# Patient Record
Sex: Male | Born: 1945 | Race: White | Hispanic: No | Marital: Married | Smoking: Current every day smoker
Health system: Southern US, Community
[De-identification: ages and names within clinical notes are randomized; demographics above are authoritative.]

## PROBLEM LIST (undated history)

## (undated) DIAGNOSIS — C320 Malignant neoplasm of glottis: Secondary | ICD-10-CM

## (undated) DIAGNOSIS — J449 Chronic obstructive pulmonary disease, unspecified: Secondary | ICD-10-CM

## (undated) DIAGNOSIS — A419 Sepsis, unspecified organism: Secondary | ICD-10-CM

## (undated) DIAGNOSIS — M549 Dorsalgia, unspecified: Secondary | ICD-10-CM

## (undated) DIAGNOSIS — C61 Malignant neoplasm of prostate: Secondary | ICD-10-CM

## (undated) DIAGNOSIS — M1990 Unspecified osteoarthritis, unspecified site: Secondary | ICD-10-CM

## (undated) DIAGNOSIS — G8929 Other chronic pain: Secondary | ICD-10-CM

## (undated) DIAGNOSIS — N390 Urinary tract infection, site not specified: Secondary | ICD-10-CM

## (undated) DIAGNOSIS — D751 Secondary polycythemia: Secondary | ICD-10-CM

## (undated) DIAGNOSIS — N200 Calculus of kidney: Secondary | ICD-10-CM

## (undated) HISTORY — PX: APPENDECTOMY: SHX54

## (undated) HISTORY — DX: Malignant neoplasm of glottis: C32.0

## (undated) HISTORY — PX: HERNIA REPAIR: SHX51

## (undated) HISTORY — PX: BACK SURGERY: SHX140

## (undated) HISTORY — DX: Malignant neoplasm of prostate: C61

## (undated) HISTORY — PX: NEPHRECTOMY: SHX65

## (undated) HISTORY — DX: Secondary polycythemia: D75.1

---

## 2013-04-14 DIAGNOSIS — D485 Neoplasm of uncertain behavior of skin: Secondary | ICD-10-CM | POA: Diagnosis not present

## 2013-04-14 DIAGNOSIS — L570 Actinic keratosis: Secondary | ICD-10-CM | POA: Diagnosis not present

## 2013-04-14 DIAGNOSIS — C44221 Squamous cell carcinoma of skin of unspecified ear and external auricular canal: Secondary | ICD-10-CM | POA: Diagnosis not present

## 2013-05-06 DIAGNOSIS — M48061 Spinal stenosis, lumbar region without neurogenic claudication: Secondary | ICD-10-CM | POA: Diagnosis not present

## 2013-05-06 DIAGNOSIS — R262 Difficulty in walking, not elsewhere classified: Secondary | ICD-10-CM | POA: Diagnosis not present

## 2013-05-06 DIAGNOSIS — N390 Urinary tract infection, site not specified: Secondary | ICD-10-CM | POA: Diagnosis not present

## 2013-05-06 DIAGNOSIS — M169 Osteoarthritis of hip, unspecified: Secondary | ICD-10-CM | POA: Diagnosis not present

## 2013-05-06 DIAGNOSIS — M25559 Pain in unspecified hip: Secondary | ICD-10-CM | POA: Diagnosis not present

## 2013-05-06 DIAGNOSIS — M5126 Other intervertebral disc displacement, lumbar region: Secondary | ICD-10-CM | POA: Diagnosis not present

## 2013-05-06 DIAGNOSIS — R079 Chest pain, unspecified: Secondary | ICD-10-CM | POA: Diagnosis not present

## 2013-05-07 DIAGNOSIS — R079 Chest pain, unspecified: Secondary | ICD-10-CM | POA: Diagnosis not present

## 2013-05-07 DIAGNOSIS — M25559 Pain in unspecified hip: Secondary | ICD-10-CM | POA: Diagnosis not present

## 2013-05-07 DIAGNOSIS — M48061 Spinal stenosis, lumbar region without neurogenic claudication: Secondary | ICD-10-CM | POA: Diagnosis not present

## 2013-05-07 DIAGNOSIS — M5126 Other intervertebral disc displacement, lumbar region: Secondary | ICD-10-CM | POA: Diagnosis not present

## 2013-05-07 DIAGNOSIS — N390 Urinary tract infection, site not specified: Secondary | ICD-10-CM | POA: Diagnosis not present

## 2013-05-13 DIAGNOSIS — N390 Urinary tract infection, site not specified: Secondary | ICD-10-CM | POA: Diagnosis not present

## 2013-05-13 DIAGNOSIS — R498 Other voice and resonance disorders: Secondary | ICD-10-CM | POA: Diagnosis not present

## 2013-05-13 DIAGNOSIS — M545 Low back pain: Secondary | ICD-10-CM | POA: Diagnosis not present

## 2013-05-25 DIAGNOSIS — M5137 Other intervertebral disc degeneration, lumbosacral region: Secondary | ICD-10-CM | POA: Diagnosis not present

## 2013-05-25 DIAGNOSIS — M48061 Spinal stenosis, lumbar region without neurogenic claudication: Secondary | ICD-10-CM | POA: Diagnosis not present

## 2013-05-25 DIAGNOSIS — M5380 Other specified dorsopathies, site unspecified: Secondary | ICD-10-CM | POA: Diagnosis not present

## 2013-05-25 DIAGNOSIS — M47817 Spondylosis without myelopathy or radiculopathy, lumbosacral region: Secondary | ICD-10-CM | POA: Diagnosis not present

## 2013-05-27 DIAGNOSIS — F17200 Nicotine dependence, unspecified, uncomplicated: Secondary | ICD-10-CM | POA: Diagnosis not present

## 2013-05-27 DIAGNOSIS — M5137 Other intervertebral disc degeneration, lumbosacral region: Secondary | ICD-10-CM | POA: Diagnosis not present

## 2013-05-27 DIAGNOSIS — Z79899 Other long term (current) drug therapy: Secondary | ICD-10-CM | POA: Diagnosis not present

## 2013-05-27 DIAGNOSIS — M47817 Spondylosis without myelopathy or radiculopathy, lumbosacral region: Secondary | ICD-10-CM | POA: Diagnosis not present

## 2013-05-27 DIAGNOSIS — M5380 Other specified dorsopathies, site unspecified: Secondary | ICD-10-CM | POA: Diagnosis not present

## 2013-06-03 DIAGNOSIS — M47817 Spondylosis without myelopathy or radiculopathy, lumbosacral region: Secondary | ICD-10-CM | POA: Diagnosis not present

## 2013-06-03 DIAGNOSIS — M5137 Other intervertebral disc degeneration, lumbosacral region: Secondary | ICD-10-CM | POA: Diagnosis not present

## 2013-06-08 DIAGNOSIS — M1990 Unspecified osteoarthritis, unspecified site: Secondary | ICD-10-CM | POA: Diagnosis not present

## 2013-06-08 DIAGNOSIS — G4700 Insomnia, unspecified: Secondary | ICD-10-CM | POA: Diagnosis not present

## 2013-06-08 DIAGNOSIS — M545 Low back pain: Secondary | ICD-10-CM | POA: Diagnosis not present

## 2013-06-22 DIAGNOSIS — M5137 Other intervertebral disc degeneration, lumbosacral region: Secondary | ICD-10-CM | POA: Diagnosis not present

## 2013-06-22 DIAGNOSIS — M47817 Spondylosis without myelopathy or radiculopathy, lumbosacral region: Secondary | ICD-10-CM | POA: Diagnosis not present

## 2013-06-22 DIAGNOSIS — F17200 Nicotine dependence, unspecified, uncomplicated: Secondary | ICD-10-CM | POA: Diagnosis not present

## 2013-06-22 DIAGNOSIS — Z79899 Other long term (current) drug therapy: Secondary | ICD-10-CM | POA: Diagnosis not present

## 2013-06-22 DIAGNOSIS — M51379 Other intervertebral disc degeneration, lumbosacral region without mention of lumbar back pain or lower extremity pain: Secondary | ICD-10-CM | POA: Diagnosis not present

## 2013-07-07 DIAGNOSIS — M47817 Spondylosis without myelopathy or radiculopathy, lumbosacral region: Secondary | ICD-10-CM | POA: Diagnosis not present

## 2013-07-07 DIAGNOSIS — M48061 Spinal stenosis, lumbar region without neurogenic claudication: Secondary | ICD-10-CM | POA: Diagnosis not present

## 2013-07-07 DIAGNOSIS — M5137 Other intervertebral disc degeneration, lumbosacral region: Secondary | ICD-10-CM | POA: Diagnosis not present

## 2013-07-07 DIAGNOSIS — M5380 Other specified dorsopathies, site unspecified: Secondary | ICD-10-CM | POA: Diagnosis not present

## 2013-07-23 DIAGNOSIS — M47817 Spondylosis without myelopathy or radiculopathy, lumbosacral region: Secondary | ICD-10-CM | POA: Diagnosis not present

## 2013-08-02 DIAGNOSIS — R69 Illness, unspecified: Secondary | ICD-10-CM | POA: Diagnosis not present

## 2013-08-02 DIAGNOSIS — M47817 Spondylosis without myelopathy or radiculopathy, lumbosacral region: Secondary | ICD-10-CM | POA: Diagnosis not present

## 2013-08-02 DIAGNOSIS — J984 Other disorders of lung: Secondary | ICD-10-CM | POA: Diagnosis not present

## 2013-08-02 DIAGNOSIS — Z01818 Encounter for other preprocedural examination: Secondary | ICD-10-CM | POA: Diagnosis not present

## 2013-08-02 DIAGNOSIS — Z538 Procedure and treatment not carried out for other reasons: Secondary | ICD-10-CM | POA: Diagnosis not present

## 2013-08-09 DIAGNOSIS — M545 Low back pain: Secondary | ICD-10-CM | POA: Diagnosis not present

## 2013-08-09 DIAGNOSIS — IMO0002 Reserved for concepts with insufficient information to code with codable children: Secondary | ICD-10-CM | POA: Diagnosis not present

## 2013-08-09 DIAGNOSIS — E860 Dehydration: Secondary | ICD-10-CM | POA: Diagnosis not present

## 2013-08-09 DIAGNOSIS — R627 Adult failure to thrive: Secondary | ICD-10-CM | POA: Diagnosis not present

## 2013-08-09 DIAGNOSIS — M549 Dorsalgia, unspecified: Secondary | ICD-10-CM | POA: Diagnosis not present

## 2013-08-09 DIAGNOSIS — Z79899 Other long term (current) drug therapy: Secondary | ICD-10-CM | POA: Diagnosis not present

## 2013-08-09 DIAGNOSIS — N390 Urinary tract infection, site not specified: Secondary | ICD-10-CM | POA: Diagnosis not present

## 2013-08-09 DIAGNOSIS — F17200 Nicotine dependence, unspecified, uncomplicated: Secondary | ICD-10-CM | POA: Diagnosis present

## 2013-08-09 DIAGNOSIS — I498 Other specified cardiac arrhythmias: Secondary | ICD-10-CM | POA: Diagnosis not present

## 2013-08-09 DIAGNOSIS — R5381 Other malaise: Secondary | ICD-10-CM | POA: Diagnosis not present

## 2013-08-09 DIAGNOSIS — Q043 Other reduction deformities of brain: Secondary | ICD-10-CM | POA: Diagnosis not present

## 2013-08-09 DIAGNOSIS — R17 Unspecified jaundice: Secondary | ICD-10-CM | POA: Diagnosis not present

## 2013-08-11 DIAGNOSIS — N476 Balanoposthitis: Secondary | ICD-10-CM | POA: Diagnosis not present

## 2013-08-11 DIAGNOSIS — N390 Urinary tract infection, site not specified: Secondary | ICD-10-CM | POA: Diagnosis not present

## 2013-08-27 DIAGNOSIS — N390 Urinary tract infection, site not specified: Secondary | ICD-10-CM | POA: Diagnosis not present

## 2013-08-27 DIAGNOSIS — Z23 Encounter for immunization: Secondary | ICD-10-CM | POA: Diagnosis not present

## 2013-08-27 DIAGNOSIS — J449 Chronic obstructive pulmonary disease, unspecified: Secondary | ICD-10-CM | POA: Diagnosis not present

## 2013-08-27 DIAGNOSIS — M545 Low back pain: Secondary | ICD-10-CM | POA: Diagnosis not present

## 2013-08-31 DIAGNOSIS — I059 Rheumatic mitral valve disease, unspecified: Secondary | ICD-10-CM | POA: Diagnosis not present

## 2013-08-31 DIAGNOSIS — Z01810 Encounter for preprocedural cardiovascular examination: Secondary | ICD-10-CM | POA: Diagnosis not present

## 2013-08-31 DIAGNOSIS — R9431 Abnormal electrocardiogram [ECG] [EKG]: Secondary | ICD-10-CM | POA: Diagnosis not present

## 2013-09-17 DIAGNOSIS — N476 Balanoposthitis: Secondary | ICD-10-CM | POA: Diagnosis not present

## 2013-09-17 DIAGNOSIS — R3915 Urgency of urination: Secondary | ICD-10-CM | POA: Diagnosis not present

## 2013-09-17 DIAGNOSIS — N390 Urinary tract infection, site not specified: Secondary | ICD-10-CM | POA: Diagnosis not present

## 2013-10-01 DIAGNOSIS — R3915 Urgency of urination: Secondary | ICD-10-CM | POA: Diagnosis not present

## 2013-10-08 DIAGNOSIS — R3915 Urgency of urination: Secondary | ICD-10-CM | POA: Diagnosis not present

## 2013-10-13 DIAGNOSIS — M47817 Spondylosis without myelopathy or radiculopathy, lumbosacral region: Secondary | ICD-10-CM | POA: Diagnosis not present

## 2013-10-13 DIAGNOSIS — M48061 Spinal stenosis, lumbar region without neurogenic claudication: Secondary | ICD-10-CM | POA: Diagnosis not present

## 2013-11-01 DIAGNOSIS — M519 Unspecified thoracic, thoracolumbar and lumbosacral intervertebral disc disorder: Secondary | ICD-10-CM | POA: Diagnosis not present

## 2013-11-01 DIAGNOSIS — Z01810 Encounter for preprocedural cardiovascular examination: Secondary | ICD-10-CM | POA: Diagnosis not present

## 2013-11-01 DIAGNOSIS — Z79899 Other long term (current) drug therapy: Secondary | ICD-10-CM | POA: Diagnosis not present

## 2013-11-01 DIAGNOSIS — F17200 Nicotine dependence, unspecified, uncomplicated: Secondary | ICD-10-CM | POA: Diagnosis not present

## 2013-11-01 DIAGNOSIS — N390 Urinary tract infection, site not specified: Secondary | ICD-10-CM | POA: Diagnosis not present

## 2013-11-01 DIAGNOSIS — M48061 Spinal stenosis, lumbar region without neurogenic claudication: Secondary | ICD-10-CM | POA: Diagnosis not present

## 2013-11-01 DIAGNOSIS — Z01818 Encounter for other preprocedural examination: Secondary | ICD-10-CM | POA: Diagnosis not present

## 2013-11-01 DIAGNOSIS — M47817 Spondylosis without myelopathy or radiculopathy, lumbosacral region: Secondary | ICD-10-CM | POA: Diagnosis not present

## 2013-11-01 DIAGNOSIS — M129 Arthropathy, unspecified: Secondary | ICD-10-CM | POA: Diagnosis not present

## 2013-11-04 DIAGNOSIS — M519 Unspecified thoracic, thoracolumbar and lumbosacral intervertebral disc disorder: Secondary | ICD-10-CM | POA: Diagnosis not present

## 2013-11-04 DIAGNOSIS — M48061 Spinal stenosis, lumbar region without neurogenic claudication: Secondary | ICD-10-CM | POA: Diagnosis not present

## 2013-11-04 DIAGNOSIS — M47817 Spondylosis without myelopathy or radiculopathy, lumbosacral region: Secondary | ICD-10-CM | POA: Diagnosis not present

## 2013-12-10 DIAGNOSIS — M1990 Unspecified osteoarthritis, unspecified site: Secondary | ICD-10-CM | POA: Diagnosis not present

## 2013-12-10 DIAGNOSIS — M545 Low back pain, unspecified: Secondary | ICD-10-CM | POA: Diagnosis not present

## 2013-12-10 DIAGNOSIS — N390 Urinary tract infection, site not specified: Secondary | ICD-10-CM | POA: Diagnosis not present

## 2014-02-14 DIAGNOSIS — R300 Dysuria: Secondary | ICD-10-CM | POA: Diagnosis not present

## 2014-02-14 DIAGNOSIS — Z87440 Personal history of urinary (tract) infections: Secondary | ICD-10-CM | POA: Diagnosis not present

## 2014-02-15 DIAGNOSIS — R300 Dysuria: Secondary | ICD-10-CM | POA: Diagnosis not present

## 2014-03-07 DIAGNOSIS — N390 Urinary tract infection, site not specified: Secondary | ICD-10-CM | POA: Diagnosis not present

## 2014-03-07 DIAGNOSIS — R3915 Urgency of urination: Secondary | ICD-10-CM | POA: Diagnosis not present

## 2014-03-08 DIAGNOSIS — R3915 Urgency of urination: Secondary | ICD-10-CM | POA: Diagnosis not present

## 2014-03-18 DIAGNOSIS — N390 Urinary tract infection, site not specified: Secondary | ICD-10-CM | POA: Diagnosis not present

## 2014-03-18 DIAGNOSIS — N200 Calculus of kidney: Secondary | ICD-10-CM | POA: Diagnosis not present

## 2014-04-06 DIAGNOSIS — R3915 Urgency of urination: Secondary | ICD-10-CM | POA: Diagnosis not present

## 2014-04-06 DIAGNOSIS — N200 Calculus of kidney: Secondary | ICD-10-CM | POA: Diagnosis not present

## 2014-04-06 DIAGNOSIS — Z87440 Personal history of urinary (tract) infections: Secondary | ICD-10-CM | POA: Diagnosis not present

## 2014-04-07 DIAGNOSIS — I714 Abdominal aortic aneurysm, without rupture, unspecified: Secondary | ICD-10-CM | POA: Diagnosis not present

## 2014-04-07 DIAGNOSIS — F17200 Nicotine dependence, unspecified, uncomplicated: Secondary | ICD-10-CM | POA: Diagnosis not present

## 2014-04-07 DIAGNOSIS — Z87440 Personal history of urinary (tract) infections: Secondary | ICD-10-CM | POA: Diagnosis not present

## 2014-04-07 DIAGNOSIS — Z8052 Family history of malignant neoplasm of bladder: Secondary | ICD-10-CM | POA: Diagnosis not present

## 2014-04-07 DIAGNOSIS — Z833 Family history of diabetes mellitus: Secondary | ICD-10-CM | POA: Diagnosis not present

## 2014-04-07 DIAGNOSIS — N200 Calculus of kidney: Secondary | ICD-10-CM | POA: Diagnosis not present

## 2014-04-07 DIAGNOSIS — R3915 Urgency of urination: Secondary | ICD-10-CM | POA: Diagnosis not present

## 2014-04-07 DIAGNOSIS — Z8051 Family history of malignant neoplasm of kidney: Secondary | ICD-10-CM | POA: Diagnosis not present

## 2014-04-08 DIAGNOSIS — N200 Calculus of kidney: Secondary | ICD-10-CM | POA: Diagnosis not present

## 2014-04-18 DIAGNOSIS — Z87440 Personal history of urinary (tract) infections: Secondary | ICD-10-CM | POA: Diagnosis not present

## 2014-04-18 DIAGNOSIS — J449 Chronic obstructive pulmonary disease, unspecified: Secondary | ICD-10-CM | POA: Diagnosis not present

## 2014-04-18 DIAGNOSIS — N200 Calculus of kidney: Secondary | ICD-10-CM | POA: Diagnosis not present

## 2014-04-18 DIAGNOSIS — N189 Chronic kidney disease, unspecified: Secondary | ICD-10-CM | POA: Diagnosis not present

## 2014-04-18 DIAGNOSIS — IMO0002 Reserved for concepts with insufficient information to code with codable children: Secondary | ICD-10-CM | POA: Diagnosis not present

## 2014-04-21 DIAGNOSIS — Z87440 Personal history of urinary (tract) infections: Secondary | ICD-10-CM | POA: Diagnosis not present

## 2014-04-21 DIAGNOSIS — IMO0002 Reserved for concepts with insufficient information to code with codable children: Secondary | ICD-10-CM | POA: Diagnosis not present

## 2014-04-21 DIAGNOSIS — J449 Chronic obstructive pulmonary disease, unspecified: Secondary | ICD-10-CM | POA: Diagnosis not present

## 2014-04-21 DIAGNOSIS — N189 Chronic kidney disease, unspecified: Secondary | ICD-10-CM | POA: Diagnosis not present

## 2014-04-21 DIAGNOSIS — N200 Calculus of kidney: Secondary | ICD-10-CM | POA: Diagnosis not present

## 2014-04-22 DIAGNOSIS — IMO0002 Reserved for concepts with insufficient information to code with codable children: Secondary | ICD-10-CM | POA: Diagnosis not present

## 2014-04-22 DIAGNOSIS — N189 Chronic kidney disease, unspecified: Secondary | ICD-10-CM | POA: Diagnosis not present

## 2014-04-22 DIAGNOSIS — J449 Chronic obstructive pulmonary disease, unspecified: Secondary | ICD-10-CM | POA: Diagnosis not present

## 2014-04-22 DIAGNOSIS — Z87440 Personal history of urinary (tract) infections: Secondary | ICD-10-CM | POA: Diagnosis not present

## 2014-04-22 DIAGNOSIS — N200 Calculus of kidney: Secondary | ICD-10-CM | POA: Diagnosis not present

## 2014-04-27 DIAGNOSIS — M47817 Spondylosis without myelopathy or radiculopathy, lumbosacral region: Secondary | ICD-10-CM | POA: Diagnosis not present

## 2014-04-27 DIAGNOSIS — M48061 Spinal stenosis, lumbar region without neurogenic claudication: Secondary | ICD-10-CM | POA: Diagnosis not present

## 2014-05-10 DIAGNOSIS — N289 Disorder of kidney and ureter, unspecified: Secondary | ICD-10-CM | POA: Diagnosis not present

## 2014-05-10 DIAGNOSIS — Z87440 Personal history of urinary (tract) infections: Secondary | ICD-10-CM | POA: Diagnosis not present

## 2014-05-10 DIAGNOSIS — N270 Small kidney, unilateral: Secondary | ICD-10-CM | POA: Diagnosis present

## 2014-05-10 DIAGNOSIS — F17200 Nicotine dependence, unspecified, uncomplicated: Secondary | ICD-10-CM | POA: Diagnosis present

## 2014-05-10 DIAGNOSIS — N189 Chronic kidney disease, unspecified: Secondary | ICD-10-CM | POA: Diagnosis present

## 2014-05-10 DIAGNOSIS — Z87442 Personal history of urinary calculi: Secondary | ICD-10-CM | POA: Diagnosis not present

## 2014-05-10 DIAGNOSIS — N200 Calculus of kidney: Secondary | ICD-10-CM | POA: Diagnosis not present

## 2014-06-08 DIAGNOSIS — N151 Renal and perinephric abscess: Secondary | ICD-10-CM | POA: Diagnosis not present

## 2014-06-08 DIAGNOSIS — N200 Calculus of kidney: Secondary | ICD-10-CM | POA: Diagnosis not present

## 2014-06-08 DIAGNOSIS — Z48816 Encounter for surgical aftercare following surgery on the genitourinary system: Secondary | ICD-10-CM | POA: Diagnosis not present

## 2014-06-24 DIAGNOSIS — N390 Urinary tract infection, site not specified: Secondary | ICD-10-CM | POA: Diagnosis not present

## 2014-06-24 DIAGNOSIS — F411 Generalized anxiety disorder: Secondary | ICD-10-CM | POA: Diagnosis not present

## 2014-06-24 DIAGNOSIS — M545 Low back pain, unspecified: Secondary | ICD-10-CM | POA: Diagnosis not present

## 2014-08-08 DIAGNOSIS — N200 Calculus of kidney: Secondary | ICD-10-CM | POA: Diagnosis not present

## 2014-08-08 DIAGNOSIS — N289 Disorder of kidney and ureter, unspecified: Secondary | ICD-10-CM | POA: Diagnosis not present

## 2014-08-15 DIAGNOSIS — M4806 Spinal stenosis, lumbar region: Secondary | ICD-10-CM | POA: Diagnosis not present

## 2014-08-15 DIAGNOSIS — M47816 Spondylosis without myelopathy or radiculopathy, lumbar region: Secondary | ICD-10-CM | POA: Diagnosis not present

## 2014-09-09 DIAGNOSIS — J449 Chronic obstructive pulmonary disease, unspecified: Secondary | ICD-10-CM | POA: Diagnosis not present

## 2014-09-09 DIAGNOSIS — F419 Anxiety disorder, unspecified: Secondary | ICD-10-CM | POA: Diagnosis not present

## 2014-09-09 DIAGNOSIS — M545 Low back pain: Secondary | ICD-10-CM | POA: Diagnosis not present

## 2014-09-09 DIAGNOSIS — Z23 Encounter for immunization: Secondary | ICD-10-CM | POA: Diagnosis not present

## 2014-09-09 DIAGNOSIS — M159 Polyosteoarthritis, unspecified: Secondary | ICD-10-CM | POA: Diagnosis not present

## 2014-09-15 ENCOUNTER — Emergency Department (HOSPITAL_COMMUNITY): Payer: Medicare Other

## 2014-09-15 ENCOUNTER — Inpatient Hospital Stay (HOSPITAL_COMMUNITY): Payer: Medicare Other

## 2014-09-15 ENCOUNTER — Encounter (HOSPITAL_COMMUNITY): Payer: Self-pay

## 2014-09-15 ENCOUNTER — Inpatient Hospital Stay (HOSPITAL_COMMUNITY)
Admission: EM | Admit: 2014-09-15 | Discharge: 2014-09-20 | DRG: 871 | Disposition: A | Discharge status: Home / Self Care | Payer: Medicare Other | Attending: Pulmonary Disease | Admitting: Pulmonary Disease

## 2014-09-15 DIAGNOSIS — A419 Sepsis, unspecified organism: Principal | ICD-10-CM | POA: Diagnosis present

## 2014-09-15 DIAGNOSIS — Z87442 Personal history of urinary calculi: Secondary | ICD-10-CM

## 2014-09-15 DIAGNOSIS — G8929 Other chronic pain: Secondary | ICD-10-CM | POA: Diagnosis present

## 2014-09-15 DIAGNOSIS — N179 Acute kidney failure, unspecified: Secondary | ICD-10-CM

## 2014-09-15 DIAGNOSIS — B9620 Unspecified Escherichia coli [E. coli] as the cause of diseases classified elsewhere: Secondary | ICD-10-CM | POA: Diagnosis present

## 2014-09-15 DIAGNOSIS — R410 Disorientation, unspecified: Secondary | ICD-10-CM

## 2014-09-15 DIAGNOSIS — I369 Nonrheumatic tricuspid valve disorder, unspecified: Secondary | ICD-10-CM | POA: Diagnosis not present

## 2014-09-15 DIAGNOSIS — Z905 Acquired absence of kidney: Secondary | ICD-10-CM | POA: Diagnosis present

## 2014-09-15 DIAGNOSIS — G9340 Encephalopathy, unspecified: Secondary | ICD-10-CM | POA: Diagnosis not present

## 2014-09-15 DIAGNOSIS — G9341 Metabolic encephalopathy: Secondary | ICD-10-CM | POA: Diagnosis present

## 2014-09-15 DIAGNOSIS — J811 Chronic pulmonary edema: Secondary | ICD-10-CM | POA: Diagnosis not present

## 2014-09-15 DIAGNOSIS — E860 Dehydration: Secondary | ICD-10-CM | POA: Diagnosis not present

## 2014-09-15 DIAGNOSIS — E871 Hypo-osmolality and hyponatremia: Secondary | ICD-10-CM | POA: Diagnosis present

## 2014-09-15 DIAGNOSIS — R509 Fever, unspecified: Secondary | ICD-10-CM | POA: Diagnosis not present

## 2014-09-15 DIAGNOSIS — Z87440 Personal history of urinary (tract) infections: Secondary | ICD-10-CM | POA: Diagnosis not present

## 2014-09-15 DIAGNOSIS — D696 Thrombocytopenia, unspecified: Secondary | ICD-10-CM | POA: Diagnosis present

## 2014-09-15 DIAGNOSIS — I214 Non-ST elevation (NSTEMI) myocardial infarction: Secondary | ICD-10-CM | POA: Diagnosis present

## 2014-09-15 DIAGNOSIS — Z79899 Other long term (current) drug therapy: Secondary | ICD-10-CM

## 2014-09-15 DIAGNOSIS — I2129 ST elevation (STEMI) myocardial infarction involving other sites: Secondary | ICD-10-CM | POA: Diagnosis not present

## 2014-09-15 DIAGNOSIS — R531 Weakness: Secondary | ICD-10-CM | POA: Diagnosis not present

## 2014-09-15 DIAGNOSIS — J449 Chronic obstructive pulmonary disease, unspecified: Secondary | ICD-10-CM | POA: Diagnosis present

## 2014-09-15 DIAGNOSIS — A4151 Sepsis due to Escherichia coli [E. coli]: Secondary | ICD-10-CM

## 2014-09-15 DIAGNOSIS — N400 Enlarged prostate without lower urinary tract symptoms: Secondary | ICD-10-CM | POA: Diagnosis present

## 2014-09-15 DIAGNOSIS — I129 Hypertensive chronic kidney disease with stage 1 through stage 4 chronic kidney disease, or unspecified chronic kidney disease: Secondary | ICD-10-CM | POA: Diagnosis present

## 2014-09-15 DIAGNOSIS — F4542 Pain disorder with related psychological factors: Secondary | ICD-10-CM | POA: Diagnosis present

## 2014-09-15 DIAGNOSIS — N200 Calculus of kidney: Secondary | ICD-10-CM | POA: Diagnosis present

## 2014-09-15 DIAGNOSIS — M549 Dorsalgia, unspecified: Secondary | ICD-10-CM | POA: Diagnosis present

## 2014-09-15 DIAGNOSIS — D649 Anemia, unspecified: Secondary | ICD-10-CM | POA: Diagnosis not present

## 2014-09-15 DIAGNOSIS — R778 Other specified abnormalities of plasma proteins: Secondary | ICD-10-CM | POA: Diagnosis present

## 2014-09-15 DIAGNOSIS — N390 Urinary tract infection, site not specified: Secondary | ICD-10-CM | POA: Diagnosis not present

## 2014-09-15 DIAGNOSIS — I248 Other forms of acute ischemic heart disease: Secondary | ICD-10-CM | POA: Diagnosis present

## 2014-09-15 DIAGNOSIS — E875 Hyperkalemia: Secondary | ICD-10-CM | POA: Diagnosis present

## 2014-09-15 DIAGNOSIS — N19 Unspecified kidney failure: Secondary | ICD-10-CM | POA: Diagnosis not present

## 2014-09-15 DIAGNOSIS — N183 Chronic kidney disease, stage 3 (moderate): Secondary | ICD-10-CM | POA: Diagnosis present

## 2014-09-15 DIAGNOSIS — F17210 Nicotine dependence, cigarettes, uncomplicated: Secondary | ICD-10-CM | POA: Diagnosis present

## 2014-09-15 DIAGNOSIS — R7989 Other specified abnormal findings of blood chemistry: Secondary | ICD-10-CM

## 2014-09-15 HISTORY — DX: Other chronic pain: G89.29

## 2014-09-15 HISTORY — DX: Urinary tract infection, site not specified: N39.0

## 2014-09-15 HISTORY — DX: Calculus of kidney: N20.0

## 2014-09-15 HISTORY — DX: Sepsis, unspecified organism: A41.9

## 2014-09-15 HISTORY — DX: Dorsalgia, unspecified: M54.9

## 2014-09-15 HISTORY — DX: Hypercalcemia: E83.52

## 2014-09-15 LAB — RAPID URINE DRUG SCREEN, HOSP PERFORMED
Amphetamines: NOT DETECTED
BARBITURATES: NOT DETECTED
Benzodiazepines: POSITIVE — AB
COCAINE: NOT DETECTED
Opiates: NOT DETECTED
Tetrahydrocannabinol: NOT DETECTED

## 2014-09-15 LAB — BASIC METABOLIC PANEL
Anion gap: 14 (ref 5–15)
BUN: 64 mg/dL — AB (ref 6–23)
CHLORIDE: 101 meq/L (ref 96–112)
CO2: 21 mEq/L (ref 19–32)
Calcium: 10.8 mg/dL — ABNORMAL HIGH (ref 8.4–10.5)
Creatinine, Ser: 4.63 mg/dL — ABNORMAL HIGH (ref 0.50–1.35)
GFR calc Af Amer: 14 mL/min — ABNORMAL LOW (ref >90)
GFR calc non Af Amer: 12 mL/min — ABNORMAL LOW (ref >90)
GLUCOSE: 112 mg/dL — AB (ref 70–99)
POTASSIUM: 5 meq/L (ref 3.7–5.3)
SODIUM: 136 meq/L — AB (ref 137–147)

## 2014-09-15 LAB — TROPONIN I
TROPONIN I: 0.81 ng/mL — AB (ref <0.30)
TROPONIN I: 2.06 ng/mL — AB (ref <0.30)
Troponin I: 1.73 ng/mL (ref <0.30)

## 2014-09-15 LAB — CBC WITH DIFFERENTIAL/PLATELET
BASOS PCT: 0 % (ref 0–1)
Basophils Absolute: 0 10*3/uL (ref 0.0–0.1)
Eosinophils Absolute: 0 10*3/uL (ref 0.0–0.7)
Eosinophils Relative: 0 % (ref 0–5)
HEMATOCRIT: 39.1 % (ref 39.0–52.0)
Hemoglobin: 13.8 g/dL (ref 13.0–17.0)
LYMPHS ABS: 0.2 10*3/uL — AB (ref 0.7–4.0)
LYMPHS PCT: 2 % — AB (ref 12–46)
MCH: 30.7 pg (ref 26.0–34.0)
MCHC: 35.3 g/dL (ref 30.0–36.0)
MCV: 86.9 fL (ref 78.0–100.0)
MONO ABS: 0.3 10*3/uL (ref 0.1–1.0)
MONOS PCT: 4 % (ref 3–12)
NEUTROS PCT: 94 % — AB (ref 43–77)
Neutro Abs: 7.1 10*3/uL (ref 1.7–7.7)
Platelets: 61 10*3/uL — ABNORMAL LOW (ref 150–400)
RBC: 4.5 MIL/uL (ref 4.22–5.81)
RDW: 14.5 % (ref 11.5–15.5)
Smear Review: DECREASED
WBC: 7.5 10*3/uL (ref 4.0–10.5)

## 2014-09-15 LAB — URINALYSIS, ROUTINE W REFLEX MICROSCOPIC
Glucose, UA: NEGATIVE mg/dL
KETONES UR: NEGATIVE mg/dL
NITRITE: NEGATIVE
Protein, ur: 100 mg/dL — AB
SPECIFIC GRAVITY, URINE: 1.02 (ref 1.005–1.030)
Urobilinogen, UA: 1 mg/dL (ref 0.0–1.0)
pH: 5.5 (ref 5.0–8.0)

## 2014-09-15 LAB — TSH: TSH: 1.03 u[IU]/mL (ref 0.350–4.500)

## 2014-09-15 LAB — LACTIC ACID, PLASMA: LACTIC ACID, VENOUS: 1.4 mmol/L (ref 0.5–2.2)

## 2014-09-15 LAB — URINE MICROSCOPIC-ADD ON

## 2014-09-15 LAB — MRSA PCR SCREENING: MRSA BY PCR: POSITIVE — AB

## 2014-09-15 MED ORDER — SODIUM CHLORIDE 0.9 % IV SOLN
INTRAVENOUS | Status: DC
  Administered 2014-09-15: 10:00:00 via INTRAVENOUS

## 2014-09-15 MED ORDER — ACETAMINOPHEN 650 MG RE SUPP: 650.0000 mg | Freq: Four times a day (QID) | RECTAL | Status: DC | PRN

## 2014-09-15 MED ORDER — DEXTROSE 5 % IV SOLN
1.0000 g | Freq: Once | INTRAVENOUS | Status: AC
  Administered 2014-09-15: 1 g via INTRAVENOUS
  Filled 2014-09-15: qty 10

## 2014-09-15 MED ORDER — DOCUSATE SODIUM 100 MG PO CAPS
100.0000 mg | ORAL_CAPSULE | Freq: Two times a day (BID) | ORAL | Status: DC
  Administered 2014-09-15 – 2014-09-20 (×8): 100 mg via ORAL
  Filled 2014-09-15 (×8): qty 1

## 2014-09-15 MED ORDER — ALUM & MAG HYDROXIDE-SIMETH 200-200-20 MG/5ML PO SUSP
30.0000 mL | Freq: Four times a day (QID) | ORAL | Status: DC | PRN
  Administered 2014-09-16 (×2): 30 mL via ORAL
  Filled 2014-09-15 (×2): qty 30

## 2014-09-15 MED ORDER — PIPERACILLIN-TAZOBACTAM IN DEX 2-0.25 GM/50ML IV SOLN
2.2500 g | Freq: Three times a day (TID) | INTRAVENOUS | Status: DC
  Administered 2014-09-15 – 2014-09-17 (×6): 2.25 g via INTRAVENOUS
  Filled 2014-09-15 (×13): qty 50

## 2014-09-15 MED ORDER — SODIUM CHLORIDE 0.9 % IV BOLUS (SEPSIS)
250.0000 mL | Freq: Once | INTRAVENOUS | Status: AC
  Administered 2014-09-15: 250 mL via INTRAVENOUS

## 2014-09-15 MED ORDER — TAMSULOSIN HCL 0.4 MG PO CAPS
0.4000 mg | ORAL_CAPSULE | Freq: Every day | ORAL | Status: DC
  Administered 2014-09-15 – 2014-09-20 (×6): 0.4 mg via ORAL
  Filled 2014-09-15 (×6): qty 1

## 2014-09-15 MED ORDER — OXYCODONE HCL 5 MG PO TABS
10.0000 mg | ORAL_TABLET | Freq: Two times a day (BID) | ORAL | Status: DC | PRN
  Administered 2014-09-15 – 2014-09-17 (×4): 10 mg via ORAL
  Filled 2014-09-15 (×5): qty 2

## 2014-09-15 MED ORDER — BISACODYL 10 MG RE SUPP: 10.0000 mg | Freq: Every day | RECTAL | Status: DC | PRN

## 2014-09-15 MED ORDER — CETYLPYRIDINIUM CHLORIDE 0.05 % MT LIQD
7.0000 mL | Freq: Two times a day (BID) | OROMUCOSAL | Status: DC
  Administered 2014-09-15 – 2014-09-20 (×10): 7 mL via OROMUCOSAL

## 2014-09-15 MED ORDER — ONDANSETRON HCL 4 MG/2ML IJ SOLN: 4.0000 mg | Freq: Four times a day (QID) | INTRAMUSCULAR | Status: DC | PRN

## 2014-09-15 MED ORDER — CHLORHEXIDINE GLUCONATE CLOTH 2 % EX PADS
6.0000 | MEDICATED_PAD | Freq: Every day | CUTANEOUS | Status: AC
  Administered 2014-09-16 – 2014-09-20 (×5): 6 via TOPICAL

## 2014-09-15 MED ORDER — SODIUM CHLORIDE 0.9 % IV SOLN
INTRAVENOUS | Status: DC
  Administered 2014-09-15 (×2): via INTRAVENOUS
  Administered 2014-09-16: 1000 mL via INTRAVENOUS
  Administered 2014-09-16 – 2014-09-17 (×3): via INTRAVENOUS
  Administered 2014-09-17: 1000 mL via INTRAVENOUS
  Administered 2014-09-18 – 2014-09-19 (×4): via INTRAVENOUS

## 2014-09-15 MED ORDER — MUPIROCIN 2 % EX OINT
1.0000 "application " | TOPICAL_OINTMENT | Freq: Two times a day (BID) | CUTANEOUS | Status: AC
  Administered 2014-09-15 – 2014-09-19 (×10): 1 via NASAL
  Filled 2014-09-15 (×3): qty 22

## 2014-09-15 MED ORDER — ASPIRIN 81 MG PO CHEW
324.0000 mg | CHEWABLE_TABLET | Freq: Once | ORAL | Status: AC
  Administered 2014-09-15: 324 mg via ORAL
  Filled 2014-09-15: qty 4

## 2014-09-15 MED ORDER — SODIUM CHLORIDE 0.9 % IJ SOLN
3.0000 mL | Freq: Two times a day (BID) | INTRAMUSCULAR | Status: DC
  Administered 2014-09-15 – 2014-09-20 (×8): 3 mL via INTRAVENOUS

## 2014-09-15 MED ORDER — ONDANSETRON HCL 4 MG PO TABS
4.0000 mg | ORAL_TABLET | Freq: Four times a day (QID) | ORAL | Status: DC | PRN
Start: 2014-09-15 — End: 2014-09-20

## 2014-09-15 MED ORDER — ACETAMINOPHEN 650 MG RE SUPP
650.0000 mg | Freq: Once | RECTAL | Status: AC
  Administered 2014-09-15: 650 mg via RECTAL
  Filled 2014-09-15: qty 1

## 2014-09-15 MED ORDER — CEFTRIAXONE SODIUM IN DEXTROSE 20 MG/ML IV SOLN
1.0000 g | INTRAVENOUS | Status: DC
  Filled 2014-09-15: qty 50

## 2014-09-15 MED ORDER — SODIUM CHLORIDE 0.9 % IV BOLUS (SEPSIS)
500.0000 mL | Freq: Once | INTRAVENOUS | Status: AC
  Administered 2014-09-15: 500 mL via INTRAVENOUS

## 2014-09-15 MED ORDER — ACETAMINOPHEN 325 MG PO TABS
650.0000 mg | ORAL_TABLET | Freq: Four times a day (QID) | ORAL | Status: DC | PRN
  Administered 2014-09-19: 650 mg via ORAL
  Filled 2014-09-15: qty 2

## 2014-09-15 NOTE — H&P (Signed)
[facility] History and Physical  Nikitas Davtyan TGG:[id number] DOB: 17-Oct-1946 DOA: 09/15/2014  Referring physician:  PCP: Alonza Bogus, MD   Chief Complaint: weakness/ams/UTI  HPI: Ivan [name] is a 68 y.o. male with a past medical history that includes recurrent urinary tract infection, chronic back pain for which he takes daily pain meds, kidney stones status post nephrectomy presents to the emergency department with the chief complaint of generalized weakness, altered mental status, decreased by mouth intake. Initial evaluation reveals urinary tract infection with sepsis, acute renal failure and elevated troponin. Wife reports 3 day history of worsening generalized weakness decreased appetite and decreased by mouth intake. Home UTI test positive PCP prescribed antibiotics 3 days ago. Associated symptoms include nausea with eating but no vomiting subjective fever with chills and recent fall without injury. Addition 2 days ago he had the hiccups for most of the day and today complains of some mild pain upper abdomen with movement. He denies chest pain palpitation headache visual disturbances diarrhea. Workup in the emergency department significant for sodium level the 136, platelets 61 BUN 64 creatinine 4.63 calcium 10.8 serum glucose 112. Distal troponin 0.81. Chest x-ray yields central venous pulmonary congestion. No focal infiltrate. Urinalysis with many bacteria WBCs and RBCs too numerous to count moderate leukocytes. EKG with sinus rhythm and short PR interval. At the time of my exam he is awake but lethargic afebrile with a slightly soft blood pressure. He is not hypoxic.  Review of Systems:  10 point review of systems complete and all systems are negative except as indicated by history of present illness  Past Medical History  Diagnosis Date  . UTI (lower urinary tract infection)   . Chronic back pain   . Kidney stones   . Hypercalcemia    Past Surgical History    Procedure Laterality Date  . Nephrectomy      04/2014  . Back surgery      10/2013   Social History:  reports that he has been smoking.  He does not have any smokeless tobacco history on file. He reports that he does not drink alcohol or use illicit drugs. Is married lives at home with his wife he is employed as a Building control surveyor he is independent with ADLs No Known Allergies  No family history on file. family medical history reviewed and noncontributory to the admission of this gentleman  Prior to Admission medications   Medication Sig Start Date End Date Taking? Authorizing Provider  ALPRAZolam Ivan [name]) 1 MG tablet Take 1 mg by mouth at bedtime as needed for sleep.  08/15/14  Yes Historical Provider, MD  B Complex-C (B-COMPLEX WITH VITAMIN C) tablet Take 1 tablet by mouth daily.   Yes Historical Provider, MD  Multiple Vitamin (MULTIVITAMIN WITH MINERALS) TABS tablet Take 1 tablet by mouth daily.   Yes Historical Provider, MD  ondansetron (ZOFRAN-ODT) 4 MG disintegrating tablet Take 1 tablet by mouth every 8 (eight) hours as needed for nausea or vomiting.  08/15/14  Yes Historical Provider, MD  Oxycodone HCl 10 MG TABS Take 1 tablet by mouth 2 (two) times daily as needed (pain).  08/15/14  Yes Historical Provider, MD  polyethylene glycol (MIRALAX / GLYCOLAX) packet Take 17 g by mouth daily as needed.   Yes Historical Provider, MD  Potassium 95 MG TABS Take 1 tablet by mouth daily.   Yes Historical Provider, MD  sulfamethoxazole-trimethoprim (BACTRIM DS,SEPTRA DS) 800-160 MG per tablet Take 1 tablet by mouth 2 (two) times daily. Starting 09/12/2014 x  10 days. 09/12/14  Yes Historical Provider, MD  tamsulosin (FLOMAX) 0.4 MG CAPS capsule Take 0.4 mg by mouth daily. 08/28/14  Yes Historical Provider, MD   Physical Exam: Filed Vitals:   09/15/14 1008 09/15/14 1059 09/15/14 1113 09/15/14 1130  BP: 100/70 94/60 95/64  91/61  Pulse: 97 81 79 75  Temp:   98.5 F (36.9 C)   TempSrc:   Oral   Resp: 21 21  23 23   Height:      Weight:      SpO2: 95% 97% 97% 95%    Wt Readings from Last 3 Encounters:  09/15/14 65.772 kg (145 lb)    General:  Appears calm and comfortable somewhat lethargic Eyes: PERRL, normal lids, irises & conjunctiva ENT: grossly normal hearing, mucous membranes of his mouth are dry but pink very poor dentition Neck: no LAD, masses or thyromegaly Cardiovascular: RRR, no m/r/g. No LE edema. Pulses present Respiratory: Fine crackles particularly on left base otherwise breath sounds diminished no wheezing normal effort Abdomen: soft, ntnd positive bowel sounds Skin: no rash or induration seen on limited exam Musculoskeletal: grossly normal tone BUE/BLE joints without swelling/erythema Psychiatric: grossly normal mood and affect, speech fluent and appropriate Neurologic: grossly non-focal. Awake but lethargic oriented to person and place attempts to follow commands but slow speech clear           Labs on Admission:  Basic Metabolic Panel:  Recent Labs Lab 09/15/14 0921  NA 136*  K 5.0  CL 101  CO2 21  GLUCOSE 112*  BUN 64*  CREATININE 4.63*  CALCIUM 10.8*   Liver Function Tests: No results for input(s): AST, ALT, ALKPHOS, BILITOT, PROT, ALBUMIN in the last 168 hours. No results for input(s): LIPASE, AMYLASE in the last 168 hours. No results for input(s): AMMONIA in the last 168 hours. CBC:  Recent Labs Lab 09/15/14 0921  WBC 7.5  NEUTROABS 7.1  HGB 13.8  HCT 39.1  MCV 86.9  PLT 61*   Cardiac Enzymes:  Recent Labs Lab 09/15/14 0921  TROPONINI 0.81*    BNP (last 3 results) No results for input(s): PROBNP in the last 8760 hours. CBG: No results for input(s): GLUCAP in the last 168 hours.  Radiological Exams on Admission: Dg Chest Port 1 View  09/15/2014   CLINICAL DATA:  Urinary tract infection  EXAM: PORTABLE CHEST - 1 VIEW  COMPARISON:  None.  FINDINGS: Normal cardiothymic silhouette. Mild central venous pulmonary congestion. No  effusion, infiltrate, or pneumothorax.  IMPRESSION: Central venous pulmonary congestion.  No focal infiltrate.   Electronically Signed   By: Suzy Bouchard M.D.   On: 09/15/2014 09:32    EKG: Independently reviewed.SR with short PR interval  Assessment/Plan Principal Problem:  Sepsis: Related to urinary tract infection. Chart review indicates patient's history of recurrent urinary tract infection. Blood cultures pending. Currently is afebrile with no leukocytosis. He was seen by urology at [facility] October of this year urine culture at that time yields Escherichia coli susceptible to Rocephin. Will continue Rocephin. Will gently hydrate. Monitor closely on the stepdown unit.     Acute renal failure: Likely multifactorial i.e. dehydration in setting of recurrent UTI in patient status post nephrectomy secondary to kidney stones. Will admit to step down. Will gently hydrate with IV fluids. Will hold any nephrotoxins. Chart review indicates patient seen by urology at [facility] October of this year and his creatinine level was 1.25 at that time. Will obtain renal ultrasound. Will request nephrology consult  Active Problems:  Acute encephalopathy: Related to #1 #2 in the setting of UTI. See above therapies. No focal deficits. Patient's home medications to include Xanax and oxycodone. Will hold these for now    UTI (lower urinary tract infection): See above. Rocephin per pharmacy. Currently is afebrile with no leukocytosis. Urine culture done at [facility] last month yields Escherichia coli susceptible to Rocephin.    Dehydration: Decreased by mouth intake due to acute illness. Will gently hydrate.    Elevated troponin: No history of CAD but he is a smoker. He denies any chest pain. I suspect this is more related to acute renal failure. We'll cycle troponins monitor on telemetry repeat EKG in the morning. Was given aspirin 325 mg in the emergency department.    Thrombocytopenia: Etiology unclear.  No signs symptoms of bleeding. History of EtOH.    Hyponatremia: I'll likely related to dehydration. Will hydrate with IV fluids and recheck in the morning      Code Status: full DVT Prophylaxis: Family Communication: wife and brother at home Disposition Plan: home when ready  Time spent: 33 minutes  [facility] Pager [phone]

## 2014-09-15 NOTE — ED Notes (Signed)
CRITICAL VALUE ALERT  Critical value received:  Troponin 0.81  Date of notification:  09/15/2014  Time of notification:  0931  Critical value read back:Yes.    Nurse who received alert:  [facility] rn  MD notified (1st page):  Dr. Thurnell Garbe  Time of first page:  1014  MD notified (2nd page):  Time of second page:  Responding MD:  Dr. Thurnell Garbe  Time MD responded:  1014

## 2014-09-15 NOTE — ED Notes (Signed)
Report given to ICU. Pt ready for transfer. 

## 2014-09-15 NOTE — ED Notes (Signed)
Fall risk bracelet and yellow socks placed on pt. Family at bedside.

## 2014-09-15 NOTE — Progress Notes (Signed)
CRITICAL VALUE ALERT  Critical value received: MRSA positive Date of notification:  09/15/2014  Time of notification:  5830  Critical value read back:Yes.    Nurse who received alert:  Sharene Skeans, RN   MD notified (1st page):  Note in chart for Dr. Luan Pulling, protocol started per standing order  Time of first page:  1400  MD notified (2nd page):  Time of second page:  Responding MD:  Luan Pulling  Time MD responded:

## 2014-09-15 NOTE — Progress Notes (Signed)
Troponin lab result 1.73 found by nurse, result was not called by lab.  However, result was called to MD.  New orders for cardiology received and carried out.  Schonewitz, Eulis Canner 09/15/2014

## 2014-09-15 NOTE — Consult Note (Signed)
Reason for Consult: Acute kidney injury Referring Physician: Dr. Velda Shell Stickels is an 68 y.o. male.  HPI: He is a patient who has history of kidney stone status post left nephrectomy, history of recurrent urinary tract infection for the last one year and half presently patient was brought because of increased confusion, poor by mouth intake for the last 3 days. According to the patient he was at [facility] because of urine tract infection. During that time he was found to have a collapse left kidney. He had nephrectomy. However patient continued to have urinary tract infection and was put on antibiotics about 4 days ago. Patient complains of some chills and also fever. He denies any difficulty breathing.  Past Medical History  Diagnosis Date  . UTI (lower urinary tract infection)   . Chronic back pain   . Kidney stones   . Hypercalcemia     Past Surgical History  Procedure Laterality Date  . Nephrectomy      04/2014  . Back surgery      10/2013    History reviewed. No pertinent family history.  Social History:  reports that he has been smoking.  He does not have any smokeless tobacco history on file. He reports that he does not drink alcohol or use illicit drugs.  Allergies: No Known Allergies  Medications: I have reviewed the patient's current medications.  Results for orders placed or performed during the hospital encounter of 09/15/14 (from the past 48 hour(s))  CBC with Differential     Status: Abnormal   Collection Time: 09/15/14  9:21 AM  Result Value Ref Range   WBC 7.5 4.0 - 10.5 K/uL   RBC 4.50 4.22 - 5.81 MIL/uL   Hemoglobin 13.8 13.0 - 17.0 g/dL   HCT 39.1 39.0 - 52.0 %   MCV 86.9 78.0 - 100.0 fL   MCH 30.7 26.0 - 34.0 pg   MCHC 35.3 30.0 - 36.0 g/dL   RDW 14.5 11.5 - 15.5 %   Platelets 61 (L) 150 - 400 K/uL    Comment: SPECIMEN CHECKED FOR CLOTS PLATELET COUNT CONFIRMED BY SMEAR    Neutrophils Relative % 94 (H) 43 - 77 %   Neutro Abs 7.1 1.7 -  7.7 K/uL   Lymphocytes Relative 2 (L) 12 - 46 %   Lymphs Abs 0.2 (L) 0.7 - 4.0 K/uL   Monocytes Relative 4 3 - 12 %   Monocytes Absolute 0.3 0.1 - 1.0 K/uL   Eosinophils Relative 0 0 - 5 %   Eosinophils Absolute 0.0 0.0 - 0.7 K/uL   Basophils Relative 0 0 - 1 %   Basophils Absolute 0.0 0.0 - 0.1 K/uL   Smear Review PLATELETS APPEAR DECREASED   Basic metabolic panel     Status: Abnormal   Collection Time: 09/15/14  9:21 AM  Result Value Ref Range   Sodium 136 (L) 137 - 147 mEq/L   Potassium 5.0 3.7 - 5.3 mEq/L   Chloride 101 96 - 112 mEq/L   CO2 21 19 - 32 mEq/L   Glucose, Bld 112 (H) 70 - 99 mg/dL   BUN 64 (H) 6 - 23 mg/dL   Creatinine, Ser 4.63 (H) 0.50 - 1.35 mg/dL   Calcium 10.8 (H) 8.4 - 10.5 mg/dL   GFR calc non Af Amer 12 (L) >90 mL/min   GFR calc Af Amer 14 (L) >90 mL/min    Comment: (NOTE) The eGFR has been calculated using the CKD EPI equation. This  calculation has not been validated in all clinical situations. eGFR's persistently <90 mL/min signify possible Chronic Kidney Disease.    Anion gap 14 5 - 15  Troponin I     Status: Abnormal   Collection Time: 09/15/14  9:21 AM  Result Value Ref Range   Troponin I 0.81 (HH) <0.30 ng/mL    Comment: CRITICAL RESULT CALLED TO, READ BACK BY AND VERIFIED WITH: Coatney,L AT 10:10AM ON 09/15/14 BY FESTERMAN,C        Due to the release kinetics of cTnI, a negative result within the first hours of the onset of symptoms does not rule out myocardial infarction with certainty. If myocardial infarction is still suspected, repeat the test at appropriate intervals.   Lactic acid, plasma     Status: None   Collection Time: 09/15/14  9:30 AM  Result Value Ref Range   Lactic Acid, Venous 1.4 0.5 - 2.2 mmol/L  Urinalysis, Routine w reflex microscopic     Status: Abnormal   Collection Time: 09/15/14 10:00 AM  Result Value Ref Range   Color, Urine YELLOW YELLOW   APPearance CLEAR CLEAR   Specific Gravity, Urine 1.020 1.005 -  1.030   pH 5.5 5.0 - 8.0   Glucose, UA NEGATIVE NEGATIVE mg/dL   Hgb urine dipstick LARGE (A) NEGATIVE   Bilirubin Urine SMALL (A) NEGATIVE   Ketones, ur NEGATIVE NEGATIVE mg/dL   Protein, ur 100 (A) NEGATIVE mg/dL   Urobilinogen, UA 1.0 0.0 - 1.0 mg/dL   Nitrite NEGATIVE NEGATIVE   Leukocytes, UA MODERATE (A) NEGATIVE  Urine microscopic-add on     Status: Abnormal   Collection Time: 09/15/14 10:00 AM  Result Value Ref Range   WBC, UA TOO NUMEROUS TO COUNT <3 WBC/hpf   RBC / HPF TOO NUMEROUS TO COUNT <3 RBC/hpf   Bacteria, UA MANY (A) RARE  Urine rapid drug screen (hosp performed)     Status: Abnormal   Collection Time: 09/15/14 10:02 AM  Result Value Ref Range   Opiates NONE DETECTED NONE DETECTED   Cocaine NONE DETECTED NONE DETECTED   Benzodiazepines POSITIVE (A) NONE DETECTED   Amphetamines NONE DETECTED NONE DETECTED   Tetrahydrocannabinol NONE DETECTED NONE DETECTED   Barbiturates NONE DETECTED NONE DETECTED    Comment:        DRUG SCREEN FOR MEDICAL PURPOSES ONLY.  IF CONFIRMATION IS NEEDED FOR ANY PURPOSE, NOTIFY LAB WITHIN 5 DAYS.        LOWEST DETECTABLE LIMITS FOR URINE DRUG SCREEN Drug Class       Cutoff (ng/mL) Amphetamine      1000 Barbiturate      200 Benzodiazepine   150 Tricyclics       569 Opiates          300 Cocaine          300 THC              50   MRSA PCR Screening     Status: Abnormal   Collection Time: 09/15/14 12:15 PM  Result Value Ref Range   MRSA by PCR POSITIVE (A) NEGATIVE    Comment:        The GeneXpert MRSA Assay (FDA approved for NASAL specimens only), is one component of a comprehensive MRSA colonization surveillance program. It is not intended to diagnose MRSA infection nor to guide or monitor treatment for MRSA infections. RESULT CALLED TO, READ BACK BY AND VERIFIED WITH: ROWE,N. AT 1408 ON 09/15/2014 BY BAUGHAM,M.  US Renal  09/15/2014   CLINICAL DATA:  Recurrent urinary tract infections  EXAM: RENAL/URINARY  TRACT ULTRASOUND COMPLETE  COMPARISON:  None.  FINDINGS: Right Kidney:  Length: 15.6 cm. Echogenicity within normal limits. No mass or hydronephrosis visualized.  Left Kidney:  Length: Surgically absent.  Bladder:  Appears normal for degree of bladder distention.  Other:  Gallstones and gallbladder sludge noted.  IMPRESSION: 1. Normal appearance of the right kidney. 2. Status post left nephrectomy.   Electronically Signed   By: Kerby Moors M.D.   On: 09/15/2014 14:10   Dg Chest Port 1 View  09/15/2014   CLINICAL DATA:  Urinary tract infection  EXAM: PORTABLE CHEST - 1 VIEW  COMPARISON:  None.  FINDINGS: Normal cardiothymic silhouette. Mild central venous pulmonary congestion. No effusion, infiltrate, or pneumothorax.  IMPRESSION: Central venous pulmonary congestion.  No focal infiltrate.   Electronically Signed   By: Suzy Bouchard M.D.   On: 09/15/2014 09:32    Review of Systems  Constitutional: Positive for fever and chills.  Respiratory: Negative for shortness of breath.   Gastrointestinal: Positive for nausea and constipation. Negative for vomiting.  Genitourinary: Positive for urgency.  Neurological: Positive for weakness.  Psychiatric/Behavioral:       Altered mental status   Blood pressure 91/61, pulse 75, temperature 98.5 F (36.9 C), temperature source Oral, resp. rate 23, height '5\' 10"'  (1.778 m), weight 66.7 kg (147 lb 0.8 oz), SpO2 95 %. Physical Exam  Constitutional: No distress.  Eyes: No scleral icterus.  Neck: No JVD present.  Cardiovascular: Regular rhythm.   Respiratory: He has no wheezes. He has no rales.  GI: There is tenderness. There is no rebound.  Musculoskeletal: He exhibits no edema.  Neurological: He is alert.    Assessment/Plan: Problem #1 acute kidney injury most likely from dehydration associated with poor by mouth intake. However ATN and also Bactrim may play some role. Problem #2 hypercalcemia: Most likely from dehydration Problem #3 history of  recurrent or tract infection: Presently started on antibiotics. Patient has ultrasound of the kidney there is no kidney stone or sign of hydronephrosis. Problem #4 thrombocytopenia Problem #5 chronic back pain: His status post surgery in January of this year Problem #6 right nephromegaly: His right kidney is 15.6  Problem #7 history of BPH Plan: Agree with hydration and we'll increase his IV fluid to 135 mL per hour We'll check his basic metabolic panel, phosphorus, vitamin D, intact PTH in the morning.   Averey Trompeter S 09/15/2014, 3:21 PM

## 2014-09-15 NOTE — Progress Notes (Addendum)
ANTIBIOTIC CONSULT NOTE  Pharmacy Consult for Rocephin Indication: UTI  No Known Allergies  Patient Measurements: Height: 5\' 11"  (180.3 cm) Weight: 145 lb (65.772 kg) IBW/kg (Calculated) : 75.3  Vital Signs: Temp: 98.5 F (36.9 C) (11/19 1113) Temp Source: Oral (11/19 1113) BP: 91/61 mmHg (11/19 1130) Pulse Rate: 75 (11/19 1130) Intake/Output from previous day:   Intake/Output from this shift:    Labs:  Recent Labs  09/15/14 0921  WBC 7.5  HGB 13.8  PLT 61*  CREATININE 4.63*   Estimated Creatinine Clearance: 14.6 mL/min (by C-G formula based on Cr of 4.63). No results for input(s): VANCOTROUGH, VANCOPEAK, VANCORANDOM, GENTTROUGH, GENTPEAK, GENTRANDOM, TOBRATROUGH, TOBRAPEAK, TOBRARND, AMIKACINPEAK, AMIKACINTROU, AMIKACIN in the last 72 hours.   Microbiology: No results found for this or any previous visit (from the past 720 hour(s)).  Anti-infectives    Start     Dose/Rate Route Frequency Ordered Stop   09/16/14 1000  cefTRIAXone (ROCEPHIN) 1 g in dextrose 5 % 50 mL IVPB - Premix     1 g100 mL/hr over 30 Minutes Intravenous Every 24 hours 09/15/14 1200     09/15/14 1045  cefTRIAXone (ROCEPHIN) 1 g in dextrose 5 % 50 mL IVPB     1 g100 mL/hr over 30 Minutes Intravenous  Once 09/15/14 1044 09/15/14 1146      Assessment: 36 yoM admitted with urosepsis and acute renal failure.  He was started on Bactrim on 11/16 as an outpatient.   Acute renal issues could be related to dehydration or Bactrim.   He is afebrile with normal WBC today.  Scr above baseline.  Rocephin dose not require renal dose adjustment.  Rocephin 11/19>> Bactrim 11/16>>11/19  Goal of Therapy:  Eradicate infection.  Plan:  Rocephin 1gm IV q24h Monitor renal function and cx data  Duration of therapy per MD  Biagio Borg 09/15/2014,12:03 PM  Antibiotic coverage is being broadened to Zosyn.  Dose adjusted for CrCl<85ml/min.  F/U renal fxn, patient progress, cx data. 1. D/C  Rocephin 2. Zosyn 2.25gm IV q8h  Netta Cedars, PharmD, BCPS 09/15/2014 @1 :44 PM

## 2014-09-15 NOTE — ED Notes (Signed)
Pt's wife reports pt tested positive for UTI on home test strip Monday and started him on an antibiotic.  Reports pt has since refused to come to the doctor, has been confused and lethargic today.  PT alert, denies pain, very weak.

## 2014-09-15 NOTE — Progress Notes (Signed)
Sent page to Dr. Sharren Bridge to let him know that troponin increased again from 1.73 to 2.06 and that patient had been bradycardic but asymptomatic. Also let him know that cardiology was going to see patient in the am. Did not receive any return call or new orders. Will continue to monitor.

## 2014-09-15 NOTE — ED Provider Notes (Signed)
CSN: [id number]     Arrival date & time 09/15/14  0900 History   First MD Initiated Contact with Patient 09/15/14 0902     Chief Complaint  Patient presents with  . Altered Mental Status  . Urinary Tract Infection      Patient is a 68 y.o. male presenting with altered mental status and urinary tract infection. The history is provided by a caregiver, a relative and the patient. The history is limited by the condition of the patient (confusion).  Altered Mental Status Urinary Tract Infection  Pt was seen at 0910. Per pt and his family: c/o pt with gradual onset and worsening confusion for the past 3 to 4 days. Pt was evaluated by his PMD 1 week ago but "refused to get any tests done." Pt's family states he is "just getting worse." Family tested his urine with a home urinalysis kit which resulted +UTI. Family then called pt's PMD on Monday (3 days ago) and was rx an antibiotic. Pt's family states pt "won't eat or drink anything" and has "been weak and tired" for the past several days. Unknown fevers. No falls, no SOB/cough, no N/V/D, no abd pain.    Past Medical History  Diagnosis Date  . UTI (lower urinary tract infection)    Past Surgical History  Procedure Laterality Date  . Nephrectomy    . Back surgery      History  Substance Use Topics  . Smoking status: Current Every Day Smoker  . Smokeless tobacco: Not on file  . Alcohol Use: No    Review of Systems  Unable to perform ROS: Mental status change     Allergies  Review of patient's allergies indicates no known allergies.  Home Medications   Prior to Admission medications   Medication Sig Start Date End Date Taking? Authorizing Provider  B Complex-C (B-COMPLEX WITH VITAMIN C) tablet Take 1 tablet by mouth daily.   Yes Historical Provider, MD  Multiple Vitamin (MULTIVITAMIN WITH MINERALS) TABS tablet Take 1 tablet by mouth daily.   Yes Historical Provider, MD  Potassium 95 MG TABS Take 1 tablet by mouth daily.   Yes  Historical Provider, MD  ALPRAZolam Duanne Moron) 1 MG tablet Take 1 mg by mouth at bedtime as needed. 08/15/14   Historical Provider, MD  ondansetron (ZOFRAN-ODT) 4 MG disintegrating tablet Take 1 tablet by mouth every 8 (eight) hours as needed. 08/15/14   Historical Provider, MD  Oxycodone HCl 10 MG TABS Take 1 tablet by mouth 2 (two) times daily as needed. 08/15/14   Historical Provider, MD  sulfamethoxazole-trimethoprim (BACTRIM DS,SEPTRA DS) 800-160 MG per tablet Take 1 tablet by mouth 2 (two) times daily. Starting 09/12/2014 x 10 days. 09/12/14   Historical Provider, MD  tamsulosin (FLOMAX) 0.4 MG CAPS capsule Take 0.4 mg by mouth daily. 08/28/14   Historical Provider, MD   BP 101/61 mmHg  Pulse 85  Temp(Src) 100.6 F (38.1 C) (Rectal)  Resp 16  Ht _0  (1.803 m)  Wt 145 lb (65.772 kg)  BMI 20.23 kg/m2  SpO2 96%   Filed Vitals:   09/15/14 0907 09/15/14 0919 09/15/14 0930 09/15/14 1000  BP: 101/61   92/59  Pulse: 85  87 83  Temp: 98.8 F (37.1 C) 100.6 F (38.1 C)    TempSrc: Oral Rectal    Resp: _1 Height: _2  (1.803 m)     Weight: 145 lb (65.772 kg)     SpO2: 96%  93% 94%  Physical Exam  0915: Physical examination:  Nursing notes reviewed; Vital signs and O2 SAT reviewed;  Constitutional: Disheveled, In no acute distress; Head:  Normocephalic, atraumatic; Eyes: EOMI, PERRL, No scleral icterus; ENMT: Mouth and pharynx normal, Mucous membranes dry and cracked; Neck: Supple, Full range of motion, No lymphadenopathy; Cardiovascular: Regular rate and rhythm, No gallop; Respiratory: Breath sounds clear & equal bilaterally, No wheezes. Normal respiratory effort/excursion.; Chest: Nontender, Movement normal; Abdomen: Soft, Nontender, Nondistended, Normal bowel sounds; Genitourinary: No CVA tenderness; Extremities: Pulses normal, No tenderness, No edema, No calf edema or asymmetry.; Neuro: Awake, alert, confused re: events. Major CN grossly intact. No facial droop. Speaks few  words, speech clear. Moves all extremities spontaneously on stretcher without apparent gross focal motor deficits.; Skin: Color normal, Warm, Dry.   ED Course  Procedures     EKG Interpretation   Date/Time:  Thursday September 15 2014 09:22:22 EST Ventricular Rate:  85 PR Interval:  117 QRS Duration: 92 QT Interval:  336 QTC Calculation: 399 R Axis:   67 Text Interpretation:  Sinus rhythm Borderline short PR interval Probable  left atrial enlargement Nonspecific ST and T wave abnormality Diffuse No  old tracing to compare Confirmed by [facility]  MD, Nunzio Cory [phone]) on  09/15/2014 9:36:51 AM      MDM  MDM Reviewed: previous chart, nursing note and vitals Reviewed previous: labs and ECG Interpretation: labs, ECG and x-ray Total time providing critical care: 30-74 minutes. This excludes time spent performing separately reportable procedures and services. Consults: admitting MD and cardiology     CRITICAL CARE Performed by: Alfonzo Feller Total critical care time: 35 Critical care time was exclusive of separately billable procedures and treating other patients. Critical care was necessary to treat or prevent imminent or life-threatening deterioration. Critical care was time spent personally by me on the following activities: development of treatment plan with patient and/or surrogate as well as nursing, discussions with consultants, evaluation of patient's response to treatment, examination of patient, obtaining history from patient or surrogate, ordering and performing treatments and interventions, ordering and review of laboratory studies, ordering and review of radiographic studies, pulse oximetry and re-evaluation of patient's condition.   Results for orders placed or performed during the hospital encounter of 09/15/14  Urinalysis, Routine w reflex microscopic  Result Value Ref Range   Color, Urine YELLOW YELLOW   APPearance CLEAR CLEAR   Specific Gravity, Urine  1.020 1.005 - 1.030   pH 5.5 5.0 - 8.0   Glucose, UA NEGATIVE NEGATIVE mg/dL   Hgb urine dipstick LARGE (A) NEGATIVE   Bilirubin Urine SMALL (A) NEGATIVE   Ketones, ur NEGATIVE NEGATIVE mg/dL   Protein, ur 100 (A) NEGATIVE mg/dL   Urobilinogen, UA 1.0 0.0 - 1.0 mg/dL   Nitrite NEGATIVE NEGATIVE   Leukocytes, UA MODERATE (A) NEGATIVE  CBC with Differential  Result Value Ref Range   WBC 7.5 4.0 - 10.5 K/uL   RBC 4.50 4.22 - 5.81 MIL/uL   Hemoglobin 13.8 13.0 - 17.0 g/dL   HCT 39.1 39.0 - 52.0 %   MCV 86.9 78.0 - 100.0 fL   MCH 30.7 26.0 - 34.0 pg   MCHC 35.3 30.0 - 36.0 g/dL   RDW 14.5 11.5 - 15.5 %   Platelets 61 (L) 150 - 400 K/uL   Neutrophils Relative % 94 (H) 43 - 77 %   Neutro Abs 7.1 1.7 - 7.7 K/uL   Lymphocytes Relative 2 (L) 12 - 46 %   Lymphs Abs 0.2 (L)  0.7 - 4.0 K/uL   Monocytes Relative 4 3 - 12 %   Monocytes Absolute 0.3 0.1 - 1.0 K/uL   Eosinophils Relative 0 0 - 5 %   Eosinophils Absolute 0.0 0.0 - 0.7 K/uL   Basophils Relative 0 0 - 1 %   Basophils Absolute 0.0 0.0 - 0.1 K/uL   Smear Review PLATELETS APPEAR DECREASED   Basic metabolic panel  Result Value Ref Range   Sodium 136 (L) 137 - 147 mEq/L   Potassium 5.0 3.7 - 5.3 mEq/L   Chloride 101 96 - 112 mEq/L   CO2 21 19 - 32 mEq/L   Glucose, Bld 112 (H) 70 - 99 mg/dL   BUN 64 (H) 6 - 23 mg/dL   Creatinine, Ser 4.63 (H) 0.50 - 1.35 mg/dL   Calcium 10.8 (H) 8.4 - 10.5 mg/dL   GFR calc non Af Amer 12 (L) >90 mL/min   GFR calc Af Amer 14 (L) >90 mL/min   Anion gap 14 5 - 15  Lactic acid, plasma  Result Value Ref Range   Lactic Acid, Venous 1.4 0.5 - 2.2 mmol/L  Troponin I  Result Value Ref Range   Troponin I 0.81 (HH) <0.30 ng/mL  Urine microscopic-add on  Result Value Ref Range   WBC, UA TOO NUMEROUS TO COUNT <3 WBC/hpf   RBC / HPF TOO NUMEROUS TO COUNT <3 RBC/hpf   Bacteria, UA MANY (A) RARE   Dg Chest Port 1 View 09/15/2014   CLINICAL DATA:  Urinary tract infection  EXAM: PORTABLE CHEST - 1 VIEW   COMPARISON:  None.  FINDINGS: Normal cardiothymic silhouette. Mild central venous pulmonary congestion. No effusion, infiltrate, or pneumothorax.  IMPRESSION: Central venous pulmonary congestion.  No focal infiltrate.   Electronically Signed   By: Suzy Bouchard M.D.   On: 09/15/2014 09:32    1035:  Troponin elevated. Pt denies CP/SOB. EKG without acute STEMI. BUN/Cr elevated; no old to compare. SPB 90-100's; will dose judicious IVF. APAP given for fever, will obtain BCx2 and dose IV rocephin for +UTI (UC pending.) Dx and testing d/w pt and family.  Questions answered.  Verb understanding, agreeable to admit.  T/C to [facility] Dr. Raliegh Ip, case discussed, including:  HPI, pertinent PM/SHx, VS/PE, dx testing, ED course and treatment:  Agreeable to consult. T/C to [facility] Dr. Roderic Palau, case discussed, including:  HPI, pertinent PM/SHx, VS/PE, dx testing, ED course and treatment:  Agreeable to admit, requests to write temporary orders, obtain stepdown bed to team APAdmits.   Francine Graven, DO 09/17/14 [phone]

## 2014-09-15 NOTE — Plan of Care (Signed)
Problem: Phase I Progression Outcomes Goal: Initial discharge plan identified Outcome: Completed/Met Date Met:  09/15/14

## 2014-09-15 NOTE — ED Notes (Signed)
NP at bedside.

## 2014-09-16 ENCOUNTER — Encounter (HOSPITAL_COMMUNITY): Payer: Self-pay | Admitting: Adult Health

## 2014-09-16 DIAGNOSIS — E871 Hypo-osmolality and hyponatremia: Secondary | ICD-10-CM

## 2014-09-16 DIAGNOSIS — I248 Other forms of acute ischemic heart disease: Secondary | ICD-10-CM

## 2014-09-16 DIAGNOSIS — I369 Nonrheumatic tricuspid valve disorder, unspecified: Secondary | ICD-10-CM

## 2014-09-16 DIAGNOSIS — Z716 Tobacco abuse counseling: Secondary | ICD-10-CM

## 2014-09-16 LAB — URINALYSIS, ROUTINE W REFLEX MICROSCOPIC
Bilirubin Urine: NEGATIVE
Glucose, UA: NEGATIVE mg/dL
KETONES UR: NEGATIVE mg/dL
Nitrite: NEGATIVE
PROTEIN: 30 mg/dL — AB
Specific Gravity, Urine: 1.01 (ref 1.005–1.030)
Urobilinogen, UA: 2 mg/dL — ABNORMAL HIGH (ref 0.0–1.0)
pH: 6 (ref 5.0–8.0)

## 2014-09-16 LAB — BASIC METABOLIC PANEL
ANION GAP: 11 (ref 5–15)
BUN: 60 mg/dL — ABNORMAL HIGH (ref 6–23)
CHLORIDE: 104 meq/L (ref 96–112)
CO2: 21 meq/L (ref 19–32)
Calcium: 10.3 mg/dL (ref 8.4–10.5)
Creatinine, Ser: 4.03 mg/dL — ABNORMAL HIGH (ref 0.50–1.35)
GFR calc Af Amer: 16 mL/min — ABNORMAL LOW (ref >90)
GFR calc non Af Amer: 14 mL/min — ABNORMAL LOW (ref >90)
Glucose, Bld: 80 mg/dL (ref 70–99)
POTASSIUM: 5.2 meq/L (ref 3.7–5.3)
SODIUM: 136 meq/L — AB (ref 137–147)

## 2014-09-16 LAB — CBC
HCT: 36.6 % — ABNORMAL LOW (ref 39.0–52.0)
HEMOGLOBIN: 12.6 g/dL — AB (ref 13.0–17.0)
MCH: 30.4 pg (ref 26.0–34.0)
MCHC: 34.4 g/dL (ref 30.0–36.0)
MCV: 88.4 fL (ref 78.0–100.0)
Platelets: 64 10*3/uL — ABNORMAL LOW (ref 150–400)
RBC: 4.14 MIL/uL — ABNORMAL LOW (ref 4.22–5.81)
RDW: 15 % (ref 11.5–15.5)
WBC: 9.6 10*3/uL (ref 4.0–10.5)

## 2014-09-16 LAB — URINE MICROSCOPIC-ADD ON

## 2014-09-16 LAB — PHOSPHORUS: PHOSPHORUS: 3.1 mg/dL (ref 2.3–4.6)

## 2014-09-16 LAB — TROPONIN I: TROPONIN I: 1.71 ng/mL — AB (ref <0.30)

## 2014-09-16 MED ORDER — ALPRAZOLAM 1 MG PO TABS
1.0000 mg | ORAL_TABLET | Freq: Every evening | ORAL | Status: DC | PRN
  Administered 2014-09-16 – 2014-09-19 (×2): 1 mg via ORAL
  Filled 2014-09-16: qty 2
  Filled 2014-09-16: qty 1

## 2014-09-16 MED ORDER — HALOPERIDOL LACTATE 5 MG/ML IJ SOLN
5.0000 mg | Freq: Once | INTRAMUSCULAR | Status: AC
  Administered 2014-09-16: 5 mg via INTRAVENOUS
  Filled 2014-09-16: qty 1

## 2014-09-16 MED ORDER — FUROSEMIDE 10 MG/ML IJ SOLN
40.0000 mg | Freq: Two times a day (BID) | INTRAMUSCULAR | Status: DC
  Administered 2014-09-16 – 2014-09-17 (×3): 40 mg via INTRAVENOUS
  Filled 2014-09-16 (×3): qty 4

## 2014-09-16 MED ORDER — ENSURE COMPLETE PO LIQD
237.0000 mL | Freq: Two times a day (BID) | ORAL | Status: DC
  Administered 2014-09-16 – 2014-09-20 (×8): 237 mL via ORAL

## 2014-09-16 MED ORDER — BENZONATATE 100 MG PO CAPS
200.0000 mg | ORAL_CAPSULE | Freq: Three times a day (TID) | ORAL | Status: DC | PRN
  Administered 2014-09-16: 200 mg via ORAL
  Filled 2014-09-16: qty 2

## 2014-09-16 NOTE — Progress Notes (Signed)
Subjective: Interval History: has no complaint of nausea or vomiting. His appetite is getting better. Patient denies any difficulty in breathing..  Objective: Vital signs in last 24 hours: Temp:  [96.6 F (35.9 C)-100.6 F (38.1 C)] 98.8 F (37.1 C) (11/20 0400) Pulse Rate:  [44-97] 79 (11/20 0500) Resp:  [12-27] 23 (11/20 0500) BP: (52-108)/(33-70) 104/57 mmHg (11/20 0500) SpO2:  [91 %-100 %] 91 % (11/20 0500) Weight:  [65.772 kg (145 lb)-70.8 kg (156 lb 1.4 oz)] 70.8 kg (156 lb 1.4 oz) (11/20 0500) Weight change:   Intake/Output from previous day: 11/19 0701 - 11/20 0700 In: 2229.6 [I.V.:2079.6; IV Piggyback:150] Out: 850 [Urine:850] Intake/Output this shift:    General appearance: alert, cooperative and no distress Resp: clear to auscultation bilaterally Cardio: regular rate and rhythm, S1, S2 normal, no murmur, click, rub or gallop GI: soft, non-tender; bowel sounds normal; no masses,  no organomegaly Extremities: extremities normal, atraumatic, no cyanosis or edema  Lab Results:  Recent Labs  09/15/14 0921 09/16/14 0347  WBC 7.5 9.6  HGB 13.8 12.6*  HCT 39.1 36.6*  PLT 61* 64*   BMET:  Recent Labs  09/15/14 0921 09/16/14 0347  NA 136* 136*  K 5.0 5.2  CL 101 104  CO2 21 21  GLUCOSE 112* 80  BUN 64* 60*  CREATININE 4.63* 4.03*  CALCIUM 10.8* 10.3   No results for input(s): PTH in the last 72 hours. Iron Studies: No results for input(s): IRON, TIBC, TRANSFERRIN, FERRITIN in the last 72 hours.  Studies/Results: US Renal  09/15/2014   CLINICAL DATA:  Recurrent urinary tract infections  EXAM: RENAL/URINARY TRACT ULTRASOUND COMPLETE  COMPARISON:  None.  FINDINGS: Right Kidney:  Length: 15.6 cm. Echogenicity within normal limits. No mass or hydronephrosis visualized.  Left Kidney:  Length: Surgically absent.  Bladder:  Appears normal for degree of bladder distention.  Other:  Gallstones and gallbladder sludge noted.  IMPRESSION: 1. Normal appearance of the  right kidney. 2. Status post left nephrectomy.   Electronically Signed   By: Kerby Moors M.D.   On: 09/15/2014 14:10   Dg Chest Port 1 View  09/15/2014   CLINICAL DATA:  Urinary tract infection  EXAM: PORTABLE CHEST - 1 VIEW  COMPARISON:  None.  FINDINGS: Normal cardiothymic silhouette. Mild central venous pulmonary congestion. No effusion, infiltrate, or pneumothorax.  IMPRESSION: Central venous pulmonary congestion.  No focal infiltrate.   Electronically Signed   By: Suzy Bouchard M.D.   On: 09/15/2014 09:32    I have reviewed the patient's current medications.  Assessment/Plan: Problem #1 acute kidney injury: His BUN and creatinine is improving. Patient is non-oliguric and presently he doesn't have any sign and symptoms of uremia. Problem #2 hypercalcemia: Most likely from dehydration. His calcium is improving. Presently workup is pending. 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 We'll check his basic metabolic panel in the morning    LOS: 1 day   Cejay Cambre S 09/16/2014,7:42 AM

## 2014-09-16 NOTE — Care Management Note (Addendum)
    Page 1 of 2   09/20/2014     11:12:42 AM CARE MANAGEMENT NOTE 09/20/2014  Patient:  Ivan [name],Ivan [name]   Account Number:  [id number]  Date Initiated:  09/16/2014  Documentation initiated by:  Jolene Provost  Subjective/Objective Assessment:   Pt lives at home with wife. Pt has hospital bed, walker, cane and BSC at home. Pt has no HH services or medication needs prior to admission.     Action/Plan:   Pt plans to discharge home with [facility] PT. Pt may discharge home over weekend, [name] (of [facility] per pt's choice) was notified of pending orders. Pt's RN instructed on how to contact [place] if pt is discharged over weekend.   Anticipated DC Date:  09/18/2014   Anticipated DC Plan:  [place]  CM consult      [facility] Choice  HOME HEALTH   Choice offered to / List presented to:  [name]        [name] arranged  HH-2 PT      [name]   Status of service:  Completed, signed off Medicare Important Message given?  YES (If response is "NO", the following Medicare IM given date fields will be blank) Date Medicare IM given:  09/16/2014 Medicare IM given by:  [name] Date Additional Medicare IM given:  09/20/2014 Additional Medicare IM given by:  [name]  Discharge Disposition:  [place]  Per UR Regulation:    If discussed at Long Length of Stay Meetings, dates discussed:   09/20/2014    Comments:  09/20/3014 0900 Jolene Provost, RN, MSN, PCCN Pt plans to discharge home with self care today. HH PT via [facility] will visit. [facility] is aware of discharge, orders and face sheet, per [facility] coordinator PT will call pt and schedule time for visit. Pt, pt's wife and RN made aware of discharge arrangments. No further CM needs identified at thist ime.  09/19/2014 [place], RN, MSN, PCCN Pt plans to discharge home with [place] in the next couple days. [place],  pt's choice, notified pt is still in [place] and anticipates D/C in [place] days. Will call back and fax orders and face to face when pt is dicharged. CM will continue to follow.  09/16/2014 [place], RN, MSN, PCCN Pt resident of [place] and unable to use [facility]. Have arranged for [facility], [facility], [facility] through [facility] services (phone number [phone]), spoke with [facility]. Will instruct weekend RN to fax orders and face to face to [facility] if pt discharged.  09/16/2014 [place], RN, MSN, PCCN No further CM needs identified at this time.

## 2014-09-16 NOTE — Progress Notes (Signed)
INITIAL NUTRITION ASSESSMENT  DOCUMENTATION CODES Per approved criteria  -Not Applicable   INTERVENTION: Ensure Complete po BID, each supplement provides 350 kcal and 13 grams of protein   NUTRITION DIAGNOSIS: Inadequate oral intake related to anorexia, acute renal failure as evidenced by 0-25% meal intake .   Goal: Pt to meet >/= 90% of their estimated nutrition needs    Monitor: Po intake, labs and wt trends     Reason for Assessment: Malnutrition Screen   68 y.o. male  Admitting Dx: Acute renal failure  ASSESSMENT: Pt has hx of frequent infections recently hospitalized with UTI and sepsis. S/p nephrectomy. Presents with anorexia but is unable to tell me what exactly he has been eating recently. Pt says his wife will be here later and to ask her. She shops for food and prepares their meals. He is able to feed himself. Poor dentition noted. He reports his recent weight as 142# and says that he weighed 190# back in 1999. Unable to verify his actual weight change at this time due to limited available hx. Observed mild temporal, clavicle and acromion muscle wasting and fat mass depletion to upper arms.     Height: Ht Read[place] from Last 1 Encounters:  09/15/14 5\' 10"  (1.778 m)    Weight: Wt Readings from Last 1 Encounters:  09/16/14 156 lb 1.4 oz (70.8 kg)    Ideal Body Weight: 166#  % Ideal Body Weight: 94%  Wt Readings from Last 10 Encounters:  09/16/14 156 lb 1.4 oz (70.8 kg)    Usual Body Weight: ????  BMI:  Body mass index is 22.4 kg/(m^2). normal range  Estimated Nutritional Needs: Kcal: 1900-2300 Protein: 70-80 gr Fluid: 1.9-2.3 liters daily  Skin: intact  Diet Order: Diet regular  EDUCATION NEEDS: -No education needs identified at this time   Intake/Output Summary (Last 24 hours) at 09/16/14 1033 Last data filed at 09/16/14 1020  Gross per 24 hour  Intake 2739.58 ml  Output    950 ml  Net 1789.58 ml    Last BM:  11/20  Labs:   Recent  Labs Lab 09/15/14 0921 09/16/14 0347  NA 136* 136*  K 5.0 5.2  CL 101 104  CO2 21 21  BUN 64* 60*  CREATININE 4.63* 4.03*  CALCIUM 10.8* 10.3  PHOS  --  3.1  GLUCOSE 112* 80    CBG (last 3)  No results for input(s): GLUCAP in the last 72 hours.  Scheduled Meds: . antiseptic oral rinse  7 mL Mouth Rinse BID  . Chlorhexidine Gluconate Cloth  6 each Topical Q0600  . docusate sodium  100 mg Oral BID  . furosemide  40 mg Intravenous BID  . mupirocin ointment  1 application Nasal BID  . piperacillin-tazobactam (ZOSYN)  IV  2.25 g Intravenous 3 times per day  . sodium chloride  3 mL Intravenous Q12H  . tamsulosin  0.4 mg Oral Daily    Continuous Infusions: . sodium chloride 135 mL/hr at 09/16/14 0800    Past Medical History  Diagnosis Date  . UTI (lower urinary tract infection)   . Chronic back pain   . Kidney stones   . Hypercalcemia     Past Surgical History  Procedure Laterality Date  . Nephrectomy Left     04/2014  . Back surgery      10/2013   Colman Cater MS,RD,CSG,LDN Office: #[phone] Pager: [phone]

## 2014-09-16 NOTE — Progress Notes (Signed)
  Echocardiogram 2D Echocardiogram has been performed.  [place], [place] 09/16/2014, 2:49 PM

## 2014-09-16 NOTE — Progress Notes (Signed)
Pt is becoming more and more confused. Trying to climb out of bed-forgetting he has a condom cath in place. Refusing to eat solid food. Will take po fluids at times .wife at bedside. She states this is not like pt's normal behavior.

## 2014-09-16 NOTE — Progress Notes (Signed)
Patient remains confused, hallucinating, trying to drink out of his safety mitt, which we can not keep on him, he is very strong, has remained 1:1 throughout the evening. Pulling off condom catheter, pulling off electrodes and trying to get oob, PT eval today said he needs a walker to get oob. Wife insists he does not drink, used to but since illness he hasn't.  Last drink was Friday before last per spouse.  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 Patient reported seeing palm trees in the room. Said he was packing a joint with the TV remote. Notified Dr. Jimmy Footman at [place].  Patient had not slept for greater than 24 hrs.  Haldol 5mg  once IV ordered.

## 2014-09-16 NOTE — Care Management Utilization Note (Signed)
UR review complete.  

## 2014-09-16 NOTE — Progress Notes (Signed)
Subjective: He was admitted yesterday with dehydration and acute renal failure. 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 He has blood cultures that are positive for gram-negative rods and he had troponin that was elevated to greater than 2. He says he feels better.  Objective: Vital signs in last 24 hours: Temp:  [96.6 F (35.9 C)-100.6 F (38.1 C)] 98.8 F (37.1 C) (11/20 0400) Pulse Rate:  [44-97] 80 (11/20 0800) Resp:  [12-27] 26 (11/20 0800) BP: (52-112)/(33-70) 106/59 mmHg (11/20 0800) SpO2:  [91 %-100 %] 93 % (11/20 0800) Weight:  [65.772 kg (145 lb)-70.8 kg (156 lb 1.4 oz)] 70.8 kg (156 lb 1.4 oz) (11/20 0500) Weight change:  Last BM Date: 09/14/14  Intake/Output from previous day: 11/19 0701 - 11/20 0700 In: 2229.6 [I.V.:2079.6; IV Piggyback:150] Out: 850 [Urine:850]  PHYSICAL EXAM General appearance: alert, cooperative and mild distress Resp: clear to auscultation bilaterally Cardio: regular rate and rhythm, S1, S2 normal, no murmur, click, rub or gallop GI: soft, non-tender; bowel sounds normal; no masses,  no organomegaly Extremities: extremities normal, atraumatic, no cyanosis or edema  Lab Results:  Results for orders placed or performed during the hospital encounter of 09/15/14 (from the past 48 hour(s))  TSH     Status: None   Collection Time: 09/15/14  9:09 AM  Result Value Ref Range   TSH 1.030 0.350 - 4.500 uIU/mL    Comment: Performed at [facility]  CBC with Differential     Status: Abnormal   Collection Time: 09/15/14  9:21 AM  Result Value Ref Range   WBC 7.5 4.0 - 10.5 K/uL   RBC 4.50 4.22 - 5.81 MIL/uL   Hemoglobin 13.8 13.0 - 17.0 g/dL   HCT 39.1 39.0 - 52.0 %   MCV 86.9 78.0 - 100.0 fL   MCH 30.7 26.0 - 34.0 pg   MCHC 35.3 30.0 - 36.0 g/dL   RDW 14.5 11.5 - 15.5 %   Platelets 61 (L) 150 - 400 K/uL     Comment: SPECIMEN CHECKED FOR CLOTS PLATELET COUNT CONFIRMED BY SMEAR    Neutrophils Relative % 94 (H) 43 - 77 %   Neutro Abs 7.1 1.7 - 7.7 K/uL   Lymphocytes Relative 2 (L) 12 - 46 %   Lymphs Abs 0.2 (L) 0.7 - 4.0 K/uL   Monocytes Relative 4 3 - 12 %   Monocytes Absolute 0.3 0.1 - 1.0 K/uL   Eosinophils Relative 0 0 - 5 %   Eosinophils Absolute 0.0 0.0 - 0.7 K/uL   Basophils Relative 0 0 - 1 %   Basophils Absolute 0.0 0.0 - 0.1 K/uL   Smear Review PLATELETS APPEAR DECREASED   Basic metabolic panel     Status: Abnormal   Collection Time: 09/15/14  9:21 AM  Result Value Ref Range   Sodium 136 (L) 137 - 147 mEq/L   Potassium 5.0 3.7 - 5.3 mEq/L   Chloride 101 96 - 112 mEq/L   CO2 21 19 - 32 mEq/L   Glucose, Bld 112 (H) 70 - 99 mg/dL   BUN 64 (H) 6 - 23 mg/dL   Creatinine, Ser 4.63 (H) 0.50 - 1.35 mg/dL   Calcium 10.8 (H) 8.4 - 10.5 mg/dL   GFR calc non Af Amer 12 (L) >90 mL/min   GFR calc Af Amer 14 (L) >90 mL/min  Comment: (NOTE) The eGFR has been calculated using the CKD EPI equation. This calculation has not been validated in all clinical situations. eGFR's persistently <90 mL/min signify possible Chronic Kidney Disease.    Anion gap 14 5 - 15  Troponin I     Status: Abnormal   Collection Time: 09/15/14  9:21 AM  Result Value Ref Range   Troponin I 0.81 (HH) <0.30 ng/mL    Comment: CRITICAL RESULT CALLED TO, READ BACK BY AND VERIFIED WITH: Loa,L AT 10:10AM ON 09/15/14 BY FESTERMAN,C        Due to the release kinetics of cTnI, a negative result within the first hours of the onset of symptoms does not rule out myocardial infarction with certainty. If myocardial infarction is still suspected, repeat the test at appropriate intervals.   Lactic acid, plasma     Status: None   Collection Time: 09/15/14  9:30 AM  Result Value Ref Range   Lactic Acid, Venous 1.4 0.5 - 2.2 mmol/L  Blood culture (routine x 2)     Status: None (Preliminary result)   Collection  Time: 09/15/14  9:50 AM  Result Value Ref Range   Specimen Description BLOOD LEFT ANTECUBITAL    Special Requests BOTTLES DRAWN AEROBIC AND ANAEROBIC 5CC    Culture      GRAM NEGATIVE RODS Gram Stain Report Called to,Read Back By and Verified With: KEITH A AT [place] [address] BY FORSYTH K    Report Status PENDING   Urinalysis, Routine w reflex microscopic     Status: Abnormal   Collection Time: 09/15/14 10:00 AM  Result Value Ref Range   Color, Urine YELLOW YELLOW   APPearance CLEAR CLEAR   Specific Gravity, Urine 1.020 1.005 - 1.030   pH 5.5 5.0 - 8.0   Glucose, UA NEGATIVE NEGATIVE mg/dL   Hgb urine dipstick LARGE (A) NEGATIVE   Bilirubin Urine SMALL (A) NEGATIVE   Ketones, ur NEGATIVE NEGATIVE mg/dL   Protein, ur 100 (A) NEGATIVE mg/dL   Urobilinogen, UA 1.0 0.0 - 1.0 mg/dL   Nitrite NEGATIVE NEGATIVE   Leukocytes, UA MODERATE (A) NEGATIVE  Urine microscopic-add on     Status: Abnormal   Collection Time: 09/15/14 10:00 AM  Result Value Ref Range   WBC, UA TOO NUMEROUS TO COUNT <3 WBC/hpf   RBC / HPF TOO NUMEROUS TO COUNT <3 RBC/hpf   Bacteria, UA MANY (A) RARE  Urine rapid drug screen (hosp performed)     Status: Abnormal   Collection Time: 09/15/14 10:02 AM  Result Value Ref Range   Opiates NONE DETECTED NONE DETECTED   Cocaine NONE DETECTED NONE DETECTED   Benzodiazepines POSITIVE (A) NONE DETECTED   Amphetamines NONE DETECTED NONE DETECTED   Tetrahydrocannabinol NONE DETECTED NONE DETECTED   Barbiturates NONE DETECTED NONE DETECTED    Comment:        DRUG SCREEN FOR MEDICAL PURPOSES ONLY.  IF CONFIRMATION IS NEEDED FOR ANY PURPOSE, NOTIFY LAB WITHIN 5 DAYS.        LOWEST DETECTABLE LIMITS FOR URINE DRUG SCREEN Drug Class       Cutoff (ng/mL) Amphetamine      1000 Barbiturate      200 Benzodiazepine   741 Tricyclics       287 Opiates          300 Cocaine          300 THC              50  Blood culture (routine x 2)     Status: None (Preliminary result)    Collection Time: 09/15/14 10:57 AM  Result Value Ref Range   Specimen Description BLOOD LEFT ANTECUBITAL    Special Requests BOTTLES DRAWN AEROBIC AND ANAEROBIC 6CC    Culture      GRAM NEGATIVE RODS Gram Stain Report Called to,Read Back By and Verified With: KEITH A AT [place] ON [address] BY FORSYTH K    Report Status PENDING   MRSA PCR Screening     Status: Abnormal   Collection Time: 09/15/14 12:15 PM  Result Value Ref Range   MRSA by PCR POSITIVE (A) NEGATIVE    Comment:        The GeneXpert MRSA Assay (FDA approved for NASAL specimens only), is one component of a comprehensive MRSA colonization surveillance program. It is not intended to diagnose MRSA infection nor to guide or monitor treatment for MRSA infections. RESULT CALLED TO, READ BACK BY AND VERIFIED WITH: ROWE,N. AT 1408 ON 09/15/2014 BY BAUGHAM,M.   Troponin I     Status: Abnormal   Collection Time: 09/15/14  3:32 PM  Result Value Ref Range   Troponin I 1.73 (HH) <0.30 ng/mL    Comment:        Due to the release kinetics of cTnI, a negative result within the first hours of the onset of symptoms does not rule out myocardial infarction with certainty. If myocardial infarction is still suspected, repeat the test at appropriate intervals. CRITICAL VALUE NOTED.  VALUE IS CONSISTENT WITH PREVIOUSLY REPORTED AND CALLED VALUE.   Troponin I     Status: Abnormal   Collection Time: 09/15/14  9:36 PM  Result Value Ref Range   Troponin I 2.06 (HH) <0.30 ng/mL    Comment:        Due to the release kinetics of cTnI, a negative result within the first hours of the onset of symptoms does not rule out myocardial infarction with certainty. If myocardial infarction is still suspected, repeat the test at appropriate intervals. CRITICAL VALUE NOTED.  VALUE IS CONSISTENT WITH PREVIOUSLY REPORTED AND CALLED VALUE.   Basic metabolic panel     Status: Abnormal   Collection Time: 09/16/14  3:47 AM  Result Value Ref Range    Sodium 136 (L) 137 - 147 mEq/L   Potassium 5.2 3.7 - 5.3 mEq/L   Chloride 104 96 - 112 mEq/L   CO2 21 19 - 32 mEq/L   Glucose, Bld 80 70 - 99 mg/dL   BUN 60 (H) 6 - 23 mg/dL   Creatinine, Ser 4.03 (H) 0.50 - 1.35 mg/dL   Calcium 10.3 8.4 - 10.5 mg/dL   GFR calc non Af Amer 14 (L) >90 mL/min   GFR calc Af Amer 16 (L) >90 mL/min    Comment: (NOTE) The eGFR has been calculated using the CKD EPI equation. This calculation has not been validated in all clinical situations. eGFR's persistently <90 mL/min signify possible Chronic Kidney Disease.    Anion gap 11 5 - 15  CBC     Status: Abnormal   Collection Time: 09/16/14  3:47 AM  Result Value Ref Range   WBC 9.6 4.0 - 10.5 K/uL   RBC 4.14 (L) 4.22 - 5.81 MIL/uL   Hemoglobin 12.6 (L) 13.0 - 17.0 g/dL   HCT 36.6 (L) 39.0 - 52.0 %   MCV 88.4 78.0 - 100.0 fL   MCH 30.4 26.0 - 34.0 pg   MCHC 34.4 30.0 -  36.0 g/dL   RDW 15.0 11.5 - 15.5 %   Platelets 64 (L) 150 - 400 K/uL    Comment: SPECIMEN CHECKED FOR CLOTS CONSISTENT WITH PREVIOUS RESULT   Phosphorus     Status: None   Collection Time: 09/16/14  3:47 AM  Result Value Ref Range   Phosphorus 3.1 2.3 - 4.6 mg/dL  Troponin I     Status: Abnormal   Collection Time: 09/16/14  3:47 AM  Result Value Ref Range   Troponin I 1.71 (HH) <0.30 ng/mL    Comment: CRITICAL VALUE NOTED.  VALUE IS CONSISTENT WITH PREVIOUSLY REPORTED AND CALLED VALUE.        Due to the release kinetics of cTnI, a negative result within the first hours of the onset of symptoms does not rule out myocardial infarction with certainty. If myocardial infarction is still suspected, repeat the test at appropriate intervals.     ABGS No results for input(s): PHART, PO2ART, TCO2, HCO3 in the last 72 hours.  Invalid input(s): PCO2 CULTURES Recent Results (from the past 240 hour(s))  Blood culture (routine x 2)     Status: None (Preliminary result)   Collection Time: 09/15/14  9:50 AM  Result Value Ref Range  Status   Specimen Description BLOOD LEFT ANTECUBITAL  Final   Special Requests BOTTLES DRAWN AEROBIC AND ANAEROBIC 5CC  Final   Culture   Final    GRAM NEGATIVE RODS Gram Stain Report Called to,Read Back By and Verified With: KEITH A AT [place] ON [address] BY FORSYTH K    Report Status PENDING  Incomplete  Blood culture (routine x 2)     Status: None (Preliminary result)   Collection Time: 09/15/14 10:57 AM  Result Value Ref Range Status   Specimen Description BLOOD LEFT ANTECUBITAL  Final   Special Requests BOTTLES DRAWN AEROBIC AND ANAEROBIC 6CC  Final   Culture   Final    GRAM NEGATIVE RODS Gram Stain Report Called to,Read Back By and Verified With: KEITH A AT [place] ON [address] BY FORSYTH K    Report Status PENDING  Incomplete  MRSA PCR Screening     Status: Abnormal   Collection Time: 09/15/14 12:15 PM  Result Value Ref Range Status   MRSA by PCR POSITIVE (A) NEGATIVE Final    Comment:        The GeneXpert MRSA Assay (FDA approved for NASAL specimens only), is one component of a comprehensive MRSA colonization surveillance program. It is not intended to diagnose MRSA infection nor to guide or monitor treatment for MRSA infections. RESULT CALLED TO, READ BACK BY AND VERIFIED WITH: ROWE,N. AT 1408 ON 09/15/2014 BY BAUGHAM,M.    Studies/Results: US Renal  09/15/2014   CLINICAL DATA:  Recurrent urinary tract infections  EXAM: RENAL/URINARY TRACT ULTRASOUND COMPLETE  COMPARISON:  None.  FINDINGS: Right Kidney:  Length: 15.6 cm. Echogenicity within normal limits. No mass or hydronephrosis visualized.  Left Kidney:  Length: Surgically absent.  Bladder:  Appears normal for degree of bladder distention.  Other:  Gallstones and gallbladder sludge noted.  IMPRESSION: 1. Normal appearance of the right kidney. 2. Status post left nephrectomy.   Electronically Signed   By: Kerby Moors M.D.   On: 09/15/2014 14:10   Dg Chest Port 1 View  09/15/2014   CLINICAL DATA:  Urinary tract infection   EXAM: PORTABLE CHEST - 1 VIEW  COMPARISON:  None.  FINDINGS: Normal cardiothymic silhouette. Mild central venous pulmonary congestion. No effusion, infiltrate, or pneumothorax.  IMPRESSION: Central venous pulmonary congestion.  No focal infiltrate.   Electronically Signed   By: Suzy Bouchard M.D.   On: 09/15/2014 09:32    Medications:  Prior to Admission:  Prescriptions prior to admission  Medication Sig Dispense Refill Last Dose  . ALPRAZolam (XANAX) 1 MG tablet Take 1 mg by mouth at bedtime as needed for sleep.   5 09/14/2014 at Unknown time  . B Complex-C (B-COMPLEX WITH VITAMIN C) tablet Take 1 tablet by mouth daily.   09/14/2014 at Unknown time  . Multiple Vitamin (MULTIVITAMIN WITH MINERALS) TABS tablet Take 1 tablet by mouth daily.   09/14/2014 at Unknown time  . ondansetron (ZOFRAN-ODT) 4 MG disintegrating tablet Take 1 tablet by mouth every 8 (eight) hours as needed for nausea or vomiting.   0 09/14/2014 at Unknown time  . Oxycodone HCl 10 MG TABS Take 1 tablet by mouth 2 (two) times daily as needed (pain).   0 09/14/2014 at Unknown time  . polyethylene glycol (MIRALAX / GLYCOLAX) packet Take 17 g by mouth daily as needed.   Past Week at Unknown time  . Potassium 95 MG TABS Take 1 tablet by mouth daily.   09/14/2014 at Unknown time  . sulfamethoxazole-trimethoprim (BACTRIM DS,SEPTRA DS) 800-160 MG per tablet Take 1 tablet by mouth 2 (two) times daily. Starting 09/12/2014 x 10 days.  0 09/14/2014 at Unknown time  . tamsulosin (FLOMAX) 0.4 MG CAPS capsule Take 0.4 mg by mouth daily.  11 09/14/2014 at Unknown time   Scheduled: . antiseptic oral rinse  7 mL Mouth Rinse BID  . Chlorhexidine Gluconate Cloth  6 each Topical Q0600  . docusate sodium  100 mg Oral BID  . furosemide  40 mg Intravenous BID  . mupirocin ointment  1 application Nasal BID  . piperacillin-tazobactam (ZOSYN)  IV  2.25 g Intravenous 3 times per day  . sodium chloride  3 mL Intravenous Q12H  . tamsulosin  0.4 mg  Oral Daily   Continuous: . sodium chloride 135 mL/hr at 09/16/14 0500   WTU:[id number] **OR** acetaminophen, alum & mag hydroxide-simeth, bisacodyl, ondansetron **OR** ondansetron (ZOFRAN) IV, oxyCODONE  Assesment: He is admitted with acute renal failure which is up apparently multi-factorial and I think includes dehydration, sepsis, urinary tract infection. He's had elevated troponin and probably non-STEMI as a result of his other illnesses. 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 He was encephalopathic on admission but is better now. Renal function has improved somewhat. Principal Problem:   Acute renal failure Active Problems:   UTI (lower urinary tract infection)   Dehydration   Elevated troponin   Thrombocytopenia   Hyponatremia   Sepsis   Acute encephalopathy   NSTEMI (non-ST elevated myocardial infarction)    Plan: Continue current treatments. Cardiology consultation.    LOS: 1 day   Nakoa Ganus L 09/16/2014, 8:23 AM

## 2014-09-16 NOTE — Evaluation (Signed)
Physical Therapy Evaluation Patient Details Name: Ivan [name] MRN: [id number] DOB: September 12, 1946 Today's Date: 09/16/2014   History of Present Illness  Pt is a 68 year old male who was admitted for sepsis secondary to a UTI.  He also has had renal failure.  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  Clinical Impression   This is a very pleasant gentleman who was seen for initial evaluation.  He was found to be moderately deconditioned from baseline due to recent illness.  His transfer ability is adequate but gait with a walker was slightly unsteady. I would recommend HHPT at d/c and pt is agreeable.    Follow Up Recommendations Home health PT    Equipment Recommendations  None recommended by PT    Recommendations for Other Services   none    Precautions / Restrictions Precautions Precautions: Fall Restrictions Weight Bearing Restrictions: No      Mobility  Bed Mobility Overal bed mobility: Modified Independent                Transfers Overall transfer level: Modified independent                  Ambulation/Gait Ambulation/Gait assistance: Supervision Ambulation Distance (Feet): 15 Feet Assistive device: Rolling walker (2 wheeled) Gait Pattern/deviations: Trunk flexed;Decreased stride length;Narrow base of support   Gait velocity interpretation: Below normal speed for age/gender General Gait Details: Pt has a very unusual gait pattern with extremely short steps for his height...he states that this is the way he has walked since his back surgery a few years ago  Financial [employer] (Stroke Patients Only)       Balance Overall balance assessment: No apparent balance deficits (not formally assessed)                                           Pertinent Vitals/Pain Pain Assessment: No/denies pain    Home Living Family/patient expects to be  discharged to:: Private residence Living Arrangements: Spouse/significant other Available Help at Discharge: Family;Available 24 hours/day Type of Home: Mobile home Home Access: Stairs to enter Entrance Stairs-Rails: Right Entrance Stairs-Number of Steps: 2 Home Layout: One level Home Equipment: Walker - 2 wheels;Cane - single point;Bedside commode;Hospital bed;Wheelchair - manual      Prior Function Level of Independence: Independent               Hand Dominance        Extremity/Trunk Assessment               Lower Extremity Assessment: Generalized weakness      Cervical / Trunk Assessment: Normal  Communication   Communication: No difficulties  Cognition Arousal/Alertness: Awake/alert Behavior During Therapy: WFL for tasks assessed/performed Overall Cognitive Status: Within Functional Limits for tasks assessed                      General Comments      Exercises        Assessment/Plan    PT Assessment Patient needs continued PT services  PT Diagnosis Difficulty walking;Generalized weakness   PT Problem List Decreased strength;Decreased activity tolerance;Decreased mobility  PT Treatment Interventions Gait training;Functional mobility training;Therapeutic exercise   PT Goals (Current goals can  be found in the Care Plan section) Acute Rehab PT Goals Patient Stated Goal: none stated PT Goal Formulation: With patient Time For Goal Achievement: 09/30/14 Potential to Achieve Goals: Good    Frequency Min 3X/week   Barriers to discharge   no barriers    Co-evaluation               End of Session Equipment Utilized During Treatment: Gait belt Activity Tolerance: Patient tolerated treatment well Patient left: in chair;with call bell/phone within reach;with chair alarm set;with nursing/sitter in room Nurse Communication: Mobility status         Time: 9741-6384 PT Time Calculation (min) (ACUTE ONLY): 37 min   Charges:   PT  Evaluation $Initial PT Evaluation Tier I: 1 Procedure     PT G CodesDemetrios Isaacs L 09/16/2014, 2:04 PM

## 2014-09-16 NOTE — Consult Note (Signed)
CARDIOLOGY CONSULT NOTE   Patient ID: Ivan [name] MRN: [id number] DOB/AGE: [birth year] 67 y.o.  Admit Date: 09/15/2014 Referring Physician: [name] MD Primary Physician: Alonza Bogus, MD Consulting Cardiologist: Kate Sable MD Primary Cardiologist: New Reason for Consultation: Positive Troponin  Clinical Summary Mr. 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 He has other history to include DDD, with chronic back pain.  He presented with generalized weakness and fatigue, AMS, and anorexia. Found to be in renal failure with creatinine of 4.63, BUN 64. He was has thrombocytopenia with PLTS of 61, believed to be related to DIC from septic UTI. Initial troponin 0.81, but has now increased to 1.73, 2.06 and 1.71 respectively. We are asked for cardiology recommendations.   Review of records from [place] in  October of 2015, demonstrated creatinine of 1.25. EKG demonstrated anterior ST-T wave abnormalities with HR 66 bpm. I spoke with his wife by phone and she states in December of 2014 he had stress test and echo at [facility] through [place], for pre-operative evaluation prior to back surgery.   Patient is still having some confusion and is not a reliable historian.  History is obtained from current and [facility] Everywhere records. Lengthy amount of time in finding history.  He denies further chest pain.   No Known Allergies  Medications Scheduled Medications: . antiseptic oral rinse  7 mL Mouth Rinse BID  . Chlorhexidine Gluconate Cloth  6 each Topical Q0600  . docusate sodium  100 mg Oral BID  . furosemide  40 mg Intravenous BID  . mupirocin ointment  1 application Nasal BID  . piperacillin-tazobactam (ZOSYN)  IV  2.25 g Intravenous 3 times per day  . sodium chloride  3 mL Intravenous Q12H  . tamsulosin  0.4 mg Oral Daily     Infusions: . sodium chloride 135 mL/hr at 09/16/14 0800    PRN Medications: acetaminophen **OR** acetaminophen, alum & mag hydroxide-simeth, benzonatate, bisacodyl, ondansetron **OR** ondansetron (ZOFRAN) IV, oxyCODONE   Past Medical History  Diagnosis Date  . UTI (lower urinary tract infection)   . Chronic back pain   . Kidney stones   . Hypercalcemia     Past Surgical History  Procedure Laterality Date  . Nephrectomy Left     04/2014  . Back surgery      10/2013    Family History  Problem Relation Age of Onset  . Cancer Mother   . Cancer Brother     Social History Mr. Brendlinger reports that he has been smoking.  He does not have any smokeless tobacco history on file. Mr. Barnier reports that he does not drink alcohol.  Review of Systems Complete review of systems are found to be negative unless outlined in H&P above.  Physical Examination Blood pressure 106/59, pulse 80, temperature 98.2 F (36.8 C), temperature source Oral, resp. rate 26, height 5\' 10"  (1.778 m), weight 156 lb 1.4 oz (70.8 kg), SpO2 93 %.  Intake/Output Summary (Last 24 hours) at 09/16/14 0935 Last data filed at 09/16/14 0800  Gross per 24 hour  Intake 2499.58 ml  Output    950 ml  Net 1549.58 ml    Telemetry: NSR, rate in the 90's.   GEN:No acute distress. HEENT: Conjunctiva and lids normal, oropharynx clear with moist mucosa. Neck: Supple, no elevated JVP or carotid bruits, no thyromegaly. Lungs: Clear to auscultation,  nonlabored breathing at rest. Cardiac: Regular rate and rhythm, 1/6 systolic murmur. Extremities: No pitting edema, distal pulses 2+. Skin: Warm and dry. Musculoskeletal: No kyphosis. Neuropsychiatric: Alert and not oriented. affect grossly appropriate.  Prior Cardiac Testing/Procedures Stress test: 11/04/ 2014 (transcribed from records received from [facility])  Normal LV size. Global LV systolic function was normal, with EF of 59%. After attenuation correction,  perfusion and function were normal. (Dr Fuller Song.) Lab Results  Basic Metabolic Panel:  Recent Labs Lab 09/15/14 0921 09/16/14 0347  NA 136* 136*  K 5.0 5.2  CL 101 104  CO2 21 21  GLUCOSE 112* 80  BUN 64* 60*  CREATININE 4.63* 4.03*  CALCIUM 10.8* 10.3  PHOS  --  3.1    CBC:  Recent Labs Lab 09/15/14 0921 09/16/14 0347  WBC 7.5 9.6  NEUTROABS 7.1  --   HGB 13.8 12.6*  HCT 39.1 36.6*  MCV 86.9 88.4  PLT 61* 64*    Cardiac Enzymes:  Recent Labs Lab 09/15/14 0921 09/15/14 1532 09/15/14 2136 09/16/14 0347  TROPONINI 0.81* 1.73* 2.06* 1.71*    Radiology: US Renal  09/15/2014   CLINICAL DATA:  Recurrent urinary tract infections  EXAM: RENAL/URINARY TRACT ULTRASOUND COMPLETE  COMPARISON:  None.  FINDINGS: Right Kidney:  Length: 15.6 cm. Echogenicity within normal limits. No mass or hydronephrosis visualized.  Left Kidney:  Length: Surgically absent.  Bladder:  Appears normal for degree of bladder distention.  Other:  Gallstones and gallbladder sludge noted.  IMPRESSION: 1. Normal appearance of the right kidney. 2. Status post left nephrectomy.   Electronically Signed   By: Kerby Moors M.D.   On: 09/15/2014 14:10   Dg Chest Port 1 View  09/15/2014   CLINICAL DATA:  Urinary tract infection  EXAM: PORTABLE CHEST - 1 VIEW  COMPARISON:  None.  FINDINGS: Normal cardiothymic silhouette. Mild central venous pulmonary congestion. No effusion, infiltrate, or pneumothorax.  IMPRESSION: Central venous pulmonary congestion.  No focal infiltrate.   Electronically Signed   By: Suzy Bouchard M.D.   On: 09/15/2014 09:32     ECG: NSR with inferior/lateral ST-T wave abnormalities.    Impression and Recommendations  1. Elevated Troponin: Likely related to demand ischemia with hypotension, acute renal failure and sepsis. Has has stress test and echo at [facility] in Dec prior to back surgery, and per wife he was found to be normal and cleared to proceed. I hae requested  records from [facility] to confirm and for documentation. Echo is being repeated today. No plans to repeat ischemic testing at this time with other medical issues to include DIC, sepsis, and renal failure. He remains hypotensive.   2. Acute renal failure: Being seen by Dr.Befekadu. Felt related to dehydration, and abx [place], Bactrim. He is getting hydration and continues on lasix.  3. Hx of Left Nephrectomy: Completed at [facility] in July of 2015.  5. Frequent E-Coli UTI: Currently on abx therapy per PCP.     Signed: Phill Myron. Lawrence NP [place]  09/16/2014, 9:35 AM Co-Sign MD

## 2014-09-17 LAB — URINE CULTURE: Colony Count: 75000

## 2014-09-17 LAB — HEPATIC FUNCTION PANEL
ALT: 21 U/L (ref 0–53)
AST: 30 U/L (ref 0–37)
Albumin: 2.4 g/dL — ABNORMAL LOW (ref 3.5–5.2)
Alkaline Phosphatase: 128 U/L — ABNORMAL HIGH (ref 39–117)
BILIRUBIN TOTAL: 1.4 mg/dL — AB (ref 0.3–1.2)
Bilirubin, Direct: 0.7 mg/dL — ABNORMAL HIGH (ref 0.0–0.3)
Indirect Bilirubin: 0.7 mg/dL (ref 0.3–0.9)
TOTAL PROTEIN: 5.9 g/dL — AB (ref 6.0–8.3)

## 2014-09-17 LAB — BASIC METABOLIC PANEL
Anion gap: 14 (ref 5–15)
BUN: 49 mg/dL — ABNORMAL HIGH (ref 6–23)
CO2: 20 mEq/L (ref 19–32)
Calcium: 10.2 mg/dL (ref 8.4–10.5)
Chloride: 103 mEq/L (ref 96–112)
Creatinine, Ser: 3.3 mg/dL — ABNORMAL HIGH (ref 0.50–1.35)
GFR, EST AFRICAN AMERICAN: 21 mL/min — AB (ref >90)
GFR, EST NON AFRICAN AMERICAN: 18 mL/min — AB (ref >90)
Glucose, Bld: 135 mg/dL — ABNORMAL HIGH (ref 70–99)
POTASSIUM: 4.1 meq/L (ref 3.7–5.3)
SODIUM: 137 meq/L (ref 137–147)

## 2014-09-17 LAB — AMMONIA: Ammonia: 16 umol/L (ref 11–60)

## 2014-09-17 LAB — VITAMIN D 25 HYDROXY (VIT D DEFICIENCY, FRACTURES): Vit D, 25-Hydroxy: 21 ng/mL — ABNORMAL LOW (ref 30–100)

## 2014-09-17 MED ORDER — GABAPENTIN 600 MG PO TABS
300.0000 mg | ORAL_TABLET | Freq: Three times a day (TID) | ORAL | Status: DC
  Filled 2014-09-17 (×7): qty 0.5

## 2014-09-17 MED ORDER — HALOPERIDOL LACTATE 5 MG/ML IJ SOLN
10.0000 mg | Freq: Once | INTRAMUSCULAR | Status: AC
  Administered 2014-09-17: 10 mg via INTRAVENOUS
  Filled 2014-09-17: qty 2

## 2014-09-17 MED ORDER — GABAPENTIN 300 MG PO CAPS
300.0000 mg | ORAL_CAPSULE | Freq: Three times a day (TID) | ORAL | Status: DC
  Administered 2014-09-17 – 2014-09-20 (×8): 300 mg via ORAL
  Filled 2014-09-17 (×8): qty 1

## 2014-09-17 MED ORDER — HALOPERIDOL LACTATE 5 MG/ML IJ SOLN: 5.0000 mg | Freq: Once | INTRAMUSCULAR | Status: DC

## 2014-09-17 MED ORDER — PIPERACILLIN-TAZOBACTAM 3.375 G IVPB
3.3750 g | Freq: Three times a day (TID) | INTRAVENOUS | Status: DC
  Administered 2014-09-17 – 2014-09-19 (×5): 3.375 g via INTRAVENOUS
  Filled 2014-09-17 (×7): qty 50

## 2014-09-17 MED ORDER — LORAZEPAM 2 MG/ML IJ SOLN
1.0000 mg | INTRAMUSCULAR | Status: DC | PRN
  Administered 2014-09-17 (×2): 1 mg via INTRAVENOUS
  Filled 2014-09-17 (×2): qty 1

## 2014-09-17 MED ORDER — FUROSEMIDE 10 MG/ML IJ SOLN
20.0000 mg | Freq: Two times a day (BID) | INTRAMUSCULAR | Status: DC
  Administered 2014-09-17 – 2014-09-18 (×2): 20 mg via INTRAVENOUS
  Filled 2014-09-17 (×2): qty 2

## 2014-09-17 NOTE — Progress Notes (Signed)
Subjective: Interval History: Patient offers o complaint. Denies any difficulty in breathing  Objective: Vital signs in last 24 hours: Temp:  [96.9 F (36.1 C)-99.1 F (37.3 C)] 97.2 F (36.2 C) (11/21 0800) Pulse Rate:  [102] 102 (11/21 0400) Resp:  [14-29] 20 (11/21 1000) BP: (97-161)/(50-128) 105/62 mmHg (11/21 1000) SpO2:  [96 %-98 %] 96 % (11/21 0400) Weight change:   Intake/Output from previous day: 11/20 0701 - 11/21 0700 In: 4796.3 [P.O.:1800; I.V.:2846.3; IV Piggyback:150] Out: 1914 [Urine:6150] Intake/Output this shift: Total I/O In: 645 [P.O.:240; I.V.:405] Out: 1000 [Urine:1000]  Generally patient is alert but seems confused to day Chest is clear Heart :RRR  Abdomen is soft and none tender and positive bowl sound Extremities no edema  Lab Results:  Recent Labs  09/15/14 0921 09/16/14 0347  WBC 7.5 9.6  HGB 13.8 12.6*  HCT 39.1 36.6*  PLT 61* 64*   BMET:   Recent Labs  09/16/14 0347 09/17/14 0537  NA 136* 137  K 5.2 4.1  CL 104 103  CO2 21 20  GLUCOSE 80 135*  BUN 60* 49*  CREATININE 4.03* 3.30*  CALCIUM 10.3 10.2   No results for input(Ivan [name]): PTH in the last 72 hours. Iron Studies: No results for input(Ivan [name]): IRON, TIBC, TRANSFERRIN, FERRITIN in the last 72 hours.  Studies/Results: US Renal  09/15/2014   CLINICAL DATA:  Recurrent urinary tract infections  EXAM: RENAL/URINARY TRACT ULTRASOUND COMPLETE  COMPARISON:  None.  FINDINGS: Right Kidney:  Length: 15.6 cm. Echogenicity within normal limits. No mass or hydronephrosis visualized.  Left Kidney:  Length: Surgically absent.  Bladder:  Appears normal for degree of bladder distention.  Other:  Gallstones and gallbladder sludge noted.  IMPRESSION: 1. Normal appearance of the right kidney. 2. Status post left nephrectomy.   Electronically Signed   By: Kerby Moors M.D.   On: 09/15/2014 14:10    I have reviewed the patient'Ivan [name] current medications.  Assessment/Plan: Problem #1 acute kidney injury:  His BUN and creatinine is progressively improving. Patient is none oliguric. Problem #2 hypercalcemia: Most likely from dehydration. His calcium is improving. Presently workup is pending. Problem #3 recurrent UTI on antibiotics he is afebrile and white blood cell count is normal. 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

## 2014-09-17 NOTE — Progress Notes (Signed)
No change patient remains agitated  And restless.  1:1 at bedside, threatening to get oob and

## 2014-09-17 NOTE — Progress Notes (Signed)
ANTIBIOTIC CONSULT NOTE  Pharmacy Consult for Zosyn Indication: UTI / EColi Bacteremia / Sepsis  No Known Allergies  Patient Measurements: Height: 5\' 10"  (177.8 cm) Weight: 156 lb 1.4 oz (70.8 kg) IBW/kg (Calculated) : 73  Vital Signs: Temp: 98.8 F (37.1 C) (11/21 1201) Temp Source: Oral (11/21 1201) BP: 105/62 mmHg (11/21 1000) Pulse Rate: 102 (11/21 0400) Intake/Output from previous day: 11/20 0701 - 11/21 0700 In: 4796.3 [P.O.:1800; I.V.:2846.3; IV Piggyback:150] Out: 6150 [Urine:6150] Intake/Output from this shift: Total I/O In: 765 [P.O.:360; I.V.:405] Out: 1850 [Urine:1850]  Labs:  Recent Labs  09/15/14 0921 09/16/14 0347 09/17/14 0537  WBC 7.5 9.6  --   HGB 13.8 12.6*  --   PLT 61* 64*  --   CREATININE 4.63* 4.03* 3.30*   Estimated Creatinine Clearance: 22.1 mL/min (by C-G formula based on Cr of 3.3). No results for input(s): VANCOTROUGH, VANCOPEAK, VANCORANDOM, GENTTROUGH, GENTPEAK, GENTRANDOM, TOBRATROUGH, TOBRAPEAK, TOBRARND, AMIKACINPEAK, AMIKACINTROU, AMIKACIN in the last 72 hours.   Microbiology: Recent Results (from the past 720 hour(s))  Blood culture (routine x 2)     Status: None (Preliminary result)   Collection Time: 09/15/14  9:50 AM  Result Value Ref Range Status   Specimen Description BLOOD LEFT ANTECUBITAL  Final   Special Requests BOTTLES DRAWN AEROBIC AND ANAEROBIC 5CC  Final   Culture  Setup Time   Final    09/16/2014 14:04 Performed at [facility]    Culture   Final    ESCHERICHIA COLI Note: Gram Stain Report Called to,Read Back By and Verified With: KEITH [email or web address]@0438  ON 09/16/14 BY FORSYTH K Performed at [facility] Performed at [facility]    Report Status PENDING  Incomplete  Urine culture     Status: None (Preliminary result)   Collection Time: 09/15/14 10:02 AM  Result Value Ref Range Status   Specimen Description URINE, CATHETERIZED  Final   Special Requests NONE  Final   Culture  Setup Time   Final     09/15/2014 14:54 Performed at [facility]   Final    75,000 COLONIES/ML Performed at [facility]    Culture   Final    ESCHERICHIA COLI Performed at [facility]    Report Status PENDING  Incomplete  Blood culture (routine x 2)     Status: None (Preliminary result)   Collection Time: 09/15/14 10:57 AM  Result Value Ref Range Status   Specimen Description BLOOD LEFT ANTECUBITAL  Final   Special Requests BOTTLES DRAWN AEROBIC AND ANAEROBIC [facility]  Final   Culture  Setup Time   Final    09/16/2014 14:07 Performed at [facility]    Culture   Final    ESCHERICHIA COLI Note: Gram Stain Report Called to,Read Back By and Verified With: KEITH A@0438  ON 09/16/14 BY FORSYTH K Performed at [facility] Performed at [facility]    Report Status PENDING  Incomplete  MRSA PCR Screening     Status: Abnormal   Collection Time: 09/15/14 12:15 PM  Result Value Ref Range Status   MRSA by PCR POSITIVE (A) NEGATIVE Final    Comment:        The GeneXpert MRSA Assay (FDA approved for NASAL specimens only), is one component of a comprehensive MRSA colonization surveillance program. It is not intended to diagnose MRSA infection nor to guide or monitor treatment for MRSA infections. RESULT CALLED TO, READ BACK BY AND VERIFIED WITH:  ROWE,N. AT 1408 ON 09/15/2014 BY BAUGHAM,M.     Anti-infectives    Start     Dose/Rate Route Frequency Ordered Stop   09/17/14 1400  piperacillin-tazobactam (ZOSYN) IVPB 3.375 g     3.375 g12.5 mL/hr over 240 Minutes Intravenous 3 times per day 09/17/14 1307     09/16/14 1000  cefTRIAXone (ROCEPHIN) 1 g in dextrose 5 % 50 mL IVPB - Premix  Status:  Discontinued     1 g100 mL/hr over 30 Minutes Intravenous Every 24 hours 09/15/14 1200 09/15/14 1329   09/15/14 1400  piperacillin-tazobactam (ZOSYN) IVPB 2.25 g  Status:  Discontinued     2.25 g100 mL/hr over 30 Minutes Intravenous 3 times per day  09/15/14 1342 09/17/14 1307   09/15/14 1045  cefTRIAXone (ROCEPHIN) 1 g in dextrose 5 % 50 mL IVPB     1 g100 mL/hr over 30 Minutes Intravenous  Once 09/15/14 1044 09/15/14 1146      Assessment: 87 yoM admitted with urosepsis and acute renal failure, renal function improving.  EColi noted, sensitivities pending.  Rocephin 11/19>>11/19 Bactrim 11/16>>11/19 Zosyn 11/19>>  Goal of Therapy:  Eradicate infection.  Plan:  Increase Zosyn to 3.375gm IV every 8 hours. Follow-up micro data, labs, vitals.  Pricilla Larsson, [facility] 09/17/2014  1:10 PM

## 2014-09-17 NOTE — Progress Notes (Signed)
PT CONTINUES TO HAVE OCCASIONAL  EPISODES OF HICCOUGHS  11/20 AND 11/21.  PT IS NOW RESTING QUIETLY.

## 2014-09-17 NOTE — Progress Notes (Signed)
PT REMAINS VERY CONFUSED AND RESTLESS. 1;1 SUPERVISION IS BEING REQUIRED TO KEEP PT SAFE.

## 2014-09-17 NOTE — Progress Notes (Signed)
Subjective: He has been very agitated. I discussed this with his wife. He did have some alcohol about a week ago but he is not a daily alcohol drinker and doesn't drink very frequently. He did receive his Xanax so I don't think he is withdrawing from that. 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"  Objective: Vital signs in last 24 hours: Temp:  [96.9 F (36.1 C)-99.1 F (37.3 C)] 97.2 F (36.2 C) (11/21 0800) Pulse Rate:  [102] 102 (11/21 0400) Resp:  [14-29] 14 (11/21 0900) BP: (97-161)/(50-128) 102/54 mmHg (11/21 0900) SpO2:  [96 %-98 %] 96 % (11/21 0400) Weight change:  Last BM Date: 09/16/14  Intake/Output from previous day: 11/20 0701 - 11/21 0700 In: 4796.3 [P.O.:1800; I.V.:2846.3; IV Piggyback:150] Out: 2010 [Urine:6150]  PHYSICAL EXAM General appearance: alert, mild distress and Confused and agitated Resp: clear to auscultation bilaterally Cardio: regular rate and rhythm, S1, S2 normal, no murmur, click, rub or gallop GI: soft, non-tender; bowel sounds normal; no masses,  no organomegaly Extremities: extremities normal, atraumatic, no cyanosis or edema  Lab Results:  Results for orders placed or performed during the hospital encounter of 09/15/14 (from the past 48 hour(s))  Blood culture (routine x 2)     Status: None (Preliminary result)   Collection Time: 09/15/14 10:57 AM  Result Value Ref Range   Specimen Description BLOOD LEFT ANTECUBITAL    Special Requests BOTTLES DRAWN AEROBIC AND ANAEROBIC [place]    Culture  Setup Time      09/16/2014 14:07 Performed at [facility] Note: Gram Stain Report Called to,Read Back By and Verified With: KEITH A'@0438'  ON 09/16/14 BY FORSYTH K Performed at [facility] Performed at [facility]    Report Status PENDING   MRSA PCR Screening     Status: Abnormal   Collection Time: 09/15/14 12:15 PM  Result Value Ref Range   MRSA by PCR POSITIVE (A) NEGATIVE    Comment:        The  GeneXpert MRSA Assay (FDA approved for NASAL specimens only), is one component of a comprehensive MRSA colonization surveillance program. It is not intended to diagnose MRSA infection nor to guide or monitor treatment for MRSA infections. RESULT CALLED TO, READ BACK BY AND VERIFIED WITH: ROWE,N. AT 1408 ON 09/15/2014 BY BAUGHAM,M.   Troponin I     Status: Abnormal   Collection Time: 09/15/14  3:32 PM  Result Value Ref Range   Troponin I 1.73 (HH) <0.30 ng/mL    Comment:        Due to the release kinetics of cTnI, a negative result within the first hours of the onset of symptoms does not rule out myocardial infarction with certainty. If myocardial infarction is still suspected, repeat the test at appropriate intervals. CRITICAL VALUE NOTED.  VALUE IS CONSISTENT WITH PREVIOUSLY REPORTED AND CALLED VALUE.   Troponin I     Status: Abnormal   Collection Time: 09/15/14  9:36 PM  Result Value Ref Range   Troponin I 2.06 (HH) <0.30 ng/mL    Comment:        Due to the release kinetics of cTnI, a negative result within the first hours of the onset of symptoms does not rule out myocardial infarction with certainty. If myocardial infarction is still suspected, repeat the test at appropriate intervals. CRITICAL VALUE NOTED.  VALUE IS CONSISTENT WITH PREVIOUSLY REPORTED AND CALLED VALUE.   Basic metabolic panel  Status: Abnormal   Collection Time: 09/16/14  3:47 AM  Result Value Ref Range   Sodium 136 (L) 137 - 147 mEq/L   Potassium 5.2 3.7 - 5.3 mEq/L   Chloride 104 96 - 112 mEq/L   CO2 21 19 - 32 mEq/L   Glucose, Bld 80 70 - 99 mg/dL   BUN 60 (H) 6 - 23 mg/dL   Creatinine, Ser 4.03 (H) 0.50 - 1.35 mg/dL   Calcium 10.3 8.4 - 10.5 mg/dL   GFR calc non Af Amer 14 (L) >90 mL/min   GFR calc Af Amer 16 (L) >90 mL/min    Comment: (NOTE) The eGFR has been calculated using the CKD EPI equation. This calculation has not been validated in all clinical situations. eGFR's persistently  <90 mL/min signify possible Chronic Kidney Disease.    Anion gap 11 5 - 15  CBC     Status: Abnormal   Collection Time: 09/16/14  3:47 AM  Result Value Ref Range   WBC 9.6 4.0 - 10.5 K/uL   RBC 4.14 (L) 4.22 - 5.81 MIL/uL   Hemoglobin 12.6 (L) 13.0 - 17.0 g/dL   HCT 36.6 (L) 39.0 - 52.0 %   MCV 88.4 78.0 - 100.0 fL   MCH 30.4 26.0 - 34.0 pg   MCHC 34.4 30.0 - 36.0 g/dL   RDW 15.0 11.5 - 15.5 %   Platelets 64 (L) 150 - 400 K/uL    Comment: SPECIMEN CHECKED FOR CLOTS CONSISTENT WITH PREVIOUS RESULT   Phosphorus     Status: None   Collection Time: 09/16/14  3:47 AM  Result Value Ref Range   Phosphorus 3.1 2.3 - 4.6 mg/dL  Vit D  25 hydroxy (rtn osteoporosis monitoring)     Status: Abnormal   Collection Time: 09/16/14  3:47 AM  Result Value Ref Range   Vit D, 25-Hydroxy 21 (L) 30 - 100 ng/mL    Comment: (NOTE) ** Please note change in reference range(s). ** Vitamin D Status           25-OH Vitamin D       Deficiency                <20 ng/mL       Insufficiency         20 - 29 ng/mL       Optimal             > or = 30 ng/mL For 25-OH Vitamin D testing on patients on D2-supplementation and patients for whom quantitation of D2 and D3 fractions is required, the QuestAssureD 25-OH VIT D, (D2,D3), LC/MS/MS is recommended: order code 618 [id number] 8020 (patients > 2 yrs). Performed at [facility]   Troponin I     Status: Abnormal   Collection Time: 09/16/14  3:47 AM  Result Value Ref Range   Troponin I 1.71 (HH) <0.30 ng/mL    Comment: CRITICAL VALUE NOTED.  VALUE IS CONSISTENT WITH PREVIOUSLY REPORTED AND CALLED VALUE.        Due to the release kinetics of cTnI, a negative result within the first hours of the onset of symptoms does not rule out myocardial infarction with certainty. If myocardial infarction is still suspected, repeat the test at appropriate intervals.   Urinalysis, Routine w reflex microscopic     Status: Abnormal   Collection Time: 09/16/14  9:30 AM  Result  Value Ref Range   Color, Urine YELLOW YELLOW   APPearance CLEAR CLEAR   Specific  Gravity, Urine 1.010 1.005 - 1.030   pH 6.0 5.0 - 8.0   Glucose, UA NEGATIVE NEGATIVE mg/dL   Hgb urine dipstick SMALL (A) NEGATIVE   Bilirubin Urine NEGATIVE NEGATIVE   Ketones, ur NEGATIVE NEGATIVE mg/dL   Protein, ur 30 (A) NEGATIVE mg/dL   Urobilinogen, UA 2.0 (H) 0.0 - 1.0 mg/dL   Nitrite NEGATIVE NEGATIVE   Leukocytes, UA TRACE (A) NEGATIVE  Urine microscopic-add on     Status: None   Collection Time: 09/16/14  9:30 AM  Result Value Ref Range   WBC, UA 3-6 <3 WBC/hpf   RBC / HPF 3-6 <3 RBC/hpf  Basic metabolic panel     Status: Abnormal   Collection Time: 09/17/14  5:37 AM  Result Value Ref Range   Sodium 137 137 - 147 mEq/L   Potassium 4.1 3.7 - 5.3 mEq/L    Comment: DELTA CHECK NOTED   Chloride 103 96 - 112 mEq/L   CO2 20 19 - 32 mEq/L   Glucose, Bld 135 (H) 70 - 99 mg/dL   BUN 49 (H) 6 - 23 mg/dL   Creatinine, Ser 3.30 (H) 0.50 - 1.35 mg/dL   Calcium 10.2 8.4 - 10.5 mg/dL   GFR calc non Af Amer 18 (L) >90 mL/min   GFR calc Af Amer 21 (L) >90 mL/min    Comment: (NOTE) The eGFR has been calculated using the CKD EPI equation. This calculation has not been validated in all clinical situations. eGFR's persistently <90 mL/min signify possible Chronic Kidney Disease.    Anion gap 14 5 - 15    ABGS No results for input(s): PHART, PO2ART, TCO2, HCO3 in the last 72 hours.  Invalid input(s): PCO2 CULTURES Recent Results (from the past 240 hour(s))  Blood culture (routine x 2)     Status: None (Preliminary result)   Collection Time: 09/15/14  9:50 AM  Result Value Ref Range Status   Specimen Description BLOOD LEFT ANTECUBITAL  Final   Special Requests BOTTLES DRAWN AEROBIC AND ANAEROBIC 5CC  Final   Culture  Setup Time   Final    09/16/2014 14:04 Performed at [facility]    Culture   Final    ESCHERICHIA COLI Note: Gram Stain Report Called to,Read Back By and Verified With:  KEITH A'@0438'  ON 09/16/14 BY FORSYTH K Performed at [facility] Performed at [facility]    Report Status PENDING  Incomplete  Urine culture     Status: None (Preliminary result)   Collection Time: 09/15/14 10:02 AM  Result Value Ref Range Status   Specimen Description URINE, CATHETERIZED  Final   Special Requests NONE  Final   Culture  Setup Time   Final    09/15/2014 14:54 Performed at [place]   Final    75,000 COLONIES/ML Performed at [facility]    Culture   Final    ESCHERICHIA COLI Performed at [facility]    Report Status PENDING  Incomplete  Blood culture (routine x 2)     Status: None (Preliminary result)   Collection Time: 09/15/14 10:57 AM  Result Value Ref Range Status   Specimen Description BLOOD LEFT ANTECUBITAL  Final   Special Requests BOTTLES DRAWN AEROBIC AND ANAEROBIC [facility]  Final   Culture  Setup Time   Final    09/16/2014 14:07 Performed at [facility]    Culture   Final    ESCHERICHIA COLI Note: Gram  Stain Report Called to,Read Back By and Verified With: KEITH A'@0438'  ON 09/16/14 BY FORSYTH K Performed at [facility] Performed at [facility]    Report Status PENDING  Incomplete  MRSA PCR Screening     Status: Abnormal   Collection Time: 09/15/14 12:15 PM  Result Value Ref Range Status   MRSA by PCR POSITIVE (A) NEGATIVE Final    Comment:        The GeneXpert MRSA Assay (FDA approved for NASAL specimens only), is one component of a comprehensive MRSA colonization surveillance program. It is not intended to diagnose MRSA infection nor to guide or monitor treatment for MRSA infections. RESULT CALLED TO, READ BACK BY AND VERIFIED WITH: ROWE,N. AT 1408 ON 09/15/2014 BY BAUGHAM,M.    Studies/Results: US Renal  09/15/2014   CLINICAL DATA:  Recurrent urinary tract infections  EXAM: RENAL/URINARY TRACT ULTRASOUND COMPLETE  COMPARISON:  None.  FINDINGS: Right  Kidney:  Length: 15.6 cm. Echogenicity within normal limits. No mass or hydronephrosis visualized.  Left Kidney:  Length: Surgically absent.  Bladder:  Appears normal for degree of bladder distention.  Other:  Gallstones and gallbladder sludge noted.  IMPRESSION: 1. Normal appearance of the right kidney. 2. Status post left nephrectomy.   Electronically Signed   By: Kerby Moors M.D.   On: 09/15/2014 14:10    Medications:  Prior to Admission:  Prescriptions prior to admission  Medication Sig Dispense Refill Last Dose  . ALPRAZolam (XANAX) 1 MG tablet Take 1 mg by mouth at bedtime as needed for sleep.   5 09/14/2014 at Unknown time  . B Complex-C (B-COMPLEX WITH VITAMIN C) tablet Take 1 tablet by mouth daily.   09/14/2014 at Unknown time  . Multiple Vitamin (MULTIVITAMIN WITH MINERALS) TABS tablet Take 1 tablet by mouth daily.   09/14/2014 at Unknown time  . ondansetron (ZOFRAN-ODT) 4 MG disintegrating tablet Take 1 tablet by mouth every 8 (eight) hours as needed for nausea or vomiting.   0 09/14/2014 at Unknown time  . Oxycodone HCl 10 MG TABS Take 1 tablet by mouth 2 (two) times daily as needed (pain).   0 09/14/2014 at Unknown time  . polyethylene glycol (MIRALAX / GLYCOLAX) packet Take 17 g by mouth daily as needed.   Past Week at Unknown time  . Potassium 95 MG TABS Take 1 tablet by mouth daily.   09/14/2014 at Unknown time  . sulfamethoxazole-trimethoprim (BACTRIM DS,SEPTRA DS) 800-160 MG per tablet Take 1 tablet by mouth 2 (two) times daily. Starting 09/12/2014 x 10 days.  0 09/14/2014 at Unknown time  . tamsulosin (FLOMAX) 0.4 MG CAPS capsule Take 0.4 mg by mouth daily.  11 09/14/2014 at Unknown time   Scheduled: . antiseptic oral rinse  7 mL Mouth Rinse BID  . Chlorhexidine Gluconate Cloth  6 each Topical Q0600  . docusate sodium  100 mg Oral BID  . feeding supplement (ENSURE COMPLETE)  237 mL Oral BID BM  . furosemide  40 mg Intravenous BID  . mupirocin ointment  1 application Nasal  BID  . piperacillin-tazobactam (ZOSYN)  IV  2.25 g Intravenous 3 times per day  . sodium chloride  3 mL Intravenous Q12H  . tamsulosin  0.4 mg Oral Daily   Continuous: . sodium chloride 135 mL/hr at 09/17/14 0900   YOM:[id number] **OR** acetaminophen, ALPRAZolam, alum & mag hydroxide-simeth, benzonatate, bisacodyl, ondansetron **OR** ondansetron (ZOFRAN) IV, oxyCODONE  Assesment: He was admitted with sepsis. He has positive blood cultures. He has  been encephalopathic and I think it's related to his sepsis and perhaps to being in the intensive care unit. I do not think this is drug or alcohol withdrawal based on his history. His renal function is improving. He seems better as far as sepsis is concerned Principal Problem:   Acute renal failure Active Problems:   UTI (lower urinary tract infection)   Dehydration   Elevated troponin   Thrombocytopenia   Hyponatremia   Sepsis   Acute encephalopathy   NSTEMI (non-ST elevated myocardial infarction)    Plan: Provide medication to help with agitation. Continue with his other medicines.    LOS: 2 days   Faisal Stradling L 09/17/2014, 10:37 AM

## 2014-09-18 LAB — COMPREHENSIVE METABOLIC PANEL
ALBUMIN: 2.3 g/dL — AB (ref 3.5–5.2)
ALK PHOS: 114 U/L (ref 39–117)
ALT: 17 U/L (ref 0–53)
AST: 17 U/L (ref 0–37)
Anion gap: 13 (ref 5–15)
BUN: 39 mg/dL — ABNORMAL HIGH (ref 6–23)
CO2: 23 mEq/L (ref 19–32)
Calcium: 10.2 mg/dL (ref 8.4–10.5)
Chloride: 108 mEq/L (ref 96–112)
Creatinine, Ser: 2.75 mg/dL — ABNORMAL HIGH (ref 0.50–1.35)
GFR calc Af Amer: 26 mL/min — ABNORMAL LOW (ref >90)
GFR calc non Af Amer: 22 mL/min — ABNORMAL LOW (ref >90)
GLUCOSE: 100 mg/dL — AB (ref 70–99)
POTASSIUM: 3.8 meq/L (ref 3.7–5.3)
Sodium: 144 mEq/L (ref 137–147)
Total Bilirubin: 1.5 mg/dL — ABNORMAL HIGH (ref 0.3–1.2)
Total Protein: 6 g/dL (ref 6.0–8.3)

## 2014-09-18 LAB — CBC WITH DIFFERENTIAL/PLATELET
Basophils Absolute: 0 10*3/uL (ref 0.0–0.1)
Basophils Relative: 0 % (ref 0–1)
Eosinophils Absolute: 0.1 10*3/uL (ref 0.0–0.7)
Eosinophils Relative: 1 % (ref 0–5)
HEMATOCRIT: 36.7 % — AB (ref 39.0–52.0)
HEMOGLOBIN: 12.7 g/dL — AB (ref 13.0–17.0)
Lymphocytes Relative: 6 % — ABNORMAL LOW (ref 12–46)
Lymphs Abs: 0.6 10*3/uL — ABNORMAL LOW (ref 0.7–4.0)
MCH: 30.4 pg (ref 26.0–34.0)
MCHC: 34.6 g/dL (ref 30.0–36.0)
MCV: 87.8 fL (ref 78.0–100.0)
Monocytes Absolute: 0.9 10*3/uL (ref 0.1–1.0)
Monocytes Relative: 10 % (ref 3–12)
NEUTROS ABS: 7.6 10*3/uL (ref 1.7–7.7)
Neutrophils Relative %: 83 % — ABNORMAL HIGH (ref 43–77)
Platelets: 154 10*3/uL (ref 150–400)
RBC: 4.18 MIL/uL — ABNORMAL LOW (ref 4.22–5.81)
RDW: 14.6 % (ref 11.5–15.5)
WBC: 9.2 10*3/uL (ref 4.0–10.5)

## 2014-09-18 LAB — CULTURE, BLOOD (ROUTINE X 2)

## 2014-09-18 MED ORDER — NICOTINE 21 MG/24HR TD PT24
21.0000 mg | MEDICATED_PATCH | Freq: Every day | TRANSDERMAL | Status: DC
  Administered 2014-09-18 – 2014-09-20 (×3): 21 mg via TRANSDERMAL
  Filled 2014-09-18 (×3): qty 1

## 2014-09-18 NOTE — Progress Notes (Signed)
Subjective: Interval History: Patient very sleepy but arousable  Objective: Vital signs in last 24 hours: Temp:  [96.9 F (36.1 C)-98.8 F (37.1 C)] 98.7 F (37.1 C) (11/22 0723) Resp:  [10-33] 28 (11/22 0700) BP: (98-155)/(42-104) 108/69 mmHg (11/22 0700) SpO2:  [96 %-97 %] 96 % (11/22 0400) Weight:  [65.2 kg (143 lb 11.8 oz)] 65.2 kg (143 lb 11.8 oz) (11/22 0300) Weight change:   Intake/Output from previous day: 11/21 0701 - 11/22 0700 In: 4113 [P.O.:720; I.V.:3243; IV Piggyback:150] Out: 4950 [Urine:4950] Intake/Output this shift:    Generally patient is sleepy today and in no apparetn distress Chest is clear Heart :RRR  Abdomen is soft and none tender and positive bowl sound Extremities no edema  Lab Results:  Recent Labs  09/16/14 0347 09/18/14 0530  WBC 9.6 9.2  HGB 12.6* 12.7*  HCT 36.6* 36.7*  PLT 64* 154   BMET:   Recent Labs  09/17/14 0537 09/18/14 0530  NA 137 144  K 4.1 3.8  CL 103 108  CO2 20 23  GLUCOSE 135* 100*  BUN 49* 39*  CREATININE 3.30* 2.75*  CALCIUM 10.2 10.2   No results for input(s): PTH in the last 72 hours. Iron Studies: No results for input(s): IRON, TIBC, TRANSFERRIN, FERRITIN in the last 72 hours.  Studies/Results: No results found.  I have reviewed the patient's current medications.  Assessment/Plan: Problem #1 acute kidney injury: His renal function is improving Problem #2 hypercalcemia: Most likely from dehydration. His calcium is stable but remains high . His 25, vitamin D level is low his PTH is still pending Problem #3 recurrent UTI : presently from [facility] . 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

## 2014-09-18 NOTE — Progress Notes (Signed)
Subjective: He says he feels a little better. He is less confused. He is still confused but less so.  Objective: Vital signs in last 24 hours: Temp:  [96.9 F (36.1 C)-98.8 F (37.1 C)] 98.7 F (37.1 C) (11/22 0723) Resp:  [10-33] 28 (11/22 0700) BP: (98-155)/(42-104) 108/69 mmHg (11/22 0700) SpO2:  [96 %-97 %] 96 % (11/22 0400) Weight:  [65.2 kg (143 lb 11.8 oz)] 65.2 kg (143 lb 11.8 oz) (11/22 0300) Weight change:  Last BM Date: 09/18/14  Intake/Output from previous day: 11/21 0701 - 11/22 0700 In: 4113 [P.O.:720; I.V.:3243; IV Piggyback:150] Out: 4950 [Urine:4950]  PHYSICAL EXAM General appearance: alert and mild distress Resp: clear to auscultation bilaterally Cardio: regular rate and rhythm, S1, S2 normal, no murmur, click, rub or gallop GI: soft, non-tender; bowel sounds normal; no masses,  no organomegaly Extremities: extremities normal, atraumatic, no cyanosis or edema  Lab Results:  Results for orders placed or performed during the hospital encounter of 09/15/14 (from the past 48 hour(s))  Urinalysis, Routine w reflex microscopic     Status: Abnormal   Collection Time: 09/16/14  9:30 AM  Result Value Ref Range   Color, Urine YELLOW YELLOW   APPearance CLEAR CLEAR   Specific Gravity, Urine 1.010 1.005 - 1.030   pH 6.0 5.0 - 8.0   Glucose, UA NEGATIVE NEGATIVE mg/dL   Hgb urine dipstick SMALL (A) NEGATIVE   Bilirubin Urine NEGATIVE NEGATIVE   Ketones, ur NEGATIVE NEGATIVE mg/dL   Protein, ur 30 (A) NEGATIVE mg/dL   Urobilinogen, UA 2.0 (H) 0.0 - 1.0 mg/dL   Nitrite NEGATIVE NEGATIVE   Leukocytes, UA TRACE (A) NEGATIVE  Urine microscopic-add on     Status: None   Collection Time: 09/16/14  9:30 AM  Result Value Ref Range   WBC, UA 3-6 <3 WBC/hpf   RBC / HPF 3-6 <3 RBC/hpf  Hepatic function panel     Status: Abnormal   Collection Time: 09/17/14  5:32 AM  Result Value Ref Range   Total Protein 5.9 (L) 6.0 - 8.3 g/dL   Albumin 2.4 (L) 3.5 - 5.2 g/dL   AST 30  0 - 37 U/L   ALT 21 0 - 53 U/L   Alkaline Phosphatase 128 (H) 39 - 117 U/L   Total Bilirubin 1.4 (H) 0.3 - 1.2 mg/dL   Bilirubin, Direct 0.7 (H) 0.0 - 0.3 mg/dL   Indirect Bilirubin 0.7 0.3 - 0.9 mg/dL  Basic metabolic panel     Status: Abnormal   Collection Time: 09/17/14  5:37 AM  Result Value Ref Range   Sodium 137 137 - 147 mEq/L   Potassium 4.1 3.7 - 5.3 mEq/L    Comment: DELTA CHECK NOTED   Chloride 103 96 - 112 mEq/L   CO2 20 19 - 32 mEq/L   Glucose, Bld 135 (H) 70 - 99 mg/dL   BUN 49 (H) 6 - 23 mg/dL   Creatinine, Ser 3.30 (H) 0.50 - 1.35 mg/dL   Calcium 10.2 8.4 - 10.5 mg/dL   GFR calc non Af Amer 18 (L) >90 mL/min   GFR calc Af Amer 21 (L) >90 mL/min    Comment: (NOTE) The eGFR has been calculated using the CKD EPI equation. This calculation has not been validated in all clinical situations. eGFR's persistently <90 mL/min signify possible Chronic Kidney Disease.    Anion gap 14 5 - 15  Ammonia     Status: None   Collection Time: 09/17/14 10:31 AM  Result Value  Ref Range   Ammonia 16 11 - 60 umol/L  CBC with Differential     Status: Abnormal   Collection Time: 09/18/14  5:30 AM  Result Value Ref Range   WBC 9.2 4.0 - 10.5 K/uL   RBC 4.18 (L) 4.22 - 5.81 MIL/uL   Hemoglobin 12.7 (L) 13.0 - 17.0 g/dL   HCT 36.7 (L) 39.0 - 52.0 %   MCV 87.8 78.0 - 100.0 fL   MCH 30.4 26.0 - 34.0 pg   MCHC 34.6 30.0 - 36.0 g/dL   RDW 14.6 11.5 - 15.5 %   Platelets 154 150 - 400 K/uL   Neutrophils Relative % 83 (H) 43 - 77 %   Lymphocytes Relative 6 (L) 12 - 46 %   Monocytes Relative 10 3 - 12 %   Eosinophils Relative 1 0 - 5 %   Basophils Relative 0 0 - 1 %   Neutro Abs 7.6 1.7 - 7.7 K/uL   Lymphs Abs 0.6 (L) 0.7 - 4.0 K/uL   Monocytes Absolute 0.9 0.1 - 1.0 K/uL   Eosinophils Absolute 0.1 0.0 - 0.7 K/uL   Basophils Absolute 0.0 0.0 - 0.1 K/uL   WBC Morphology ATYPICAL LYMPHOCYTES    Smear Review LARGE PLATELETS PRESENT   Comprehensive metabolic panel     Status: Abnormal    Collection Time: 09/18/14  5:30 AM  Result Value Ref Range   Sodium 144 137 - 147 mEq/L    Comment: DELTA CHECK NOTED   Potassium 3.8 3.7 - 5.3 mEq/L   Chloride 108 96 - 112 mEq/L   CO2 23 19 - 32 mEq/L   Glucose, Bld 100 (H) 70 - 99 mg/dL   BUN 39 (H) 6 - 23 mg/dL   Creatinine, Ser 2.75 (H) 0.50 - 1.35 mg/dL   Calcium 10.2 8.4 - 10.5 mg/dL   Total Protein 6.0 6.0 - 8.3 g/dL   Albumin 2.3 (L) 3.5 - 5.2 g/dL   AST 17 0 - 37 U/L   ALT 17 0 - 53 U/L   Alkaline Phosphatase 114 39 - 117 U/L   Total Bilirubin 1.5 (H) 0.3 - 1.2 mg/dL   GFR calc non Af Amer 22 (L) >90 mL/min   GFR calc Af Amer 26 (L) >90 mL/min    Comment: (NOTE) The eGFR has been calculated using the CKD EPI equation. This calculation has not been validated in all clinical situations. eGFR's persistently <90 mL/min signify possible Chronic Kidney Disease.    Anion gap 13 5 - 15    ABGS No results for input(s): PHART, PO2ART, TCO2, HCO3 in the last 72 hours.  Invalid input(s): PCO2 CULTURES Recent Results (from the past 240 hour(s))  Blood culture (routine x 2)     Status: None   Collection Time: 09/15/14  9:50 AM  Result Value Ref Range Status   Specimen Description BLOOD LEFT ANTECUBITAL  Final   Special Requests BOTTLES DRAWN AEROBIC AND ANAEROBIC 5CC  Final   Culture  Setup Time   Final    09/16/2014 14:04 Performed at [place]   Final    ESCHERICHIA COLI Note: Gram Stain Report Called to,Read Back By and Verified With: KEITH A'@0438'  ON 09/16/14 BY Mikel Cella K Performed at [facility] Performed at [facility]    Report Status 09/18/2014 FINAL  Final   Organism ID, Bacteria ESCHERICHIA COLI  Final      Susceptibility   Escherichia coli - MIC*  AMPICILLIN >=32 RESISTANT Resistant     AMPICILLIN/SULBACTAM >=32 RESISTANT Resistant     CEFAZOLIN 8 SENSITIVE Sensitive     CEFEPIME <=1 SENSITIVE Sensitive     CEFTAZIDIME <=1 SENSITIVE Sensitive     CEFTRIAXONE  <=1 SENSITIVE Sensitive     CIPROFLOXACIN >=4 RESISTANT Resistant     GENTAMICIN <=1 SENSITIVE Sensitive     IMIPENEM <=0.25 SENSITIVE Sensitive     PIP/TAZO <=4 SENSITIVE Sensitive     TOBRAMYCIN <=1 SENSITIVE Sensitive     TRIMETH/SULFA >=320 RESISTANT Resistant     * ESCHERICHIA COLI  Urine culture     Status: None   Collection Time: 09/15/14 10:02 AM  Result Value Ref Range Status   Specimen Description URINE, CATHETERIZED  Final   Special Requests NONE  Final   Culture  Setup Time   Final    09/15/2014 14:54 Performed at [place]   Final    75,000 COLONIES/ML Performed at [facility]    Culture   Final    ESCHERICHIA COLI Performed at [facility]    Report Status 09/17/2014 FINAL  Final   Organism ID, Bacteria ESCHERICHIA COLI  Final      Susceptibility   Escherichia coli - MIC*    AMPICILLIN >=32 RESISTANT Resistant     CEFAZOLIN <=4 SENSITIVE Sensitive     CEFTRIAXONE <=1 SENSITIVE Sensitive     CIPROFLOXACIN >=4 RESISTANT Resistant     GENTAMICIN <=1 SENSITIVE Sensitive     LEVOFLOXACIN >=8 RESISTANT Resistant     NITROFURANTOIN <=16 SENSITIVE Sensitive     TOBRAMYCIN <=1 SENSITIVE Sensitive     TRIMETH/SULFA >=320 RESISTANT Resistant     PIP/TAZO <=4 SENSITIVE Sensitive     * ESCHERICHIA COLI  Blood culture (routine x 2)     Status: None   Collection Time: 09/15/14 10:57 AM  Result Value Ref Range Status   Specimen Description BLOOD LEFT ANTECUBITAL  Final   Special Requests BOTTLES DRAWN AEROBIC AND ANAEROBIC [facility]  Final   Culture  Setup Time   Final    09/16/2014 14:07 Performed at [facility]    Culture   Final    ESCHERICHIA COLI Note: SUSCEPTIBILITIES PERFORMED ON PREVIOUS CULTURE WITHIN THE LAST 5 DAYS. Note: Gram Stain Report Called to,Read Back By and Verified With: KEITH A'@0438'  ON 09/16/14 BY FORSYTH K Performed at [facility] Performed at [facility]    Report Status  09/18/2014 FINAL  Final  MRSA PCR Screening     Status: Abnormal   Collection Time: 09/15/14 12:15 PM  Result Value Ref Range Status   MRSA by PCR POSITIVE (A) NEGATIVE Final    Comment:        The GeneXpert MRSA Assay (FDA approved for NASAL specimens only), is one component of a comprehensive MRSA colonization surveillance program. It is not intended to diagnose MRSA infection nor to guide or monitor treatment for MRSA infections. RESULT CALLED TO, READ BACK BY AND VERIFIED WITH: ROWE,N. AT 1408 ON 09/15/2014 BY BAUGHAM,M.    Studies/Results: No results found.  Medications:  Prior to Admission:  Prescriptions prior to admission  Medication Sig Dispense Refill Last Dose  . ALPRAZolam (XANAX) 1 MG tablet Take 1 mg by mouth at bedtime as needed for sleep.   5 09/14/2014 at Unknown time  . B Complex-C (B-COMPLEX WITH VITAMIN C) tablet Take 1 tablet by mouth daily.   09/14/2014 at Unknown time  .  Multiple Vitamin (MULTIVITAMIN WITH MINERALS) TABS tablet Take 1 tablet by mouth daily.   09/14/2014 at Unknown time  . ondansetron (ZOFRAN-ODT) 4 MG disintegrating tablet Take 1 tablet by mouth every 8 (eight) hours as needed for nausea or vomiting.   0 09/14/2014 at Unknown time  . Oxycodone HCl 10 MG TABS Take 1 tablet by mouth 2 (two) times daily as needed (pain).   0 09/14/2014 at Unknown time  . polyethylene glycol (MIRALAX / GLYCOLAX) packet Take 17 g by mouth daily as needed.   Past Week at Unknown time  . Potassium 95 MG TABS Take 1 tablet by mouth daily.   09/14/2014 at Unknown time  . sulfamethoxazole-trimethoprim (BACTRIM DS,SEPTRA DS) 800-160 MG per tablet Take 1 tablet by mouth 2 (two) times daily. Starting 09/12/2014 x 10 days.  0 09/14/2014 at Unknown time  . tamsulosin (FLOMAX) 0.4 MG CAPS capsule Take 0.4 mg by mouth daily.  11 09/14/2014 at Unknown time   Scheduled: . antiseptic oral rinse  7 mL Mouth Rinse BID  . Chlorhexidine Gluconate Cloth  6 each Topical Q0600  .  docusate sodium  100 mg Oral BID  . feeding supplement (ENSURE COMPLETE)  237 mL Oral BID BM  . gabapentin  300 mg Oral TID  . mupirocin ointment  1 application Nasal BID  . piperacillin-tazobactam (ZOSYN)  IV  3.375 g Intravenous 3 times per day  . sodium chloride  3 mL Intravenous Q12H  . tamsulosin  0.4 mg Oral Daily   Continuous: . sodium chloride 135 mL/hr at 09/18/14 0700   VOZ:[id number] **OR** acetaminophen, ALPRAZolam, alum & mag hydroxide-simeth, benzonatate, bisacodyl, LORazepam, ondansetron **OR** ondansetron (ZOFRAN) IV, oxyCODONE  Assesment: He was admitted with sepsis and acute renal failure. He has been acutely encephalopathic and that is better. He has Escherichia coli in his blood and in his urine. He has had multiple recurrent urinary tract infections. He had demand ischemia and elevated troponin levels. Principal Problem:   Acute renal failure Active Problems:   UTI (lower urinary tract infection)   Dehydration   Elevated troponin   Thrombocytopenia   Hyponatremia   Sepsis   Acute encephalopathy   NSTEMI (non-ST elevated myocardial infarction)    Plan: Continue with current treatments and medications    LOS: 3 days   Zuleyma Scharf L 09/18/2014, 9:16 AM

## 2014-09-19 DIAGNOSIS — R7989 Other specified abnormal findings of blood chemistry: Secondary | ICD-10-CM

## 2014-09-19 LAB — BASIC METABOLIC PANEL
ANION GAP: 11 (ref 5–15)
BUN: 30 mg/dL — ABNORMAL HIGH (ref 6–23)
CHLORIDE: 106 meq/L (ref 96–112)
CO2: 23 meq/L (ref 19–32)
Calcium: 9.9 mg/dL (ref 8.4–10.5)
Creatinine, Ser: 2.04 mg/dL — ABNORMAL HIGH (ref 0.50–1.35)
GFR calc non Af Amer: 32 mL/min — ABNORMAL LOW (ref >90)
GFR, EST AFRICAN AMERICAN: 37 mL/min — AB (ref >90)
Glucose, Bld: 101 mg/dL — ABNORMAL HIGH (ref 70–99)
POTASSIUM: 3.6 meq/L — AB (ref 3.7–5.3)
SODIUM: 140 meq/L (ref 137–147)

## 2014-09-19 LAB — CALCIUM, URINE, 24 HOUR
CALCIUM 24HR UR: 102 mg/d (ref 100–250)
Calcium, Ur: 3 mg/dL
Urine Total Volume-UCA24: 3400 mL

## 2014-09-19 LAB — PTH, INTACT AND CALCIUM
Calcium, Total (PTH): 9.7 mg/dL (ref 8.4–10.5)
PTH: 99 pg/mL — AB (ref 14–64)

## 2014-09-19 MED ORDER — CEFTRIAXONE SODIUM IN DEXTROSE 20 MG/ML IV SOLN
1.0000 g | INTRAVENOUS | Status: DC
Start: 2014-09-19 — End: 2014-09-20
  Administered 2014-09-19 – 2014-09-20 (×2): 1 g via INTRAVENOUS
  Filled 2014-09-19 (×3): qty 50

## 2014-09-19 NOTE — Progress Notes (Signed)
Subjective: Interval History: Patient offers o complaint. His appetite is getting better and no difficulty in breathing  Objective: Vital signs in last 24 hours: Temp:  [98.2 F (36.8 C)-100.4 F (38 C)] 98.2 F (36.8 C) (11/23 0752) Resp:  [14-34] 24 (11/23 0800) BP: (96-163)/(54-80) 150/67 mmHg (11/23 0800) SpO2:  [96 %-97 %] 97 % (11/22 2200) Weight:  [71 kg (156 lb 8.4 oz)] 71 kg (156 lb 8.4 oz) (11/23 0400) Weight change: 5.8 kg (12 lb 12.6 oz)  Intake/Output from previous day: 11/22 0701 - 11/23 0700 In: 3925.8 [P.O.:1800; I.V.:2025.8; IV Piggyback:100] Out: 3275 [Urine:3275] Intake/Output this shift:    General: Patient is more alert today and eating his breakfast Chest is clear,no rales or rhonchi Heart :RRR  Abdomen is soft and none tender and positive bowl sound Extremities no edema  Lab Results:  Recent Labs  09/18/14 0530  WBC 9.2  HGB 12.7*  HCT 36.7*  PLT 154   BMET:   Recent Labs  09/18/14 0530 09/19/14 0540  NA 144 140  K 3.8 3.6*  CL 108 106  CO2 23 23  GLUCOSE 100* 101*  BUN 39* 30*  CREATININE 2.75* 2.04*  CALCIUM 10.2 9.9   No results for input(s): PTH in the last 72 hours. Iron Studies: No results for input(s): IRON, TIBC, TRANSFERRIN, FERRITIN in the last 72 hours.  Studies/Results: No results found.  I have reviewed the patient's current medications.  Assessment/Plan: Problem #1 acute kidney injury: Patient BUN and creatinine is progressively declining. Patient remained none oliguric. Problem #2 hypercalcemia: Most likely from dehydration. His calcium is improving and presently in with in normal range . His 25, vitamin D level is low his PTH is still pending Problem #3 recurrent UTI : presently from [facility] . Patient is afebrile and normal white blood cell count Problem #4 thrombocytopenia Problem #5 right nephromegaly.  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

## 2014-09-19 NOTE — Progress Notes (Signed)
Physical Therapy Treatment Patient Details Name: Ivan [name] MRN: [id number] DOB: 03-14-1946 Today's Date: 09/19/2014    History of Present Illness Pt is a 68 year old male who was admitted for sepsis secondary to a UTI.  He also has had renal failure.  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    PT Comments    Pt has been transferred to [address].  He reports feeling weak but otherwise well.  He is essentially independent with transfers but gait is mildly unsteady using a walker.  He was able to ambulate 52' with a walker, cues for maintaining full thoracic extension and extension of knees in stance.  He was able to participate in some closed chain strengthening exercise following gait.  He is now up to the chair.  I still feel that he will be able to transition to home at d/c with HHPT.  Follow Up Recommendations  Home health PT     Equipment Recommendations  None recommended by PT    Recommendations for Other Services  none     Precautions / Restrictions Precautions Precautions: Fall Restrictions Weight Bearing Restrictions: No    Mobility  Bed Mobility Overal bed mobility: Modified Independent                Transfers Overall transfer level: Modified independent                  Ambulation/Gait Ambulation/Gait assistance: Supervision Ambulation Distance (Feet): 80 Feet Assistive device: Rolling walker (2 wheeled) (pt states that he prefers a standard walker) Gait Pattern/deviations: Trunk flexed;Narrow base of support   Gait velocity interpretation: Below normal speed for age/gender General Gait Details: pt has difficulty fully extending his knees in stance   Stairs            Wheelchair Mobility    Modified Rankin (Stroke Patients Only)       Balance Overall balance assessment: No apparent balance deficits (not formally assessed)                                   Cognition Arousal/Alertness: Awake/alert Behavior During Therapy: WFL for tasks assessed/performed Overall Cognitive Status: Within Functional Limits for tasks assessed                      Exercises General Exercises - Lower Extremity Hip Flexion/Marching: AROM;Both;10 reps;Standing Toe Raises: AROM;10 reps;Both;Standing Mini-Sqauts: AROM;Both;10 reps;Standing    General Comments        Pertinent Vitals/Pain Pain Assessment: No/denies pain    Home Living                      Prior Function            PT Goals (current goals can now be found in the care plan section) Progress towards PT goals: Progressing toward goals    Frequency   min of 3x/week    PT Plan Current plan remains appropriate    Co-evaluation             End of Session Equipment Utilized During Treatment: Gait belt Activity Tolerance: Patient tolerated treatment well Patient left: in chair;with family/visitor present (wife plans to be with husband continually)     Time: 1300-1330 PT Time Calculation (min) (ACUTE ONLY): 30 min  Charges:  $Gait Training: 23-37 mins  G CodesSable Feil October 04, 2014, 1:41 PM

## 2014-09-19 NOTE — Progress Notes (Signed)
Subjective: He says he feels better. He has no new complaints. His breathing is good. His cultures all showed Escherichia coli and sensitivities are out now.  Objective: Vital signs in last 24 hours: Temp:  [98.2 F (36.8 C)-100.4 F (38 C)] 98.2 F (36.8 C) (11/23 0752) Resp:  [14-34] 26 (11/23 0500) BP: (96-163)/(54-80) 149/72 mmHg (11/23 0500) SpO2:  [96 %-97 %] 97 % (11/22 2200) Weight:  [71 kg (156 lb 8.4 oz)] 71 kg (156 lb 8.4 oz) (11/23 0400) Weight change: 5.8 kg (12 lb 12.6 oz) Last BM Date: 09/18/14  Intake/Output from previous day: 11/22 0701 - 11/23 0700 In: 3925.8 [P.O.:1800; I.V.:2025.8; IV Piggyback:100] Out: 3275 [Urine:3275]  PHYSICAL EXAM General appearance: alert, cooperative and no distress Resp: clear to auscultation bilaterally Cardio: regular rate and rhythm, S1, S2 normal, no murmur, click, rub or gallop GI: soft, non-tender; bowel sounds normal; no masses,  no organomegaly Extremities: extremities normal, atraumatic, no cyanosis or edema  Lab Results:  Results for orders placed or performed during the hospital encounter of 09/15/14 (from the past 48 hour(s))  Ammonia     Status: None   Collection Time: 09/17/14 10:31 AM  Result Value Ref Range   Ammonia 16 11 - 60 umol/L  CBC with Differential     Status: Abnormal   Collection Time: 09/18/14  5:30 AM  Result Value Ref Range   WBC 9.2 4.0 - 10.5 K/uL   RBC 4.18 (L) 4.22 - 5.81 MIL/uL   Hemoglobin 12.7 (L) 13.0 - 17.0 g/dL   HCT 36.7 (L) 39.0 - 52.0 %   MCV 87.8 78.0 - 100.0 fL   MCH 30.4 26.0 - 34.0 pg   MCHC 34.6 30.0 - 36.0 g/dL   RDW 14.6 11.5 - 15.5 %   Platelets 154 150 - 400 K/uL   Neutrophils Relative % 83 (H) 43 - 77 %   Lymphocytes Relative 6 (L) 12 - 46 %   Monocytes Relative 10 3 - 12 %   Eosinophils Relative 1 0 - 5 %   Basophils Relative 0 0 - 1 %   Neutro Abs 7.6 1.7 - 7.7 K/uL   Lymphs Abs 0.6 (L) 0.7 - 4.0 K/uL   Monocytes Absolute 0.9 0.1 - 1.0 K/uL   Eosinophils Absolute  0.1 0.0 - 0.7 K/uL   Basophils Absolute 0.0 0.0 - 0.1 K/uL   WBC Morphology ATYPICAL LYMPHOCYTES    Smear Review LARGE PLATELETS PRESENT   Comprehensive metabolic panel     Status: Abnormal   Collection Time: 09/18/14  5:30 AM  Result Value Ref Range   Sodium 144 137 - 147 mEq/L    Comment: DELTA CHECK NOTED   Potassium 3.8 3.7 - 5.3 mEq/L   Chloride 108 96 - 112 mEq/L   CO2 23 19 - 32 mEq/L   Glucose, Bld 100 (H) 70 - 99 mg/dL   BUN 39 (H) 6 - 23 mg/dL   Creatinine, Ser 2.75 (H) 0.50 - 1.35 mg/dL   Calcium 10.2 8.4 - 10.5 mg/dL   Total Protein 6.0 6.0 - 8.3 g/dL   Albumin 2.3 (L) 3.5 - 5.2 g/dL   AST 17 0 - 37 U/L   ALT 17 0 - 53 U/L   Alkaline Phosphatase 114 39 - 117 U/L   Total Bilirubin 1.5 (H) 0.3 - 1.2 mg/dL   GFR calc non Af Amer 22 (L) >90 mL/min   GFR calc Af Amer 26 (L) >90 mL/min    Comment: (NOTE)  The eGFR has been calculated using the CKD EPI equation. This calculation has not been validated in all clinical situations. eGFR's persistently <90 mL/min signify possible Chronic Kidney Disease.    Anion gap 13 5 - 15  Basic metabolic panel     Status: Abnormal   Collection Time: 09/19/14  5:40 AM  Result Value Ref Range   Sodium 140 137 - 147 mEq/L   Potassium 3.6 (L) 3.7 - 5.3 mEq/L   Chloride 106 96 - 112 mEq/L   CO2 23 19 - 32 mEq/L   Glucose, Bld 101 (H) 70 - 99 mg/dL   BUN 30 (H) 6 - 23 mg/dL   Creatinine, Ser 2.04 (H) 0.50 - 1.35 mg/dL   Calcium 9.9 8.4 - 10.5 mg/dL   GFR calc non Af Amer 32 (L) >90 mL/min   GFR calc Af Amer 37 (L) >90 mL/min    Comment: (NOTE) The eGFR has been calculated using the CKD EPI equation. This calculation has not been validated in all clinical situations. eGFR's persistently <90 mL/min signify possible Chronic Kidney Disease.    Anion gap 11 5 - 15    ABGS No results for input(s): PHART, PO2ART, TCO2, HCO3 in the last 72 hours.  Invalid input(s): PCO2 CULTURES Recent Results (from the past 240 hour(s))  Blood  culture (routine x 2)     Status: None   Collection Time: 09/15/14  9:50 AM  Result Value Ref Range Status   Specimen Description BLOOD LEFT ANTECUBITAL  Final   Special Requests BOTTLES DRAWN AEROBIC AND ANAEROBIC 5CC  Final   Culture  Setup Time   Final    09/16/2014 14:04 Performed at [facility]   Final    ESCHERICHIA COLI Note: Gram Stain Report Called to,Read Back By and Verified With: KEITH A_0  ON 09/16/14 BY FORSYTH K Performed at [facility] Performed at [facility]    Report Status 09/18/2014 FINAL  Final   Organism ID, Bacteria ESCHERICHIA COLI  Final      Susceptibility   Escherichia coli - MIC*    AMPICILLIN >=32 RESISTANT Resistant     AMPICILLIN/SULBACTAM >=32 RESISTANT Resistant     CEFAZOLIN 8 SENSITIVE Sensitive     CEFEPIME <=1 SENSITIVE Sensitive     CEFTAZIDIME <=1 SENSITIVE Sensitive     CEFTRIAXONE <=1 SENSITIVE Sensitive     CIPROFLOXACIN >=4 RESISTANT Resistant     GENTAMICIN <=1 SENSITIVE Sensitive     IMIPENEM <=0.25 SENSITIVE Sensitive     PIP/TAZO <=4 SENSITIVE Sensitive     TOBRAMYCIN <=1 SENSITIVE Sensitive     TRIMETH/SULFA >=320 RESISTANT Resistant     * ESCHERICHIA COLI  Urine culture     Status: None   Collection Time: 09/15/14 10:02 AM  Result Value Ref Range Status   Specimen Description URINE, CATHETERIZED  Final   Special Requests NONE  Final   Culture  Setup Time   Final    09/15/2014 14:54 Performed at [facility]   Final    75,000 COLONIES/ML Performed at [facility]   Final    ESCHERICHIA COLI Performed at [facility]    Report Status 09/17/2014 FINAL  Final   Organism ID, Bacteria ESCHERICHIA COLI  Final      Susceptibility   Escherichia coli - MIC*    AMPICILLIN >=32 RESISTANT Resistant     CEFAZOLIN <=4 SENSITIVE Sensitive  CEFTRIAXONE <=1 SENSITIVE Sensitive     CIPROFLOXACIN >=4 RESISTANT Resistant     GENTAMICIN <=1  SENSITIVE Sensitive     LEVOFLOXACIN >=8 RESISTANT Resistant     NITROFURANTOIN <=16 SENSITIVE Sensitive     TOBRAMYCIN <=1 SENSITIVE Sensitive     TRIMETH/SULFA >=320 RESISTANT Resistant     PIP/TAZO <=4 SENSITIVE Sensitive     * ESCHERICHIA COLI  Blood culture (routine x 2)     Status: None   Collection Time: 09/15/14 10:57 AM  Result Value Ref Range Status   Specimen Description BLOOD LEFT ANTECUBITAL  Final   Special Requests BOTTLES DRAWN AEROBIC AND ANAEROBIC [facility]  Final   Culture  Setup Time   Final    09/16/2014 14:07 Performed at [facility]    Culture   Final    ESCHERICHIA COLI Note: SUSCEPTIBILITIES PERFORMED ON PREVIOUS CULTURE WITHIN THE LAST 5 DAYS. Note: Gram Stain Report Called to,Read Back By and Verified With: KEITH A_0  ON 09/16/14 BY FORSYTH K Performed at [facility] Performed at [facility]    Report Status 09/18/2014 FINAL  Final  MRSA PCR Screening     Status: Abnormal   Collection Time: 09/15/14 12:15 PM  Result Value Ref Range Status   MRSA by PCR POSITIVE (A) NEGATIVE Final    Comment:        The GeneXpert MRSA Assay (FDA approved for NASAL specimens only), is one component of a comprehensive MRSA colonization surveillance program. It is not intended to diagnose MRSA infection nor to guide or monitor treatment for MRSA infections. RESULT CALLED TO, READ BACK BY AND VERIFIED WITH: ROWE,N. AT 1408 ON 09/15/2014 BY BAUGHAM,M.    Studies/Results: No results found.  Medications:  Prior to Admission:  Prescriptions prior to admission  Medication Sig Dispense Refill Last Dose  . ALPRAZolam (XANAX) 1 MG tablet Take 1 mg by mouth at bedtime as needed for sleep.   5 09/14/2014 at Unknown time  . B Complex-C (B-COMPLEX WITH VITAMIN C) tablet Take 1 tablet by mouth daily.   09/14/2014 at Unknown time  . Multiple Vitamin (MULTIVITAMIN WITH MINERALS) TABS tablet Take 1 tablet by mouth daily.   09/14/2014 at Unknown time  .  ondansetron (ZOFRAN-ODT) 4 MG disintegrating tablet Take 1 tablet by mouth every 8 (eight) hours as needed for nausea or vomiting.   0 09/14/2014 at Unknown time  . Oxycodone HCl 10 MG TABS Take 1 tablet by mouth 2 (two) times daily as needed (pain).   0 09/14/2014 at Unknown time  . polyethylene glycol (MIRALAX / GLYCOLAX) packet Take 17 g by mouth daily as needed.   Past Week at Unknown time  . Potassium 95 MG TABS Take 1 tablet by mouth daily.   09/14/2014 at Unknown time  . sulfamethoxazole-trimethoprim (BACTRIM DS,SEPTRA DS) 800-160 MG per tablet Take 1 tablet by mouth 2 (two) times daily. Starting 09/12/2014 x 10 days.  0 09/14/2014 at Unknown time  . tamsulosin (FLOMAX) 0.4 MG CAPS capsule Take 0.4 mg by mouth daily.  11 09/14/2014 at Unknown time   Scheduled: . antiseptic oral rinse  7 mL Mouth Rinse BID  . Chlorhexidine Gluconate Cloth  6 each Topical Q0600  . docusate sodium  100 mg Oral BID  . feeding supplement (ENSURE COMPLETE)  237 mL Oral BID BM  . gabapentin  300 mg Oral TID  . mupirocin ointment  1 application Nasal BID  . nicotine  21 mg Transdermal Daily  .  piperacillin-tazobactam (ZOSYN)  IV  3.375 g Intravenous 3 times per day  . sodium chloride  3 mL Intravenous Q12H  . tamsulosin  0.4 mg Oral Daily   Continuous: . sodium chloride 135 mL/hr at 09/19/14 0500   SNK:[id number] **OR** acetaminophen, ALPRAZolam, alum & mag hydroxide-simeth, benzonatate, bisacodyl, LORazepam, ondansetron **OR** ondansetron (ZOFRAN) IV, oxyCODONE  Assesment: He was admitted with sepsis which is from a UTI. He had positive blood cultures for Escherichia coli. His antibiotics will be changed now that sensitivities are back. He is much improved. His blood pressure is okay.  He was dehydrated which is better.  He had non-STEMI but no chest pain now or during the event He had acute on chronic renal failure and that is improved.  He has thrombocytopenia unchanged  He had acute  encephalopathy which was metabolic from his sepsis and that is much better  Principal Problem:   Acute renal failure Active Problems:   UTI (lower urinary tract infection)   Dehydration   Elevated troponin   Thrombocytopenia   Hyponatremia   Sepsis   Acute encephalopathy   NSTEMI (non-ST elevated myocardial infarction)    Plan:Transfer from ICU. Narrowing his antibiotic coverage. Continue other treatments. Probably reduces/discontinue his gabapentin tomorrow depending on his mental status. Telephone consultation with infectious disease about whether he should be on long-term antibiotics   LOS: 4 days   Ladashia Demarinis L 09/19/2014, 8:15 AM

## 2014-09-19 NOTE — Progress Notes (Signed)
Patient ID: Ivan [name], male   DOB: 11-08-1945, 68 y.o.   MRN: [id number]  Patient seen by cardiology Dr Bronson Ing on Friday, admitted with urosepsis and demand ischemia. Recent negative stress test 08/2013, repeat echo this admission shows hyerdynamic LV with LVEF 65-70% consistent with sepsis, and no wall motion abnormalities. No indications for further cardiac testing or interventions, defer management for urosepsis to primary team. Will sign off of inpatient care.   Zandra Abts MD

## 2014-09-19 NOTE — Progress Notes (Signed)
Report called to M. Wright,RN. Patient transferred to 304 in stable condition via bed with RN.

## 2014-09-19 NOTE — Care Management Utilization Note (Signed)
UR review complete.  

## 2014-09-20 ENCOUNTER — Encounter: Payer: Self-pay | Admitting: Adult Health

## 2014-09-20 LAB — BASIC METABOLIC PANEL
ANION GAP: 11 (ref 5–15)
BUN: 23 mg/dL (ref 6–23)
CALCIUM: 10.1 mg/dL (ref 8.4–10.5)
CHLORIDE: 109 meq/L (ref 96–112)
CO2: 23 mEq/L (ref 19–32)
CREATININE: 1.64 mg/dL — AB (ref 0.50–1.35)
GFR calc Af Amer: 49 mL/min — ABNORMAL LOW (ref >90)
GFR calc non Af Amer: 42 mL/min — ABNORMAL LOW (ref >90)
GLUCOSE: 86 mg/dL (ref 70–99)
Potassium: 3.8 mEq/L (ref 3.7–5.3)
Sodium: 143 mEq/L (ref 137–147)

## 2014-09-20 LAB — VITAMIN D 1,25 DIHYDROXY
Vitamin D 1, 25 (OH)2 Total: 23 pg/mL (ref 18–72)
Vitamin D2 1, 25 (OH)2: <8 pg/mL
Vitamin D3 1, 25 (OH)2: 23 pg/mL

## 2014-09-20 MED ORDER — CEFUROXIME AXETIL 500 MG PO TABS
500.0000 mg | ORAL_TABLET | Freq: Two times a day (BID) | ORAL | Status: DC
Start: 2014-09-20 — End: 2016-07-15

## 2014-09-20 MED ORDER — NITROFURANTOIN MONOHYD MACRO 100 MG PO CAPS: 100.0000 mg | ORAL_CAPSULE | Freq: Two times a day (BID) | ORAL | Status: AC

## 2014-09-20 NOTE — Discharge Summary (Signed)
Physician Discharge Summary  Patient ID: Ivan [name] MRN: [id number] DOB/AGE: [birth year] 68 y.o. Primary Care Physician:Lota Leamer L, MD Admit date: 09/15/2014 Discharge date: 09/20/2014    Discharge Diagnoses:   Principal Problem:   Acute renal failure Active Problems:   UTI (lower urinary tract infection)   Dehydration   Elevated troponin   Thrombocytopenia   Hyponatremia   Sepsis   Acute encephalopathy   NSTEMI (non-ST elevated myocardial infarction)     Medication List    STOP taking these medications        sulfamethoxazole-trimethoprim 800-160 MG per tablet  Commonly known as:  BACTRIM DS,SEPTRA DS      TAKE these medications        ALPRAZolam 1 MG tablet  Commonly known as:  XANAX  Take 1 mg by mouth at bedtime as needed for sleep.     B-complex with vitamin C tablet  Take 1 tablet by mouth daily.     cefUROXime 500 MG tablet  Commonly known as:  CEFTIN  Take 1 tablet (500 mg total) by mouth 2 (two) times daily with a meal.     multivitamin with minerals Tabs tablet  Take 1 tablet by mouth daily.     nitrofurantoin (macrocrystal-monohydrate) 100 MG capsule  Commonly known as:  MACROBID  Take 1 capsule (100 mg total) by mouth 2 (two) times daily.     ondansetron 4 MG disintegrating tablet  Commonly known as:  ZOFRAN-ODT  Take 1 tablet by mouth every 8 (eight) hours as needed for nausea or vomiting.     Oxycodone HCl 10 MG Tabs  Take 1 tablet by mouth 2 (two) times daily as needed (pain).     polyethylene glycol packet  Commonly known as:  MIRALAX / GLYCOLAX  Take 17 g by mouth daily as needed.     Potassium 95 MG Tabs  Take 1 tablet by mouth daily.     tamsulosin 0.4 MG Caps capsule  Commonly known as:  FLOMAX  Take 0.4 mg by mouth daily.        Discharged Condition: Improved    Consults: Nephrology/cardiology  Significant Diagnostic Studies: US Renal  09/15/2014   CLINICAL DATA:  Recurrent urinary tract infections   EXAM: RENAL/URINARY TRACT ULTRASOUND COMPLETE  COMPARISON:  None.  FINDINGS: Right Kidney:  Length: 15.6 cm. Echogenicity within normal limits. No mass or hydronephrosis visualized.  Left Kidney:  Length: Surgically absent.  Bladder:  Appears normal for degree of bladder distention.  Other:  Gallstones and gallbladder sludge noted.  IMPRESSION: 1. Normal appearance of the right kidney. 2. Status post left nephrectomy.   Electronically Signed   By: Kerby Moors M.D.   On: 09/15/2014 14:10   Dg Chest Port 1 View  09/15/2014   CLINICAL DATA:  Urinary tract infection  EXAM: PORTABLE CHEST - 1 VIEW  COMPARISON:  None.  FINDINGS: Normal cardiothymic silhouette. Mild central venous pulmonary congestion. No effusion, infiltrate, or pneumothorax.  IMPRESSION: Central venous pulmonary congestion.  No focal infiltrate.   Electronically Signed   By: Suzy Bouchard M.D.   On: 09/15/2014 09:32    Lab Results: Basic Metabolic Panel:  Recent Labs  09/19/14 0540 09/20/14 0530  NA 140 143  K 3.6* 3.8  CL 106 109  CO2 23 23  GLUCOSE 101* 86  BUN 30* 23  CREATININE 2.04* 1.64*  CALCIUM 9.9 10.1   Liver Function Tests:  Recent Labs  09/18/14 0530  AST 17  ALT 17  ALKPHOS  114  BILITOT 1.5*  PROT 6.0  ALBUMIN 2.3*     CBC:  Recent Labs  09/18/14 0530  WBC 9.2  NEUTROABS 7.6  HGB 12.7*  HCT 36.7*  MCV 87.8  PLT 154    Recent Results (from the past 240 hour(s))  Blood culture (routine x 2)     Status: None   Collection Time: 09/15/14  9:50 AM  Result Value Ref Range Status   Specimen Description BLOOD LEFT ANTECUBITAL  Final   Special Requests BOTTLES DRAWN AEROBIC AND ANAEROBIC 5CC  Final   Culture  Setup Time   Final    09/16/2014 14:04 Performed at [place]   Final    ESCHERICHIA COLI Note: Gram Stain Report Called to,Read Back By and Verified With: KEITH A@0438  ON 09/16/14 BY FORSYTH K Performed at [facility] Performed at [facility]    Report Status 09/18/2014 FINAL  Final   Organism ID, Bacteria ESCHERICHIA COLI  Final      Susceptibility   Escherichia coli - MIC*    AMPICILLIN >=32 RESISTANT Resistant     AMPICILLIN/SULBACTAM >=32 RESISTANT Resistant     CEFAZOLIN 8 SENSITIVE Sensitive     CEFEPIME <=1 SENSITIVE Sensitive     CEFTAZIDIME <=1 SENSITIVE Sensitive     CEFTRIAXONE <=1 SENSITIVE Sensitive     CIPROFLOXACIN >=4 RESISTANT Resistant     GENTAMICIN <=1 SENSITIVE Sensitive     IMIPENEM <=0.25 SENSITIVE Sensitive     PIP/TAZO <=4 SENSITIVE Sensitive     TOBRAMYCIN <=1 SENSITIVE Sensitive     TRIMETH/SULFA >=320 RESISTANT Resistant     * ESCHERICHIA COLI  Urine culture     Status: None   Collection Time: 09/15/14 10:02 AM  Result Value Ref Range Status   Specimen Description URINE, CATHETERIZED  Final   Special Requests NONE  Final   Culture  Setup Time   Final    09/15/2014 14:54 Performed at [place]   Final    75,000 COLONIES/ML Performed at [facility]    Culture   Final    ESCHERICHIA COLI Performed at [facility]    Report Status 09/17/2014 FINAL  Final   Organism ID, Bacteria ESCHERICHIA COLI  Final      Susceptibility   Escherichia coli - MIC*    AMPICILLIN >=32 RESISTANT Resistant     CEFAZOLIN <=4 SENSITIVE Sensitive     CEFTRIAXONE <=1 SENSITIVE Sensitive     CIPROFLOXACIN >=4 RESISTANT Resistant     GENTAMICIN <=1 SENSITIVE Sensitive     LEVOFLOXACIN >=8 RESISTANT Resistant     NITROFURANTOIN <=16 SENSITIVE Sensitive     TOBRAMYCIN <=1 SENSITIVE Sensitive     TRIMETH/SULFA >=320 RESISTANT Resistant     PIP/TAZO <=4 SENSITIVE Sensitive     * ESCHERICHIA COLI  Blood culture (routine x 2)     Status: None   Collection Time: 09/15/14 10:57 AM  Result Value Ref Range Status   Specimen Description BLOOD LEFT ANTECUBITAL  Final   Special Requests BOTTLES DRAWN AEROBIC AND ANAEROBIC [facility]  Final   Culture  Setup Time   Final     09/16/2014 14:07 Performed at [facility]    Culture   Final    ESCHERICHIA COLI Note: SUSCEPTIBILITIES PERFORMED ON PREVIOUS CULTURE WITHIN THE LAST 5 DAYS. Note: Gram Stain Report Called to,Read Back By and Verified With: KEITH A@0438  ON 09/16/14 BY  [place] K Performed at [facility] Performed at [facility]    Report Status 09/18/2014 FINAL  Final  MRSA PCR Screening     Status: Abnormal   Collection Time: 09/15/14 12:15 PM  Result Value Ref Range Status   MRSA by PCR POSITIVE (A) NEGATIVE Final    Comment:        The GeneXpert MRSA Assay (FDA approved for NASAL specimens only), is one component of a comprehensive MRSA colonization surveillance program. It is not intended to diagnose MRSA infection nor to guide or monitor treatment for MRSA infections. RESULT CALLED TO, READ BACK BY AND VERIFIED WITH: ROWE,N. AT 1408 ON 09/15/2014 BY BAUGHAM,M.      Hospital Course: This is a 68 year old who has multiple medical problems including COPD, chronic back pain and recurrent urinary tract infections. He had nephrectomy done about 6 months ago because of the recurrent urinary tract infections but it has not stopped him from having this again. He got sick about 4 days prior to admission became more and more confused. He refused to come to the emergency department but finally became confused enough that his wife simply brought him. When he was seen in the emergency department he was noted to have acute renal failure he was septic and had what appeared to be a urinary tract infection and was confused. He was treated with antibiotics and had Escherichia coli in his urine and his blood. He had encephalopathy that was metabolic and this was treated and improved. He gradually got better his renal function returned to baseline he had no chest pain but did have what appeared to be a non-STEMI probably is a demand issue. By the time of discharge she was awake and alert and  able to ambulate with PT assistance. I have discussed his situation with infectious disease by telephone and they said that since she's had some any urinary tract infection that would be reasonable to put him on chronic suppressive therapy.  Discharge Exam: Blood pressure 152/77, pulse 77, temperature 98.5 F (36.9 C), temperature source Oral, resp. rate 20, height 5\' 10"  (1.778 m), weight 72.122 kg (159 lb), SpO2 99 %. He is awake and alert. His chest is clear. His heart is regular.  Disposition: Home with home health PT. He will follow in my office and I will make him an appointment to see the cardiologist to be sure that he doesn't need any other ischemic testing.      Discharge Instructions    Face-to-face encounter (required for Medicare/Medicaid patients)    Complete by:  As directed   I Deonta Bomberger L certify that this patient is under my care and that I, or a nurse practitioner or physician's assistant working with me, had a face-to-face encounter that meets the physician face-to-face encounter requirements with this patient on 09/20/2014. The encounter with the patient was in whole, or in part for the following medical condition(s) which is the primary reason for home health care (List medical condition): Sepsis/UTI  The encounter with the patient was in whole, or in part, for the following medical condition, which is the primary reason for home health care:  Sepsis/UTI  I certify that, based on my findings, the following services are medically necessary home health services:  Physical therapy  Reason for Medically Necessary Home Health Services:  Skilled Nursing- Change/Decline in Patient Status  My clinical findings support the need for the above services:  Unsafe ambulation due to balance issues  Further, I certify  that my clinical findings support that this patient is homebound due to:  Ambulates short distances less than 300 feet     Home Health    Complete by:  As directed   To  provide the following care/treatments:  PT             Signed: Celestia Duva L   09/20/2014, 8:49 AM

## 2014-09-20 NOTE — Progress Notes (Signed)
Ivan [name]  MRN: [id number]  DOB/AGE: 1946/08/19 68 y.o.  Primary Care Physician:HAWKINS,EDWARD L, MD  Admit date: 09/15/2014  Chief Complaint:  Chief Complaint  Patient presents with  . Altered Mental Status  . Urinary Tract Infection    S-Pt presented on  09/15/2014 with  Chief Complaint  Patient presents with  . Altered Mental Status  . Urinary Tract Infection  .    Pt today feels better  Meds . antiseptic oral rinse  7 mL Mouth Rinse BID  . cefTRIAXone (ROCEPHIN)  IV  1 g Intravenous Q24H  . docusate sodium  100 mg Oral BID  . feeding supplement (ENSURE COMPLETE)  237 mL Oral BID BM  . gabapentin  300 mg Oral TID  . nicotine  21 mg Transdermal Daily  . sodium chloride  3 mL Intravenous Q12H  . tamsulosin  0.4 mg Oral Daily       Physical Exam: Vital signs in last 24 hours: Temp:  [97.4 F (36.3 C)-98.8 F (37.1 C)] 98.5 F (36.9 C) (11/24 7829) Pulse Rate:  [69-84] 77 (11/24 0652) Resp:  [20] 20 (11/24 0652) BP: (133-152)/(69-77) 152/77 mmHg (11/24 0652) SpO2:  [96 %-100 %] 99 % (11/24 0652) Weight change: 2 lb 7.6 oz (1.122 kg) Last BM Date: 09/19/14  Intake/Output from previous day: 11/23 0701 - 11/24 0700 In: 3581.5 [P.O.:480; I.V.:3051.5; IV Piggyback:50] Out: 2251 [Urine:2250; Stool:1]     Physical Exam: General- pt is awake,alert, oriented to time place and person Resp- No acute REsp distress, CTA B/L NO Rhonchi CVS- S1S2 regular in rate and rhythm GIT- BS+, soft, NT, ND EXT- NO LE Edema, Cyanosis   Lab Results: CBC  Recent Labs  09/18/14 0530  WBC 9.2  HGB 12.7*  HCT 36.7*  PLT 154    BMET  Recent Labs  09/19/14 0540 09/20/14 0530  NA 140 143  K 3.6* 3.8  CL 106 109  CO2 23 23  GLUCOSE 101* 86  BUN 30* 23  CREATININE 2.04* 1.64*  CALCIUM 9.9 10.1   Creat trend 2015 4.63=>2.04=>1.64 MICRO Recent Results (from the past 240 hour(s))  Blood culture (routine x 2)     Status: None   Collection Time: 09/15/14   9:50 AM  Result Value Ref Range Status   Specimen Description BLOOD LEFT ANTECUBITAL  Final   Special Requests BOTTLES DRAWN AEROBIC AND ANAEROBIC 5CC  Final   Culture  Setup Time   Final    09/16/2014 14:04 Performed at [place]   Final    ESCHERICHIA COLI Note: Gram Stain Report Called to,Read Back By and Verified With: KEITH A@0438  ON 09/16/14 BY FORSYTH K Performed at [facility] Performed at [facility]    Report Status 09/18/2014 FINAL  Final   Organism ID, Bacteria ESCHERICHIA COLI  Final      Susceptibility   Escherichia coli - MIC*    AMPICILLIN >=32 RESISTANT Resistant     AMPICILLIN/SULBACTAM >=32 RESISTANT Resistant     CEFAZOLIN 8 SENSITIVE Sensitive     CEFEPIME <=1 SENSITIVE Sensitive     CEFTAZIDIME <=1 SENSITIVE Sensitive     CEFTRIAXONE <=1 SENSITIVE Sensitive     CIPROFLOXACIN >=4 RESISTANT Resistant     GENTAMICIN <=1 SENSITIVE Sensitive     IMIPENEM <=0.25 SENSITIVE Sensitive     PIP/TAZO <=4 SENSITIVE Sensitive     TOBRAMYCIN <=1 SENSITIVE Sensitive     TRIMETH/SULFA >=320 RESISTANT Resistant     *  ESCHERICHIA COLI  Urine culture     Status: None   Collection Time: 09/15/14 10:02 AM  Result Value Ref Range Status   Specimen Description URINE, CATHETERIZED  Final   Special Requests NONE  Final   Culture  Setup Time   Final    09/15/2014 14:54 Performed at [place]   Final    75,000 COLONIES/ML Performed at [place]   Final    ESCHERICHIA COLI Performed at [facility]    Report Status 09/17/2014 FINAL  Final   Organism ID, Bacteria ESCHERICHIA COLI  Final      Susceptibility   Escherichia coli - MIC*    AMPICILLIN >=32 RESISTANT Resistant     CEFAZOLIN <=4 SENSITIVE Sensitive     CEFTRIAXONE <=1 SENSITIVE Sensitive     CIPROFLOXACIN >=4 RESISTANT Resistant     GENTAMICIN <=1 SENSITIVE Sensitive     LEVOFLOXACIN >=8 RESISTANT Resistant      NITROFURANTOIN <=16 SENSITIVE Sensitive     TOBRAMYCIN <=1 SENSITIVE Sensitive     TRIMETH/SULFA >=320 RESISTANT Resistant     PIP/TAZO <=4 SENSITIVE Sensitive     * ESCHERICHIA COLI  Blood culture (routine x 2)     Status: None   Collection Time: 09/15/14 10:57 AM  Result Value Ref Range Status   Specimen Description BLOOD LEFT ANTECUBITAL  Final   Special Requests BOTTLES DRAWN AEROBIC AND ANAEROBIC [facility]  Final   Culture  Setup Time   Final    09/16/2014 14:07 Performed at [facility]    Culture   Final    ESCHERICHIA COLI Note: SUSCEPTIBILITIES PERFORMED ON PREVIOUS CULTURE WITHIN THE LAST 5 DAYS. Note: Gram Stain Report Called to,Read Back By and Verified With: KEITH A@0438  ON 09/16/14 BY FORSYTH K Performed at [facility] Performed at [facility]    Report Status 09/18/2014 FINAL  Final  MRSA PCR Screening     Status: Abnormal   Collection Time: 09/15/14 12:15 PM  Result Value Ref Range Status   MRSA by PCR POSITIVE (A) NEGATIVE Final    Comment:        The GeneXpert MRSA Assay (FDA approved for NASAL specimens only), is one component of a comprehensive MRSA colonization surveillance program. It is not intended to diagnose MRSA infection nor to guide or monitor treatment for MRSA infections. RESULT CALLED TO, READ BACK BY AND VERIFIED WITH: ROWE,N. AT 1408 ON 09/15/2014 BY BAUGHAM,M.       Lab Results  Component Value Date   PTH 99* 09/16/2014   CALCIUM 10.1 09/20/2014   PHOS 3.1 09/16/2014               Impression: 1)Renal  AKI secondary to Prerena/ATN/Post renal               AKI improving               Creat better                AKI on CKD               CKD stage 3.               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 Vitamin 25-OH low.  5)ID-admitted with UTI On IV ABx Primary MD following  6)Electrolytes  Normokalemic NOrmonatremic   7)Acid base Co2 at goal   8) Hypercalcemia Sec to Hypovolemia  now better  Plan:  Will continue current tx      BHUTANI,MANPREET S 09/20/2014, 9:19 AM

## 2014-09-20 NOTE — Care Management Utilization Note (Signed)
UR review complete.  

## 2014-09-20 NOTE — Progress Notes (Signed)
Subjective: He says he feels well and wants to go home. He has no new complaints. He's been able to walk in the halls. He is not having any confusion  Objective: Vital signs in last 24 hours: Temp:  [97.4 F (36.3 C)-98.8 F (37.1 C)] 98.5 F (36.9 C) (11/24 0652) Pulse Rate:  [69-84] 77 (11/24 0652) Resp:  [20] 20 (11/24 0652) BP: (129-152)/(69-77) 152/77 mmHg (11/24 0652) SpO2:  [96 %-100 %] 99 % (11/24 0652) Weight:  [72.122 kg (159 lb)] 72.122 kg (159 lb) (11/23 0900) Weight change: 1.122 kg (2 lb 7.6 oz) Last BM Date: 09/19/14  Intake/Output from previous day: 11/23 0701 - 11/24 0700 In: 3581.5 [P.O.:480; I.V.:3051.5; IV Piggyback:50] Out: 2251 [Urine:2250; Stool:1]  PHYSICAL EXAM General appearance: alert, cooperative and no distress Resp: clear to auscultation bilaterally Cardio: regular rate and rhythm, S1, S2 normal, no murmur, click, rub or gallop GI: soft, non-tender; bowel sounds normal; no masses,  no organomegaly Extremities: extremities normal, atraumatic, no cyanosis or edema  Lab Results:  Results for orders placed or performed during the hospital encounter of 09/15/14 (from the past 48 hour(s))  Basic metabolic panel     Status: Abnormal   Collection Time: 09/19/14  5:40 AM  Result Value Ref Range   Sodium 140 137 - 147 mEq/L   Potassium 3.6 (L) 3.7 - 5.3 mEq/L   Chloride 106 96 - 112 mEq/L   CO2 23 19 - 32 mEq/L   Glucose, Bld 101 (H) 70 - 99 mg/dL   BUN 30 (H) 6 - 23 mg/dL   Creatinine, Ser 2.04 (H) 0.50 - 1.35 mg/dL   Calcium 9.9 8.4 - 10.5 mg/dL   GFR calc non Af Amer 32 (L) >90 mL/min   GFR calc Af Amer 37 (L) >90 mL/min    Comment: (NOTE) The eGFR has been calculated using the CKD EPI equation. This calculation has not been validated in all clinical situations. eGFR's persistently <90 mL/min signify possible Chronic Kidney Disease.    Anion gap 11 5 - 15  Calcium, urine, 24 hour     Status: None   Collection Time: 09/19/14 10:45 AM  Result  Value Ref Range   Urine Total Volume-UCA24 3400 mL   Calcium, Ur 3 mg/dL   Calcium, 24 hour urine 102 100 - 250 mg/day    Comment: (NOTE) Reference Range:         Females:                      < 250 mg/24hrs         Low Calcium diet              < 200 mg/24hrs The reference range for a low calcium diet assumes a daily intake of no more than 450 mg Calcium. A high calcium diet is defined as an intake of up to 1075m Calcium per day. Additionally, 4 mg/Kg/24 hrs is considered an upper normal limit for additional excretion normalized for body weight. [facility]     Status: Abnormal   Collection Time: 09/20/14  5:30 AM  Result Value Ref Range   Sodium 143 137 - 147 mEq/L   Potassium 3.8 3.7 - 5.3 mEq/L   Chloride 109 96 - 112 mEq/L   CO2 23 19 - 32 mEq/L   Glucose, Bld 86 70 - 99 mg/dL   BUN 23 6 - 23 mg/dL   Creatinine, Ser  1.64 (H) 0.50 - 1.35 mg/dL   Calcium 10.1 8.4 - 10.5 mg/dL   GFR calc non Af Amer 42 (L) >90 mL/min   GFR calc Af Amer 49 (L) >90 mL/min    Comment: (NOTE) The eGFR has been calculated using the CKD EPI equation. This calculation has not been validated in all clinical situations. eGFR's persistently <90 mL/min signify possible Chronic Kidney Disease.    Anion gap 11 5 - 15    ABGS No results for input(s): PHART, PO2ART, TCO2, HCO3 in the last 72 hours.  Invalid input(s): PCO2 CULTURES Recent Results (from the past 240 hour(s))  Blood culture (routine x 2)     Status: None   Collection Time: 09/15/14  9:50 AM  Result Value Ref Range Status   Specimen Description BLOOD LEFT ANTECUBITAL  Final   Special Requests BOTTLES DRAWN AEROBIC AND ANAEROBIC 5CC  Final   Culture  Setup Time   Final    09/16/2014 14:04 Performed at [place]   Final    ESCHERICHIA COLI Note: Gram Stain Report Called to,Read Back By and Verified With: KEITH A'@0438'  ON 09/16/14 BY FORSYTH K Performed at [facility] Performed at [facility]    Report Status 09/18/2014 FINAL  Final   Organism ID, Bacteria ESCHERICHIA COLI  Final      Susceptibility   Escherichia coli - MIC*    AMPICILLIN >=32 RESISTANT Resistant     AMPICILLIN/SULBACTAM >=32 RESISTANT Resistant     CEFAZOLIN 8 SENSITIVE Sensitive     CEFEPIME <=1 SENSITIVE Sensitive     CEFTAZIDIME <=1 SENSITIVE Sensitive     CEFTRIAXONE <=1 SENSITIVE Sensitive     CIPROFLOXACIN >=4 RESISTANT Resistant     GENTAMICIN <=1 SENSITIVE Sensitive     IMIPENEM <=0.25 SENSITIVE Sensitive     PIP/TAZO <=4 SENSITIVE Sensitive     TOBRAMYCIN <=1 SENSITIVE Sensitive     TRIMETH/SULFA >=320 RESISTANT Resistant     * ESCHERICHIA COLI  Urine culture     Status: None   Collection Time: 09/15/14 10:02 AM  Result Value Ref Range Status   Specimen Description URINE, CATHETERIZED  Final   Special Requests NONE  Final   Culture  Setup Time   Final    09/15/2014 14:54 Performed at [place]   Final    75,000 COLONIES/ML Performed at [facility]    Culture   Final    ESCHERICHIA COLI Performed at [facility]    Report Status 09/17/2014 FINAL  Final   Organism ID, Bacteria ESCHERICHIA COLI  Final      Susceptibility   Escherichia coli - MIC*    AMPICILLIN >=32 RESISTANT Resistant     CEFAZOLIN <=4 SENSITIVE Sensitive     CEFTRIAXONE <=1 SENSITIVE Sensitive     CIPROFLOXACIN >=4 RESISTANT Resistant     GENTAMICIN <=1 SENSITIVE Sensitive     LEVOFLOXACIN >=8 RESISTANT Resistant     NITROFURANTOIN <=16 SENSITIVE Sensitive     TOBRAMYCIN <=1 SENSITIVE Sensitive     TRIMETH/SULFA >=320 RESISTANT Resistant     PIP/TAZO <=4 SENSITIVE Sensitive     * ESCHERICHIA COLI  Blood culture (routine x 2)     Status: None   Collection Time: 09/15/14 10:57 AM  Result Value Ref Range Status   Specimen Description BLOOD LEFT ANTECUBITAL  Final   Special Requests BOTTLES DRAWN AEROBIC AND ANAEROBIC [place]   Final  Culture  Setup Time   Final    09/16/2014 14:07 Performed at [facility]    Culture   Final    ESCHERICHIA COLI Note: SUSCEPTIBILITIES PERFORMED ON PREVIOUS CULTURE WITHIN THE LAST 5 DAYS. Note: Gram Stain Report Called to,Read Back By and Verified With: KEITH A'@0438'  ON 09/16/14 BY FORSYTH K Performed at [facility] Performed at [facility]    Report Status 09/18/2014 FINAL  Final  MRSA PCR Screening     Status: Abnormal   Collection Time: 09/15/14 12:15 PM  Result Value Ref Range Status   MRSA by PCR POSITIVE (A) NEGATIVE Final    Comment:        The GeneXpert MRSA Assay (FDA approved for NASAL specimens only), is one component of a comprehensive MRSA colonization surveillance program. It is not intended to diagnose MRSA infection nor to guide or monitor treatment for MRSA infections. RESULT CALLED TO, READ BACK BY AND VERIFIED WITH: ROWE,N. AT 1408 ON 09/15/2014 BY BAUGHAM,M.    Studies/Results: No results found.  Medications:  Prior to Admission:  Prescriptions prior to admission  Medication Sig Dispense Refill Last Dose  . ALPRAZolam (XANAX) 1 MG tablet Take 1 mg by mouth at bedtime as needed for sleep.   5 09/14/2014 at Unknown time  . B Complex-C (B-COMPLEX WITH VITAMIN C) tablet Take 1 tablet by mouth daily.   09/14/2014 at Unknown time  . Multiple Vitamin (MULTIVITAMIN WITH MINERALS) TABS tablet Take 1 tablet by mouth daily.   09/14/2014 at Unknown time  . ondansetron (ZOFRAN-ODT) 4 MG disintegrating tablet Take 1 tablet by mouth every 8 (eight) hours as needed for nausea or vomiting.   0 09/14/2014 at Unknown time  . Oxycodone HCl 10 MG TABS Take 1 tablet by mouth 2 (two) times daily as needed (pain).   0 09/14/2014 at Unknown time  . polyethylene glycol (MIRALAX / GLYCOLAX) packet Take 17 g by mouth daily as needed.   Past Week at Unknown time  . Potassium 95 MG TABS Take 1 tablet by mouth daily.   09/14/2014 at Unknown time  .  sulfamethoxazole-trimethoprim (BACTRIM DS,SEPTRA DS) 800-160 MG per tablet Take 1 tablet by mouth 2 (two) times daily. Starting 09/12/2014 x 10 days.  0 09/14/2014 at Unknown time  . tamsulosin (FLOMAX) 0.4 MG CAPS capsule Take 0.4 mg by mouth daily.  11 09/14/2014 at Unknown time   Scheduled: . antiseptic oral rinse  7 mL Mouth Rinse BID  . cefTRIAXone (ROCEPHIN)  IV  1 g Intravenous Q24H  . docusate sodium  100 mg Oral BID  . feeding supplement (ENSURE COMPLETE)  237 mL Oral BID BM  . gabapentin  300 mg Oral TID  . nicotine  21 mg Transdermal Daily  . sodium chloride  3 mL Intravenous Q12H  . tamsulosin  0.4 mg Oral Daily   Continuous: . sodium chloride 75 mL/hr at 09/19/14 2237   QBH:ALPFXTKWIOXBD **OR** acetaminophen, ALPRAZolam, alum & mag hydroxide-simeth, benzonatate, bisacodyl, LORazepam, ondansetron **OR** ondansetron (ZOFRAN) IV, oxyCODONE  Assesment: He was admitted with urinary tract infection with sepsis and positive blood cultures. He had acute renal failure dehydration non-STEMI and encephalopathy all of which have improved. I think he is at maximum hospital benefit. Because he has had multiple urinary tract infections after discussion with infectious disease specialists I will plan for him to be on chronic suppressive therapy. Principal Problem:   Acute renal failure Active Problems:   UTI (lower urinary tract infection)  Dehydration   Elevated troponin   Thrombocytopenia   Hyponatremia   Sepsis   Acute encephalopathy   NSTEMI (non-ST elevated myocardial infarction)    Plan: Discharge home    LOS: 5 days   Cru Kritikos L 09/20/2014, 8:43 AM

## 2014-12-09 DIAGNOSIS — J449 Chronic obstructive pulmonary disease, unspecified: Secondary | ICD-10-CM | POA: Diagnosis not present

## 2014-12-09 DIAGNOSIS — M159 Polyosteoarthritis, unspecified: Secondary | ICD-10-CM | POA: Diagnosis not present

## 2014-12-09 DIAGNOSIS — M545 Low back pain: Secondary | ICD-10-CM | POA: Diagnosis not present

## 2014-12-09 DIAGNOSIS — F419 Anxiety disorder, unspecified: Secondary | ICD-10-CM | POA: Diagnosis not present

## 2014-12-23 ENCOUNTER — Other Ambulatory Visit (HOSPITAL_COMMUNITY): Payer: Self-pay | Admitting: Pulmonary Disease

## 2014-12-23 ENCOUNTER — Ambulatory Visit (HOSPITAL_COMMUNITY)
Admission: RE | Admit: 2014-12-23 | Discharge: 2014-12-23 | Disposition: A | Discharge status: Home / Self Care | Payer: Medicare Other | Source: Ambulatory Visit | Attending: Pulmonary Disease | Admitting: Pulmonary Disease

## 2014-12-23 DIAGNOSIS — J449 Chronic obstructive pulmonary disease, unspecified: Secondary | ICD-10-CM

## 2014-12-23 DIAGNOSIS — Z720 Tobacco use: Secondary | ICD-10-CM | POA: Insufficient documentation

## 2015-02-13 DIAGNOSIS — D485 Neoplasm of uncertain behavior of skin: Secondary | ICD-10-CM | POA: Diagnosis not present

## 2015-02-13 DIAGNOSIS — D045 Carcinoma in situ of skin of trunk: Secondary | ICD-10-CM | POA: Diagnosis not present

## 2015-02-13 DIAGNOSIS — X32XXXA Exposure to sunlight, initial encounter: Secondary | ICD-10-CM | POA: Diagnosis not present

## 2015-02-13 DIAGNOSIS — L570 Actinic keratosis: Secondary | ICD-10-CM | POA: Diagnosis not present

## 2015-03-10 DIAGNOSIS — N189 Chronic kidney disease, unspecified: Secondary | ICD-10-CM | POA: Diagnosis not present

## 2015-03-10 DIAGNOSIS — J449 Chronic obstructive pulmonary disease, unspecified: Secondary | ICD-10-CM | POA: Diagnosis not present

## 2015-03-10 DIAGNOSIS — M159 Polyosteoarthritis, unspecified: Secondary | ICD-10-CM | POA: Diagnosis not present

## 2015-03-10 DIAGNOSIS — M545 Low back pain: Secondary | ICD-10-CM | POA: Diagnosis not present

## 2015-03-28 DIAGNOSIS — M4806 Spinal stenosis, lumbar region: Secondary | ICD-10-CM | POA: Diagnosis not present

## 2015-03-28 DIAGNOSIS — M47816 Spondylosis without myelopathy or radiculopathy, lumbar region: Secondary | ICD-10-CM | POA: Diagnosis not present

## 2015-04-13 DIAGNOSIS — Z85828 Personal history of other malignant neoplasm of skin: Secondary | ICD-10-CM | POA: Diagnosis not present

## 2015-04-13 DIAGNOSIS — Z08 Encounter for follow-up examination after completed treatment for malignant neoplasm: Secondary | ICD-10-CM | POA: Diagnosis not present

## 2015-04-13 DIAGNOSIS — D485 Neoplasm of uncertain behavior of skin: Secondary | ICD-10-CM | POA: Diagnosis not present

## 2015-04-13 DIAGNOSIS — L570 Actinic keratosis: Secondary | ICD-10-CM | POA: Diagnosis not present

## 2015-06-09 DIAGNOSIS — J449 Chronic obstructive pulmonary disease, unspecified: Secondary | ICD-10-CM | POA: Diagnosis not present

## 2015-06-09 DIAGNOSIS — M545 Low back pain: Secondary | ICD-10-CM | POA: Diagnosis not present

## 2015-06-09 DIAGNOSIS — N189 Chronic kidney disease, unspecified: Secondary | ICD-10-CM | POA: Diagnosis not present

## 2015-06-09 DIAGNOSIS — M159 Polyosteoarthritis, unspecified: Secondary | ICD-10-CM | POA: Diagnosis not present

## 2015-10-05 DIAGNOSIS — Z23 Encounter for immunization: Secondary | ICD-10-CM | POA: Diagnosis not present

## 2016-01-05 DIAGNOSIS — J449 Chronic obstructive pulmonary disease, unspecified: Secondary | ICD-10-CM | POA: Diagnosis not present

## 2016-01-05 DIAGNOSIS — G4700 Insomnia, unspecified: Secondary | ICD-10-CM | POA: Diagnosis not present

## 2016-01-05 DIAGNOSIS — M159 Polyosteoarthritis, unspecified: Secondary | ICD-10-CM | POA: Diagnosis not present

## 2016-01-05 DIAGNOSIS — M545 Low back pain: Secondary | ICD-10-CM | POA: Diagnosis not present

## 2016-01-09 DIAGNOSIS — M47816 Spondylosis without myelopathy or radiculopathy, lumbar region: Secondary | ICD-10-CM | POA: Diagnosis not present

## 2016-01-09 DIAGNOSIS — M4806 Spinal stenosis, lumbar region: Secondary | ICD-10-CM | POA: Diagnosis not present

## 2016-02-07 DIAGNOSIS — M4316 Spondylolisthesis, lumbar region: Secondary | ICD-10-CM | POA: Diagnosis not present

## 2016-02-07 DIAGNOSIS — M4806 Spinal stenosis, lumbar region: Secondary | ICD-10-CM | POA: Diagnosis not present

## 2016-02-07 DIAGNOSIS — Z9889 Other specified postprocedural states: Secondary | ICD-10-CM | POA: Diagnosis not present

## 2016-02-07 DIAGNOSIS — M5136 Other intervertebral disc degeneration, lumbar region: Secondary | ICD-10-CM | POA: Diagnosis not present

## 2016-02-07 DIAGNOSIS — M47816 Spondylosis without myelopathy or radiculopathy, lumbar region: Secondary | ICD-10-CM | POA: Diagnosis not present

## 2016-04-05 DIAGNOSIS — Z125 Encounter for screening for malignant neoplasm of prostate: Secondary | ICD-10-CM | POA: Diagnosis not present

## 2016-04-05 DIAGNOSIS — M545 Low back pain: Secondary | ICD-10-CM | POA: Diagnosis not present

## 2016-04-05 DIAGNOSIS — J449 Chronic obstructive pulmonary disease, unspecified: Secondary | ICD-10-CM | POA: Diagnosis not present

## 2016-04-05 DIAGNOSIS — Z79891 Long term (current) use of opiate analgesic: Secondary | ICD-10-CM | POA: Diagnosis not present

## 2016-04-05 DIAGNOSIS — M159 Polyosteoarthritis, unspecified: Secondary | ICD-10-CM | POA: Diagnosis not present

## 2016-04-05 DIAGNOSIS — N189 Chronic kidney disease, unspecified: Secondary | ICD-10-CM | POA: Diagnosis not present

## 2016-05-01 ENCOUNTER — Ambulatory Visit (INDEPENDENT_AMBULATORY_CARE_PROVIDER_SITE_OTHER): Payer: Medicare Other | Admitting: Urology

## 2016-05-01 ENCOUNTER — Other Ambulatory Visit: Payer: Self-pay | Admitting: Urology

## 2016-05-01 DIAGNOSIS — R972 Elevated prostate specific antigen [PSA]: Secondary | ICD-10-CM

## 2016-05-22 ENCOUNTER — Ambulatory Visit (HOSPITAL_COMMUNITY)
Admission: RE | Admit: 2016-05-22 | Discharge: 2016-05-22 | Disposition: A | Discharge status: Home / Self Care | Payer: Medicare Other | Source: Ambulatory Visit | Attending: Urology | Admitting: Urology

## 2016-05-22 ENCOUNTER — Encounter (HOSPITAL_COMMUNITY): Payer: Self-pay

## 2016-05-22 DIAGNOSIS — R972 Elevated prostate specific antigen [PSA]: Secondary | ICD-10-CM

## 2016-05-22 MED ORDER — GENTAMICIN SULFATE 40 MG/ML IJ SOLN
80.0000 mg | Freq: Once | INTRAMUSCULAR | Status: AC
  Administered 2016-05-22: 80 mg via INTRAMUSCULAR

## 2016-05-22 MED ORDER — GENTAMICIN SULFATE 40 MG/ML IJ SOLN
INTRAMUSCULAR | Status: AC
  Administered 2016-05-22: 80 mg via INTRAMUSCULAR
  Filled 2016-05-22: qty 2

## 2016-05-22 MED ORDER — LIDOCAINE HCL (PF) 2 % IJ SOLN
INTRAMUSCULAR | Status: AC
  Administered 2016-05-22: 10 mL
  Filled 2016-05-22: qty 10

## 2016-05-22 MED ORDER — LIDOCAINE HCL (PF) 2 % IJ SOLN
10.0000 mL | Freq: Once | INTRAMUSCULAR | Status: AC
  Administered 2016-05-22: 10 mL

## 2016-05-22 NOTE — Discharge Instructions (Signed)
Transrectal Ultrasound-Guided Biopsy °A transrectal ultrasound-guided biopsy is a procedure to take samples of tissue from your prostate. Ultrasound images are used to guide the procedure. It is usually done to check the prostate gland for cancer. °BEFORE THE PROCEDURE °· Do not eat or drink after midnight on the night before your procedure. °· Take medicines as your doctor tells you. °· Your doctor may have you stop taking some medicines 5-7 days before the procedure. °· You will be given an enema before your procedure. During an enema, a liquid is put into your butt (rectum) to clear out waste. °· You may have lab tests the day of your procedure. °· Make plans to have someone drive you home. °PROCEDURE °· You will be given medicine to help you relax before the procedure. An IV tube will be put into one of your veins. It will be used to give fluids and medicine. °· You will be given medicine to reduce the risk of infection (antibiotic). °· You will be placed on your side. °· A probe with gel will be put in your butt. This is used to take pictures of your prostate and the area around it. °· A medicine to numb the area is put into your prostate. °· A biopsy needle is then inserted and guided to your prostate. °· Samples of prostate tissue are taken. The needle is removed. °· The samples are sent to a lab to be checked. Results are usually back in 2-3 days. °AFTER THE PROCEDURE °· You will be taken to a room where you will be watched until you are doing okay. °· You may have some pain in the area around your butt. You will be given medicines for this. °· You may be able to go home the same day. Sometimes, an overnight stay in the hospital is needed. °  °This information is not intended to replace advice given to you by your health care provider. Make sure you discuss any questions you have with your health care provider. °  °Document Released: 10/02/2009 Document Revised: 10/19/2013 Document Reviewed:  06/02/2013 °Elsevier Interactive Patient Education ©2016 [employer] ° °

## 2016-05-22 NOTE — Sedation Documentation (Signed)
Patient unable to tolerate procedure. MD advises he will reschedule him and do under sedation.

## 2016-05-29 NOTE — Patient Instructions (Signed)
       Ivan [name]  05/29/2016     @PREFPERIOPPHARMACY @   Your procedure is scheduled on  06/05/2016   Report to [facility] at  1100  A.M.  Call this number if you have problems the morning of surgery:  [phone]   Remember:  Do not eat food or drink liquids after midnight.  Take these medicines the morning of surgery with A SIP OF WATER  Xanax, zofran, oxycodone, flomax.   Do not wear jewelry, make-up or nail polish.  Do not wear lotions, powders, or perfumes.  You may wear deoderant.  Do not shave 48 hours prior to surgery.  Men may shave face and neck.  Do not bring valuables to the hospital.  [facility] is not responsible for any belongings or valuables.  Contacts, dentures or bridgework may not be worn into surgery.  Leave your suitcase in the car.  After surgery it may be brought to your room.  For patients admitted to the hospital, discharge time will be determined by your treatment team.  Patients discharged the day of surgery will not be allowed to drive home.   Name and phone number of your driver:   family Special instructions:  none  Please read over the following fact sheets that you were given. Coughing and Deep Breathing, Surgical Site Infection Prevention, Anesthesia Post-op Instructions and Care and Recovery After Surgery        PATIENT INSTRUCTIONS POST-ANESTHESIA  IMMEDIATELY FOLLOWING SURGERY:  Do not drive or operate machinery for the first twenty four hours after surgery.  Do not make any important decisions for twenty four hours after surgery or while taking narcotic pain medications or sedatives.  If you develop intractable nausea and vomiting or a severe headache please notify your doctor immediately.  FOLLOW-UP:  Please make an appointment with your surgeon as instructed. You do not need to follow up with anesthesia unless specifically instructed to do so.  WOUND CARE INSTRUCTIONS (if applicable):  Keep a dry clean dressing on the  anesthesia/puncture wound site if there is drainage.  Once the wound has quit draining you may leave it open to air.  Generally you should leave the bandage intact for twenty four hours unless there is drainage.  If the epidural site drains for more than 36-48 hours please call the anesthesia department.  QUESTIONS?:  Please feel free to call your physician or the hospital operator if you have any questions, and they will be happy to assist you.

## 2016-05-30 ENCOUNTER — Other Ambulatory Visit: Payer: Self-pay

## 2016-05-30 ENCOUNTER — Encounter (HOSPITAL_COMMUNITY): Payer: Self-pay

## 2016-05-30 ENCOUNTER — Encounter (HOSPITAL_COMMUNITY)
Admission: RE | Admit: 2016-05-30 | Discharge: 2016-05-30 | Disposition: A | Discharge status: Home / Self Care | Payer: Medicare Other | Source: Ambulatory Visit | Attending: Urology | Admitting: Urology

## 2016-05-30 DIAGNOSIS — Z01812 Encounter for preprocedural laboratory examination: Secondary | ICD-10-CM | POA: Diagnosis not present

## 2016-05-30 DIAGNOSIS — A419 Sepsis, unspecified organism: Secondary | ICD-10-CM | POA: Diagnosis not present

## 2016-05-30 DIAGNOSIS — Z01810 Encounter for preprocedural cardiovascular examination: Secondary | ICD-10-CM | POA: Diagnosis not present

## 2016-05-30 LAB — CBC
HCT: 51.3 % (ref 39.0–52.0)
HEMOGLOBIN: 17.2 g/dL — AB (ref 13.0–17.0)
MCH: 30.4 pg (ref 26.0–34.0)
MCHC: 33.5 g/dL (ref 30.0–36.0)
MCV: 90.8 fL (ref 78.0–100.0)
Platelets: 116 10*3/uL — ABNORMAL LOW (ref 150–400)
RBC: 5.65 MIL/uL (ref 4.22–5.81)
RDW: 13.2 % (ref 11.5–15.5)
WBC: 9 10*3/uL (ref 4.0–10.5)

## 2016-05-30 LAB — BASIC METABOLIC PANEL
ANION GAP: 6 (ref 5–15)
BUN: 21 mg/dL — ABNORMAL HIGH (ref 6–20)
CALCIUM: 10.8 mg/dL — AB (ref 8.9–10.3)
CO2: 25 mmol/L (ref 22–32)
CREATININE: 1.31 mg/dL — AB (ref 0.61–1.24)
Chloride: 106 mmol/L (ref 101–111)
GFR calc Af Amer: >60 mL/min (ref >60)
GFR calc non Af Amer: 54 mL/min — ABNORMAL LOW (ref >60)
Glucose, Bld: 113 mg/dL — ABNORMAL HIGH (ref 65–99)
Potassium: 4.7 mmol/L (ref 3.5–5.1)
SODIUM: 137 mmol/L (ref 135–145)

## 2016-05-30 LAB — SURGICAL PCR SCREEN
MRSA, PCR: NEGATIVE
STAPHYLOCOCCUS AUREUS: NEGATIVE

## 2016-05-31 ENCOUNTER — Other Ambulatory Visit: Payer: Self-pay | Admitting: Urology

## 2016-05-31 DIAGNOSIS — R972 Elevated prostate specific antigen [PSA]: Secondary | ICD-10-CM

## 2016-06-05 ENCOUNTER — Ambulatory Visit (HOSPITAL_COMMUNITY): Payer: Medicare Other | Admitting: Anesthesiology

## 2016-06-05 ENCOUNTER — Ambulatory Visit (HOSPITAL_COMMUNITY)
Admission: RE | Admit: 2016-06-05 | Discharge: 2016-06-05 | Disposition: A | Discharge status: Home / Self Care | Payer: Medicare Other | Source: Ambulatory Visit | Attending: Urology | Admitting: Urology

## 2016-06-05 ENCOUNTER — Encounter (HOSPITAL_COMMUNITY)
Admission: RE | Disposition: A | Discharge status: Home / Self Care | Payer: Self-pay | Source: Ambulatory Visit | Attending: Urology

## 2016-06-05 ENCOUNTER — Encounter (HOSPITAL_COMMUNITY): Payer: Self-pay | Admitting: Anesthesiology

## 2016-06-05 DIAGNOSIS — J449 Chronic obstructive pulmonary disease, unspecified: Secondary | ICD-10-CM | POA: Diagnosis not present

## 2016-06-05 DIAGNOSIS — R972 Elevated prostate specific antigen [PSA]: Secondary | ICD-10-CM | POA: Insufficient documentation

## 2016-06-05 DIAGNOSIS — I2510 Atherosclerotic heart disease of native coronary artery without angina pectoris: Secondary | ICD-10-CM | POA: Insufficient documentation

## 2016-06-05 DIAGNOSIS — C61 Malignant neoplasm of prostate: Secondary | ICD-10-CM | POA: Diagnosis not present

## 2016-06-05 DIAGNOSIS — I252 Old myocardial infarction: Secondary | ICD-10-CM | POA: Insufficient documentation

## 2016-06-05 DIAGNOSIS — Z79899 Other long term (current) drug therapy: Secondary | ICD-10-CM | POA: Insufficient documentation

## 2016-06-05 DIAGNOSIS — F17210 Nicotine dependence, cigarettes, uncomplicated: Secondary | ICD-10-CM | POA: Insufficient documentation

## 2016-06-05 DIAGNOSIS — M1990 Unspecified osteoarthritis, unspecified site: Secondary | ICD-10-CM | POA: Insufficient documentation

## 2016-06-05 DIAGNOSIS — Z905 Acquired absence of kidney: Secondary | ICD-10-CM | POA: Insufficient documentation

## 2016-06-05 HISTORY — DX: Unspecified osteoarthritis, unspecified site: M19.90

## 2016-06-05 HISTORY — DX: Chronic obstructive pulmonary disease, unspecified: J44.9

## 2016-06-05 HISTORY — PX: PROSTATE BIOPSY: SHX241

## 2016-06-05 SURGERY — BIOPSY, PROSTATE
Anesthesia: Monitor Anesthesia Care

## 2016-06-05 MED ORDER — LACTATED RINGERS IV SOLN
INTRAVENOUS | Status: DC
  Administered 2016-06-05: 12:00:00 via INTRAVENOUS

## 2016-06-05 MED ORDER — LIDOCAINE HCL (CARDIAC) 10 MG/ML IV SOLN
INTRAVENOUS | Status: DC | PRN
  Administered 2016-06-05: 25 mg via INTRAVENOUS

## 2016-06-05 MED ORDER — LIDOCAINE HCL (PF) 1 % IJ SOLN
INTRAMUSCULAR | Status: AC
  Filled 2016-06-05: qty 30

## 2016-06-05 MED ORDER — LIDOCAINE HCL (PF) 2 % IJ SOLN
INTRAMUSCULAR | Status: AC
  Filled 2016-06-05: qty 10

## 2016-06-05 MED ORDER — PROPOFOL 500 MG/50ML IV EMUL
INTRAVENOUS | Status: DC | PRN
  Administered 2016-06-05: 50 ug/kg/min via INTRAVENOUS

## 2016-06-05 MED ORDER — MIDAZOLAM HCL 5 MG/5ML IJ SOLN
INTRAMUSCULAR | Status: DC | PRN
  Administered 2016-06-05 (×2): 1 mg via INTRAVENOUS

## 2016-06-05 MED ORDER — ONDANSETRON HCL 4 MG/2ML IJ SOLN: 4.0000 mg | Freq: Once | INTRAMUSCULAR | Status: DC | PRN

## 2016-06-05 MED ORDER — MIDAZOLAM HCL 2 MG/2ML IJ SOLN
1.0000 mg | INTRAMUSCULAR | Status: DC | PRN
  Administered 2016-06-05: 2 mg via INTRAVENOUS
  Filled 2016-06-05: qty 2

## 2016-06-05 MED ORDER — FENTANYL CITRATE (PF) 100 MCG/2ML IJ SOLN
25.0000 ug | INTRAMUSCULAR | Status: DC | PRN
  Administered 2016-06-05: 25 ug via INTRAVENOUS
  Filled 2016-06-05: qty 2

## 2016-06-05 MED ORDER — GENTAMICIN IN SALINE 1.6-0.9 MG/ML-% IV SOLN
80.0000 mg | Freq: Once | INTRAVENOUS | Status: AC
  Administered 2016-06-05: 80 mg via INTRAVENOUS
  Filled 2016-06-05: qty 50

## 2016-06-05 MED ORDER — LIDOCAINE HCL 2 % IJ SOLN
INTRAMUSCULAR | Status: DC | PRN
  Administered 2016-06-05: 10 mL

## 2016-06-05 MED ORDER — FENTANYL CITRATE (PF) 100 MCG/2ML IJ SOLN: 25.0000 ug | INTRAMUSCULAR | Status: DC | PRN

## 2016-06-05 SURGICAL SUPPLY — 15 items
BAG HAMPER (MISCELLANEOUS) ×3 IMPLANT
COVER TABLE BACK 60X90 (DRAPES) IMPLANT
DRSG TELFA 3X8 NADH (GAUZE/BANDAGES/DRESSINGS) IMPLANT
GLOVE BIO SURGEON STRL SZ8 (GLOVE) ×3 IMPLANT
INST BIOPSY MAXCORE 18GX25 (NEEDLE) ×3 IMPLANT
INSTR BIOPSY MAXCORE 18GX20 (NEEDLE) IMPLANT
KIT ROOM TURNOVER AP CYSTO (KITS) ×3 IMPLANT
NEEDLE GUIDE ×3 IMPLANT
NEEDLE SPNL 22GX8 TOUHY BVL BK (NEEDLE) ×3 IMPLANT
PAD ARMBOARD 7.5X6 YLW CONV (MISCELLANEOUS) ×3 IMPLANT
SURGILUBE 2OZ TUBE FLIPTOP (MISCELLANEOUS) ×3 IMPLANT
SYR CONTROL 10ML LL (SYRINGE) ×3 IMPLANT
TOWEL OR 17X24 6PK STRL BLUE (TOWEL DISPOSABLE) ×3 IMPLANT
TUBE CONNECTING 12'X1/4 (SUCTIONS)
TUBE CONNECTING 12X1/4 (SUCTIONS) IMPLANT

## 2016-06-05 NOTE — Anesthesia Preprocedure Evaluation (Signed)
Anesthesia Evaluation  Patient identified by MRN, date of birth, ID band Patient awake    Airway Mallampati: II  TM Distance: >3 FB     Dental  (+) Edentulous Upper, Poor Dentition, Missing, Dental Advisory Given   Pulmonary COPD, Current Smoker,    breath sounds clear to auscultation       Cardiovascular + CAD and + Past MI   Rhythm:Regular Rate:Normal     Neuro/Psych    GI/Hepatic   Endo/Other    Renal/GU      Musculoskeletal   Abdominal   Peds  Hematology   Anesthesia Other Findings Chronic hoarseness unknown etiology. No stridor or SOB.  Reproductive/Obstetrics                             Anesthesia Physical Anesthesia Plan  ASA: III  Anesthesia Plan: MAC   Post-op Pain Management:    Induction: Intravenous  Airway Management Planned: Simple Face Mask  Additional Equipment:   Intra-op Plan:   Post-operative Plan:   Informed Consent: I have reviewed the patients History and Physical, chart, labs and discussed the procedure including the risks, benefits and alternatives for the proposed anesthesia with the patient or authorized representative who has indicated his/her understanding and acceptance.     Plan Discussed with:   Anesthesia Plan Comments:         Anesthesia Quick Evaluation

## 2016-06-05 NOTE — H&P (Signed)
Urology Admission H&P  Chief Complaint: elevated PSA  History of Present Illness: Mr Scurto is a 70yo with elevated PSA who is here for prostate biopsy. A previous biopsy was attempted awake and the patient did not tolerate the procedure due to pain. He denies any LUTS  Past Medical History:  Diagnosis Date  . Arthritis   . Chronic back pain   . COPD (chronic obstructive pulmonary disease) ([place])   . Hypercalcemia   . Kidney stones   . Myocardial infarction ([place])   . Sepsis ([place])   . UTI (lower urinary tract infection)    Past Surgical History:  Procedure Laterality Date  . APPENDECTOMY    . BACK SURGERY     10/2013  . HERNIA REPAIR    . NEPHRECTOMY Left    04/2014    Home Medications:  Prescriptions Prior to Admission  Medication Sig Dispense Refill Last Dose  . Multiple Vitamin (MULTIVITAMIN WITH MINERALS) TABS tablet Take 1 tablet by mouth daily.   Past Week at Unknown time  . ondansetron (ZOFRAN-ODT) 4 MG disintegrating tablet Take 1 tablet by mouth every 8 (eight) hours as needed for nausea or vomiting.   0 06/05/2016 at 0100  . Potassium 95 MG TABS Take 1 tablet by mouth daily.   Past Week at Unknown time  . ALPRAZolam (XANAX) 1 MG tablet Take 1 mg by mouth at bedtime as needed for sleep.   5 06/05/2016 at 1000  . B Complex-C (B-COMPLEX WITH VITAMIN C) tablet Take 1 tablet by mouth daily.   More than a month at Unknown time  . cefUROXime (CEFTIN) 500 MG tablet Take 1 tablet (500 mg total) by mouth 2 (two) times daily with a meal. 14 tablet 0 Unknown at Unknown time  . nitrofurantoin, macrocrystal-monohydrate, (MACROBID) 100 MG capsule Take 1 capsule (100 mg total) by mouth 2 (two) times daily. 30 capsule 12 06/03/2016 at 0600  . Oxycodone HCl 10 MG TABS Take 1 tablet by mouth 2 (two) times daily as needed (pain).   0 06/05/2016 at 0400  . polyethylene glycol (MIRALAX / GLYCOLAX) packet Take 17 g by mouth daily as needed.   06/04/2016 at 0600  . tamsulosin (FLOMAX) 0.4 MG CAPS  capsule Take 0.4 mg by mouth daily.  11 09/14/2014 at Unknown time   Allergies: No Known Allergies  Family History  Problem Relation Age of Onset  . Cancer Mother   . Cancer Brother    Social History:  reports that he has been smoking Cigarettes.  He has a 50.00 pack-year smoking history. He does not have any smokeless tobacco history on file. He reports that he does not drink alcohol or use drugs.  Review of Systems  All other systems reviewed and are negative.   Physical Exam:  Vital signs in last 24 hours: Temp:  [97.7 F (36.5 C)] 97.7 F (36.5 C) (08/09 1110) Pulse Rate:  [91] 91 (08/09 1110) Resp:  [14-25] 24 (08/09 1155) BP: (115-165)/(66-96) 122/82 (08/09 1155) SpO2:  [96 %-99 %] 97 % (08/09 1155) Weight:  [73.5 kg (162 lb)] 73.5 kg (162 lb) (08/09 1110) Physical Exam  Constitutional: He is oriented to person, place, and time. He appears well-developed and well-nourished.  HENT:  Head: Normocephalic and atraumatic.  Eyes: EOM are normal. Pupils are equal, round, and reactive to light.  Neck: Normal range of motion. No thyromegaly present.  Cardiovascular: Normal rate and regular rhythm.   Respiratory: Effort normal. No respiratory distress.  GI: Soft.  He exhibits no distension.  Musculoskeletal: Normal range of motion.  Neurological: He is alert and oriented to person, place, and time.  Skin: Skin is warm and dry.  Psychiatric: He has a normal mood and affect. His behavior is normal. Judgment and thought content normal.    Laboratory Data:  No results found for this or any previous visit (from the past 24 hour(s)). Recent Results (from the past 240 hour(s))  Surgical pcr screen     Status: None   Collection Time: 05/30/16  4:17 PM  Result Value Ref Range Status   MRSA, PCR NEGATIVE NEGATIVE Final   Staphylococcus aureus NEGATIVE NEGATIVE Final    Comment:        The Xpert SA Assay (FDA approved for NASAL specimens in patients over 65 years of age), is one  component of a comprehensive surveillance program.  Test performance has been validated by [facility] for patients greater than or equal to 64 year old. It is not intended to diagnose infection nor to guide or monitor treatment.    Creatinine:  Recent Labs  05/30/16 1300  CREATININE 1.31*   Baseline Creatinine: 1.3  Impression/Assessment:  69yo with elevated PSA  Plan:  The risks/benefits/alternatives to prostate biopsy was explained to the patietn and he understands and wishes to proceed with surgery  [name] 06/05/2016, 12:24 PM

## 2016-06-05 NOTE — Anesthesia Postprocedure Evaluation (Signed)
Anesthesia Post Note  Patient: Ivan [name]  Procedure(s) Performed: Procedure(s) (LRB): PROSTATE BIOPSY (N/A)  Patient location during evaluation: PACU Anesthesia Type: MAC Level of consciousness: awake and alert and oriented Pain management: pain level controlled Vital Signs Assessment: post-procedure vital signs reviewed and stable Respiratory status: spontaneous breathing and patient connected to nasal cannula oxygen Cardiovascular status: stable Postop Assessment: no signs of nausea or vomiting Anesthetic complications: no    Last Vitals:  Vitals:   06/05/16 1235 06/05/16 1307  BP: 128/78 (P) 125/85  Pulse:  (P) 76  Resp: (!) 38 (P) 14  Temp:  (P) 36.6 C    Last Pain:  Vitals:   06/05/16 1110  TempSrc: Oral  PainSc: 2                  Ajai Harville A

## 2016-06-05 NOTE — Transfer of Care (Signed)
Immediate Anesthesia Transfer of Care Note  Patient: Ivan [name]  Procedure(s) Performed: Procedure(s): PROSTATE BIOPSY (N/A)  Patient Location: PACU  Anesthesia Type:MAC  Level of Consciousness: awake, alert , oriented and patient cooperative  Airway & Oxygen Therapy: Patient Spontanous Breathing and Patient connected to nasal cannula oxygen  Post-op Assessment: Report given to RN and Post -op Vital signs reviewed and stable  Post vital signs: Reviewed and stable  Last Vitals:  Vitals:   06/05/16 1230 06/05/16 1235  BP: 133/89 128/78  Pulse:    Resp: 19 (!) 38  Temp:      Last Pain:  Vitals:   06/05/16 1110  TempSrc: Oral  PainSc: 2       Patients Stated Pain Goal: 8 ([id number])  Complications: No apparent anesthesia complications

## 2016-06-05 NOTE — Brief Op Note (Signed)
06/05/2016  1:00 PM  PATIENT:  Traci Sermon  70 y.o. male  PRE-OPERATIVE DIAGNOSIS:  elevated prostate antigen  POST-OPERATIVE DIAGNOSIS:  elevated prostate antigen  PROCEDURE:  Procedure(s): PROSTATE BIOPSY (N/A)  SURGEON:  Surgeon(s) and Role:    * Cleon Gustin, MD - Primary  PHYSICIAN ASSISTANT:   ASSISTANTS: none   ANESTHESIA:   MAC  EBL:  Total I/O In: 100 [I.V.:100] Out: -   BLOOD ADMINISTERED:none  DRAINS: none   LOCAL MEDICATIONS USED:  LIDOCAINE  and Amount: 10 ml  SPECIMEN:  Source of Specimen:  prostate   DISPOSITION OF SPECIMEN:  PATHOLOGY  COUNTS:  YES  TOURNIQUET:  * No tourniquets in log *  DICTATION: .Note written in EPIC  PLAN OF CARE: Discharge to home after PACU  PATIENT DISPOSITION:  PACU - hemodynamically stable.   Delay start of Pharmacological VTE agent (>24hrs) due to surgical blood loss or risk of bleeding: not applicable

## 2016-06-05 NOTE — Anesthesia Procedure Notes (Signed)
Procedure Name: MAC Date/Time: 06/05/2016 12:36 PM Performed by: Andree Elk, AMY A Pre-anesthesia Checklist: Patient identified, Timeout performed, Emergency Drugs available, Suction available and Patient being monitored Oxygen Delivery Method: Simple face mask

## 2016-06-06 ENCOUNTER — Other Ambulatory Visit: Payer: Self-pay

## 2016-06-10 NOTE — Op Note (Signed)
Pre op diagnosis: Elevated PSA  Post op diagnosis: Elevated PSA        Procedure: Transrectal ultrasound of the prostate, Ultrasound Guided Prostate needle biopsy.   Attending: [name]  Anesthesia: General  EBL: minimal  Antibiotics: Gentamicin  Drains: none  Findings: 29g prostate with prominent right lobe. Prostate measurements: width 3.5cm, height 3cm, length 4cm ho hypoechoic or hyperechoic lesions   Indications: Pt is a 70yo male with a history of elevated PSA and abnormal DRE. After discussing workup he has elected to proceed with prostate biopsy  Procedure in detail: Prior to procedure consent was obtained. The patient was brought to the OR and a brief timeout was done to ensure correct patient, correct procedure, and correct site. General anesthesia was administered and patient was placed in the dorsal lithotomy position. The prostate size was estimated to be 40g by digital rectal exam. The 10 MHz transrectal ultrasound probe was placed into the rectum. Prostate width measured 3.56cm, height of 3cm and length of 3.1cm . Prostate volume was measured to be 35 cc. The biopsy cores were obtained using direct, real-time ultrasound guidance utilizing a standard 14core pattern with one core from the right apex lateral using real time ultrasound guidance to direct the biopsy core being taken from this location, right apex medial using real time ultrasound guidance to direct the biopsy core being taken from this location, left apex lateral using real time ultrasound guidance to direct the biopsy core being taken from this location, left apex medial using real time ultrasound guidance to direct the biopsy core being taken from this location, 2 right mid lateral using real time ultrasound guidance to direct the biopsy core being taken from this location, 2 right mid medial using real time ultrasound guidance to direct the biopsy core being taken from this location, left mid lateral using  real time ultrasound guidance to direct the biopsy core being taken from this location, left mid medial using real time ultrasound guidance to direct the biopsy core being taken from this location, right base lateral using real time ultrasound guidance to direct the biopsy core being taken from this location, right base medial using real time ultrasound guidance to direct the biopsy core being taken from this location, left base lateral using real time ultrasound guidance to direct the biopsy core being taken from this location and left base medial of the prostate using real time ultrasound guidance to direct the biopsy core being taken from this location. The biopsy cores were placed in buffered formalin and sent to pathology. This then concluded the procedure which was well tolerated by the patient.   Complications:. None.   Condition: Stable, extubated, transferred to PACU  Plan: Patient is to be discharged home. They are to followup in 1 week for pathology discussion

## 2016-06-11 ENCOUNTER — Encounter (HOSPITAL_COMMUNITY): Payer: Self-pay | Admitting: Urology

## 2016-06-12 ENCOUNTER — Ambulatory Visit (INDEPENDENT_AMBULATORY_CARE_PROVIDER_SITE_OTHER): Payer: Medicare Other | Admitting: Urology

## 2016-06-12 ENCOUNTER — Other Ambulatory Visit: Payer: Self-pay | Admitting: Urology

## 2016-06-12 DIAGNOSIS — C61 Malignant neoplasm of prostate: Secondary | ICD-10-CM

## 2016-06-14 ENCOUNTER — Encounter (HOSPITAL_COMMUNITY)
Admission: RE | Admit: 2016-06-14 | Discharge: 2016-06-14 | Disposition: A | Discharge status: Home / Self Care | Payer: Medicare Other | Source: Ambulatory Visit | Attending: Urology | Admitting: Urology

## 2016-06-14 ENCOUNTER — Ambulatory Visit (HOSPITAL_COMMUNITY)
Admission: RE | Admit: 2016-06-14 | Discharge: 2016-06-14 | Disposition: A | Discharge status: Home / Self Care | Payer: Medicare Other | Source: Ambulatory Visit | Attending: Urology | Admitting: Urology

## 2016-06-14 ENCOUNTER — Other Ambulatory Visit: Payer: Self-pay | Admitting: Urology

## 2016-06-14 ENCOUNTER — Encounter (HOSPITAL_COMMUNITY): Payer: Self-pay

## 2016-06-14 DIAGNOSIS — R911 Solitary pulmonary nodule: Secondary | ICD-10-CM | POA: Insufficient documentation

## 2016-06-14 DIAGNOSIS — I2510 Atherosclerotic heart disease of native coronary artery without angina pectoris: Secondary | ICD-10-CM | POA: Diagnosis not present

## 2016-06-14 DIAGNOSIS — C61 Malignant neoplasm of prostate: Secondary | ICD-10-CM

## 2016-06-14 DIAGNOSIS — I714 Abdominal aortic aneurysm, without rupture: Secondary | ICD-10-CM | POA: Insufficient documentation

## 2016-06-14 DIAGNOSIS — M47896 Other spondylosis, lumbar region: Secondary | ICD-10-CM | POA: Insufficient documentation

## 2016-06-14 DIAGNOSIS — Z905 Acquired absence of kidney: Secondary | ICD-10-CM | POA: Diagnosis not present

## 2016-06-14 DIAGNOSIS — M5136 Other intervertebral disc degeneration, lumbar region: Secondary | ICD-10-CM | POA: Insufficient documentation

## 2016-06-14 MED ORDER — IOPAMIDOL (ISOVUE-300) INJECTION 61%
100.0000 mL | Freq: Once | INTRAVENOUS | Status: AC | PRN
  Administered 2016-06-14: 100 mL via INTRAVENOUS

## 2016-06-14 MED ORDER — TECHNETIUM TC 99M MEDRONATE IV KIT
25.0000 | PACK | Freq: Once | INTRAVENOUS | Status: AC | PRN
  Administered 2016-06-14: 20.1 via INTRAVENOUS

## 2016-06-26 ENCOUNTER — Other Ambulatory Visit: Payer: Self-pay

## 2016-06-28 ENCOUNTER — Ambulatory Visit (HOSPITAL_COMMUNITY): Payer: Medicare Other

## 2016-06-28 ENCOUNTER — Other Ambulatory Visit: Payer: Self-pay | Admitting: Urology

## 2016-06-28 DIAGNOSIS — C61 Malignant neoplasm of prostate: Secondary | ICD-10-CM | POA: Diagnosis not present

## 2016-06-28 DIAGNOSIS — Z8051 Family history of malignant neoplasm of kidney: Secondary | ICD-10-CM | POA: Diagnosis not present

## 2016-06-28 DIAGNOSIS — F17200 Nicotine dependence, unspecified, uncomplicated: Secondary | ICD-10-CM | POA: Diagnosis not present

## 2016-06-28 DIAGNOSIS — R490 Dysphonia: Secondary | ICD-10-CM | POA: Diagnosis not present

## 2016-06-28 DIAGNOSIS — N181 Chronic kidney disease, stage 1: Secondary | ICD-10-CM | POA: Diagnosis not present

## 2016-06-28 DIAGNOSIS — R972 Elevated prostate specific antigen [PSA]: Secondary | ICD-10-CM | POA: Diagnosis not present

## 2016-06-28 DIAGNOSIS — F17210 Nicotine dependence, cigarettes, uncomplicated: Secondary | ICD-10-CM | POA: Diagnosis not present

## 2016-06-28 DIAGNOSIS — R498 Other voice and resonance disorders: Secondary | ICD-10-CM | POA: Diagnosis not present

## 2016-06-28 DIAGNOSIS — M545 Low back pain: Secondary | ICD-10-CM | POA: Diagnosis not present

## 2016-06-28 DIAGNOSIS — Z79899 Other long term (current) drug therapy: Secondary | ICD-10-CM | POA: Diagnosis not present

## 2016-06-28 DIAGNOSIS — J449 Chronic obstructive pulmonary disease, unspecified: Secondary | ICD-10-CM | POA: Diagnosis not present

## 2016-06-28 DIAGNOSIS — E7800 Pure hypercholesterolemia, unspecified: Secondary | ICD-10-CM | POA: Diagnosis not present

## 2016-06-28 HISTORY — PX: OTHER SURGICAL HISTORY: SHX169

## 2016-07-08 ENCOUNTER — Ambulatory Visit (INDEPENDENT_AMBULATORY_CARE_PROVIDER_SITE_OTHER): Payer: Medicare Other | Admitting: Otolaryngology

## 2016-07-08 DIAGNOSIS — D380 Neoplasm of uncertain behavior of larynx: Secondary | ICD-10-CM | POA: Diagnosis not present

## 2016-07-08 DIAGNOSIS — Z23 Encounter for immunization: Secondary | ICD-10-CM | POA: Diagnosis not present

## 2016-07-08 DIAGNOSIS — R490 Dysphonia: Secondary | ICD-10-CM

## 2016-07-10 ENCOUNTER — Other Ambulatory Visit: Payer: Self-pay | Admitting: Otolaryngology

## 2016-07-10 ENCOUNTER — Ambulatory Visit (INDEPENDENT_AMBULATORY_CARE_PROVIDER_SITE_OTHER): Payer: Medicare Other | Admitting: Urology

## 2016-07-10 DIAGNOSIS — C61 Malignant neoplasm of prostate: Secondary | ICD-10-CM

## 2016-07-15 ENCOUNTER — Encounter (HOSPITAL_BASED_OUTPATIENT_CLINIC_OR_DEPARTMENT_OTHER): Payer: Self-pay | Admitting: *Deleted

## 2016-07-23 ENCOUNTER — Ambulatory Visit (HOSPITAL_BASED_OUTPATIENT_CLINIC_OR_DEPARTMENT_OTHER): Payer: Medicare Other | Admitting: Anesthesiology

## 2016-07-23 ENCOUNTER — Encounter (HOSPITAL_BASED_OUTPATIENT_CLINIC_OR_DEPARTMENT_OTHER): Payer: Self-pay | Admitting: Anesthesiology

## 2016-07-23 ENCOUNTER — Encounter (HOSPITAL_BASED_OUTPATIENT_CLINIC_OR_DEPARTMENT_OTHER)
Admission: RE | Disposition: A | Discharge status: Home / Self Care | Payer: Self-pay | Source: Ambulatory Visit | Attending: Otolaryngology

## 2016-07-23 ENCOUNTER — Ambulatory Visit (HOSPITAL_BASED_OUTPATIENT_CLINIC_OR_DEPARTMENT_OTHER)
Admission: RE | Admit: 2016-07-23 | Discharge: 2016-07-23 | Disposition: A | Discharge status: Home / Self Care | Payer: Medicare Other | Source: Ambulatory Visit | Attending: Otolaryngology | Admitting: Otolaryngology

## 2016-07-23 DIAGNOSIS — Z79899 Other long term (current) drug therapy: Secondary | ICD-10-CM | POA: Insufficient documentation

## 2016-07-23 DIAGNOSIS — N189 Chronic kidney disease, unspecified: Secondary | ICD-10-CM | POA: Diagnosis not present

## 2016-07-23 DIAGNOSIS — J383 Other diseases of vocal cords: Secondary | ICD-10-CM | POA: Diagnosis present

## 2016-07-23 DIAGNOSIS — Z792 Long term (current) use of antibiotics: Secondary | ICD-10-CM | POA: Diagnosis not present

## 2016-07-23 DIAGNOSIS — J382 Nodules of vocal cords: Secondary | ICD-10-CM | POA: Diagnosis not present

## 2016-07-23 DIAGNOSIS — I252 Old myocardial infarction: Secondary | ICD-10-CM | POA: Diagnosis not present

## 2016-07-23 DIAGNOSIS — F17210 Nicotine dependence, cigarettes, uncomplicated: Secondary | ICD-10-CM | POA: Insufficient documentation

## 2016-07-23 DIAGNOSIS — J449 Chronic obstructive pulmonary disease, unspecified: Secondary | ICD-10-CM | POA: Insufficient documentation

## 2016-07-23 DIAGNOSIS — C320 Malignant neoplasm of glottis: Secondary | ICD-10-CM | POA: Insufficient documentation

## 2016-07-23 DIAGNOSIS — C61 Malignant neoplasm of prostate: Secondary | ICD-10-CM | POA: Insufficient documentation

## 2016-07-23 DIAGNOSIS — D380 Neoplasm of uncertain behavior of larynx: Secondary | ICD-10-CM | POA: Diagnosis not present

## 2016-07-23 HISTORY — PX: MICROLARYNGOSCOPY: SHX5208

## 2016-07-23 LAB — POCT I-STAT, CHEM 8
BUN: 27 mg/dL — AB (ref 6–20)
CALCIUM ION: 1.4 mmol/L (ref 1.15–1.40)
CREATININE: 0.9 mg/dL (ref 0.61–1.24)
Chloride: 106 mmol/L (ref 101–111)
Glucose, Bld: 98 mg/dL (ref 65–99)
HEMATOCRIT: 53 % — AB (ref 39.0–52.0)
HEMOGLOBIN: 18 g/dL — AB (ref 13.0–17.0)
Potassium: 4.9 mmol/L (ref 3.5–5.1)
SODIUM: 140 mmol/L (ref 135–145)
TCO2: 20 mmol/L (ref 0–100)

## 2016-07-23 SURGERY — MICROLARYNGOSCOPY
Anesthesia: General | Site: Throat | Laterality: Bilateral

## 2016-07-23 MED ORDER — SCOPOLAMINE 1 MG/3DAYS TD PT72: 1.0000 | MEDICATED_PATCH | Freq: Once | TRANSDERMAL | Status: DC | PRN

## 2016-07-23 MED ORDER — GLYCOPYRROLATE 0.2 MG/ML IJ SOLN: 0.2000 mg | Freq: Once | INTRAMUSCULAR | Status: DC | PRN

## 2016-07-23 MED ORDER — HYDROMORPHONE HCL 1 MG/ML IJ SOLN
0.2500 mg | INTRAMUSCULAR | Status: DC | PRN
  Administered 2016-07-23 (×2): 0.5 mg via INTRAVENOUS

## 2016-07-23 MED ORDER — ONDANSETRON HCL 4 MG/2ML IJ SOLN
INTRAMUSCULAR | Status: DC | PRN
  Administered 2016-07-23: 4 mg via INTRAVENOUS

## 2016-07-23 MED ORDER — DEXAMETHASONE SODIUM PHOSPHATE 4 MG/ML IJ SOLN
INTRAMUSCULAR | Status: DC | PRN
  Administered 2016-07-23: 10 mg via INTRAVENOUS

## 2016-07-23 MED ORDER — FENTANYL CITRATE (PF) 100 MCG/2ML IJ SOLN
50.0000 ug | INTRAMUSCULAR | Status: DC | PRN
  Administered 2016-07-23: 50 ug via INTRAVENOUS

## 2016-07-23 MED ORDER — MIDAZOLAM HCL 2 MG/2ML IJ SOLN
INTRAMUSCULAR | Status: AC
  Filled 2016-07-23: qty 2

## 2016-07-23 MED ORDER — SUCCINYLCHOLINE CHLORIDE 20 MG/ML IJ SOLN
INTRAMUSCULAR | Status: DC | PRN
  Administered 2016-07-23: 100 mg via INTRAVENOUS

## 2016-07-23 MED ORDER — DEXAMETHASONE SODIUM PHOSPHATE 10 MG/ML IJ SOLN
INTRAMUSCULAR | Status: AC
  Filled 2016-07-23: qty 1

## 2016-07-23 MED ORDER — PROPOFOL 10 MG/ML IV BOLUS
INTRAVENOUS | Status: AC
  Filled 2016-07-23: qty 20

## 2016-07-23 MED ORDER — EPINEPHRINE HCL 1 MG/ML IJ SOLN
INTRAMUSCULAR | Status: AC
  Filled 2016-07-23: qty 1

## 2016-07-23 MED ORDER — ONDANSETRON HCL 4 MG/2ML IJ SOLN
INTRAMUSCULAR | Status: AC
  Filled 2016-07-23: qty 2

## 2016-07-23 MED ORDER — LACTATED RINGERS IV SOLN
INTRAVENOUS | Status: DC
  Administered 2016-07-23: 08:00:00 via INTRAVENOUS

## 2016-07-23 MED ORDER — EPINEPHRINE 1 MG/ML IJ SOLN
INTRAMUSCULAR | Status: DC | PRN
  Administered 2016-07-23: 2 mL via TOPICAL

## 2016-07-23 MED ORDER — HYDROMORPHONE HCL 1 MG/ML IJ SOLN
INTRAMUSCULAR | Status: AC
  Filled 2016-07-23: qty 1

## 2016-07-23 MED ORDER — FENTANYL CITRATE (PF) 100 MCG/2ML IJ SOLN
INTRAMUSCULAR | Status: AC
  Filled 2016-07-23: qty 2

## 2016-07-23 MED ORDER — MIDAZOLAM HCL 2 MG/2ML IJ SOLN
1.0000 mg | INTRAMUSCULAR | Status: DC | PRN
  Administered 2016-07-23: 2 mg via INTRAVENOUS

## 2016-07-23 MED ORDER — LIDOCAINE 2% (20 MG/ML) 5 ML SYRINGE
INTRAMUSCULAR | Status: DC | PRN
  Administered 2016-07-23: 100 mg via INTRAVENOUS

## 2016-07-23 MED ORDER — PROPOFOL 10 MG/ML IV BOLUS
INTRAVENOUS | Status: DC | PRN
  Administered 2016-07-23: 30 mg via INTRAVENOUS
  Administered 2016-07-23: 170 mg via INTRAVENOUS

## 2016-07-23 MED FILL — Epinephrine HCl Inj 1 MG/ML: INTRAMUSCULAR | Qty: 2 | Status: AC

## 2016-07-23 SURGICAL SUPPLY — 24 items
ADAPTER TUBE FLEX ULTRASET (MISCELLANEOUS) ×4 IMPLANT
CANISTER SUCT 1200ML W/VALVE (MISCELLANEOUS) ×4 IMPLANT
CONT SPEC 4OZ CLIKSEAL STRL BL (MISCELLANEOUS) IMPLANT
GLOVE BIO SURGEON STRL SZ7.5 (GLOVE) ×4 IMPLANT
GLOVE SURG SS PI 7.0 STRL IVOR (GLOVE) ×4 IMPLANT
GOWN STRL REUS W/ TWL LRG LVL3 (GOWN DISPOSABLE) ×2 IMPLANT
GOWN STRL REUS W/TWL LRG LVL3 (GOWN DISPOSABLE) ×2
GUARD TEETH (MISCELLANEOUS) ×4 IMPLANT
MARKER SKIN DUAL TIP RULER LAB (MISCELLANEOUS) IMPLANT
NEEDLE HYPO 18GX1.5 BLUNT FILL (NEEDLE) IMPLANT
NEEDLE SPNL 22GX7 QUINCKE BK (NEEDLE) IMPLANT
NEEDLE SPNL 25GX3.5 QUINCKE BL (NEEDLE) IMPLANT
NS IRRIG 1000ML POUR BTL (IV SOLUTION) ×4 IMPLANT
PATTIES SURGICAL .5 X3 (DISPOSABLE) ×4 IMPLANT
SHEET MEDIUM DRAPE 40X70 STRL (DRAPES) ×4 IMPLANT
SLEEVE SCD COMPRESS KNEE MED (MISCELLANEOUS) ×4 IMPLANT
SOLUTION BUTLER CLEAR DIP (MISCELLANEOUS) ×4 IMPLANT
SPONGE GAUZE 4X4 12PLY STER LF (GAUZE/BANDAGES/DRESSINGS) IMPLANT
SURGILUBE 2OZ TUBE FLIPTOP (MISCELLANEOUS) IMPLANT
SYR CONTROL 10ML LL (SYRINGE) ×4 IMPLANT
SYR TB 1ML LL NO SAFETY (SYRINGE) ×4 IMPLANT
TOWEL OR 17X24 6PK STRL BLUE (TOWEL DISPOSABLE) ×4 IMPLANT
TUBE CONNECTING 20'X1/4 (TUBING) ×1
TUBE CONNECTING 20X1/4 (TUBING) ×3 IMPLANT

## 2016-07-23 NOTE — Transfer of Care (Signed)
Immediate Anesthesia Transfer of Care Note  Patient: Ivan [name]  Procedure(s) Performed: Procedure(s): MICRO DIRECT LARYNGOSCOPY WITH BIOPSY (Bilateral)  Patient Location: PACU  Anesthesia Type:General  Level of Consciousness: awake  Airway & Oxygen Therapy: Patient Spontanous Breathing and Patient connected to face mask oxygen  Post-op Assessment: Report given to RN and Post -op Vital signs reviewed and stable  Post vital signs: Reviewed and stable  Last Vitals:  Vitals:   07/23/16 0920 07/23/16 0921  BP:  (!) 151/95  Pulse: 92 88  Resp: (!) 24 (!) 22  Temp:      Last Pain:  Vitals:   07/23/16 0809  TempSrc: Oral  PainSc: 3       Patients Stated Pain Goal: 3 ([id number])  Complications: No apparent anesthesia complications

## 2016-07-23 NOTE — H&P (Signed)
Cc: Hoarseness  HPI: The patient is a 70 y/o male who presents today with his wife for evaluation hoarseness. The patient is seen in consultation requested by Dr. [name]. The patient has noted intermittent hoarseness for the past 2 years, but his symptoms have gotten significantly worse over the past 6 months. The patient notes almost complete loss of his voice. He denies any throat pain or difficulty eating or drinking. No unexplained weight loss is noted. The patient is a 50+ pack year smoker. He was recently diagnosed with prostate cancer. No previous ENT surgery is noted.   The patient's review of systems (constitutional, eyes, ENT, cardiovascular, respiratory, GI, musculoskeletal, skin, neurologic, psychiatric, endocrine, hematologic, allergic) is noted in the ROS questionnaire.  It is reviewed with the patient.   Family health history: Heart disease .   Major events: back surgery and left kidney removed.   Ongoing medical problems: Arthritis, prostate cancer.   Social history: The patient is married. He smokes one pack of cigarettes a day. He denies the use of alcohol or illegal drugs.  Exam General: Communicates with difficulty, well nourished, no acute distress. Head: Normocephalic, no evidence injury, no tenderness, facial buttresses intact without stepoff. Eyes: PERRL, EOMI.  No scleral icterus, conjunctivae clear. Ears: External auditory canals clear bilaterally.  There is no edema or erythema.  Tympanic membrane is within normal limits bilaterally. Nose: Normal skin and external support.  Anterior rhinoscopy reveals healthy pink mucosa over the septum and turbinates.  No lesions or polyps were seen. Oral cavity: Lips without lesions, oral mucosa moist, no masses or lesions seen. Indirect  mirror laryngoscopy could not be tolerated. Pharynx: Clear, no erythema. Neck: Supple, full range of motion, no lymphadenopathy, no masses palpable. Salivary: Parotid and submandibular glands  without mass. Neuro:  CN 2-12 grossly intact. Gait normal. Vestibular: No nystagmus at any point of gaze.   Procedure:  Flexible Fiberoptic Laryngoscopy -- Risks, benefits, and alternatives of flexible endoscopy were explained to the patient.  Specific mention was made of the risk of throat numbness with difficulty swallowing, possible bleeding from the nose and mouth, and pain from the procedure.  The patient gave oral consent to proceed.  The nasal cavities were decongested and anesthetised with a combination of oxymetazoline and 4% lidocaine solution.  The flexible scope was inserted into the right nasal cavity and advanced towards the nasopharynx.  Visualized mucosa over the turbinates and septum were normal.  The nasopharynx was clear.  Oropharyngeal walls were symmetric and mobile without lesion, mass, or edema.  Hypopharynx was also without  lesion or edema.  Larynx was mobile without lesions.  No lesions or asymmetry in the supraglottic larynx.  Arytenoid mucosa was edematous with slight erythema.  True vocal folds were pale yellow and edematous with lesions along the anterior commissure and bilateral vocal cords, worse on the left.  Base of tongue was within normal limits. The patient tolerated the procedure well.   Assessment 1.  Anterior commissure and bilateral vocal cord lesions are noted on today's laryngoscopy evaluation. The findings are concerning for malignancy.  Plan 1.  Laryngoscopy findings are reviewed with the patient and his wife. 2.  Recommend direct laryngoscopy with biopsy ASAP. The risks, benefits, alternatives, and details of the procedure are reviewed with the patient. Questions are invited and answered. 3.  The procedure will be scheduled in accordance with the patient's schedule.

## 2016-07-23 NOTE — Discharge Instructions (Addendum)
The patient may resume all his previous activities and diet. He will follow-up in my office in one week.  Call your surgeon if you experience:   1.  Fever over 101.0. 2. Respiratory difficulty. 3.  Inability to urinate. 4.  Nausea and/or vomiting. 5.  Extreme swelling or bruising at the surgical site. 6.  Continued bleeding from the incision. 7.  Increased pain, redness or drainage from the incision. 8.  Problems related to your pain medication. 9.  Any problems and/or concerns   Post Anesthesia Home Care Instructions  Activity: Get plenty of rest for the remainder of the day. A responsible adult should stay with you for 24 hours following the procedure.  For the next 24 hours, DO NOT: -Drive a car -Paediatric nurse -Drink alcoholic beverages -Take any medication unless instructed by your physician -Make any legal decisions or sign important papers.  Meals: Start with liquid foods such as gelatin or soup. Progress to regular foods as tolerated. Avoid greasy, spicy, heavy foods. If nausea and/or vomiting occur, drink only clear liquids until the nausea and/or vomiting subsides. Call your physician if vomiting continues.  Special Instructions/Symptoms: Your throat may feel dry or sore from the anesthesia or the breathing tube placed in your throat during surgery. If this causes discomfort, gargle with warm salt water. The discomfort should disappear within 24 hours.  If you had a scopolamine patch placed behind your ear for the management of post- operative nausea and/or vomiting:  1. The medication in the patch is effective for 72 hours, after which it should be removed.  Wrap patch in a tissue and discard in the trash. Wash hands thoroughly with soap and water. 2. You may remove the patch earlier than 72 hours if you experience unpleasant side effects which may include dry mouth, dizziness or visual disturbances. 3. Avoid touching the patch. Wash your hands with soap and water  after contact with the patch.

## 2016-07-23 NOTE — Op Note (Signed)
DATE OF PROCEDURE:  07/23/2016                              OPERATIVE REPORT  SURGEON:  Leta Baptist, MD  PREOPERATIVE DIAGNOSES: 1. Hoarseness 2. Bilateral vocal cord lesions  POSTOPERATIVE DIAGNOSES: 1. Hoarseness 2. Bilateral vocal cord lesions  PROCEDURE PERFORMED:  Mircrodirect laryngoscopy and biopsy  ANESTHESIA:  General endotracheal tube anesthesia.  COMPLICATIONS:  None.  ESTIMATED BLOOD LOSS:  Minimal.  INDICATION FOR PROCEDURE:  Ivan [name] is a 70 y.o. male with a history of progressive hoarseness for the past 2 years. The patient is a 50+ pack year smoker. On examination, the patient was noted to have anterior commissure and bilateral vocal cord lesions. The lesion was worse on the left side. The findings were concerning for malignancy. Based on the above findings, the decision was made for the patient to undergo the above-stated procedure. The risks, benefits, alternatives, and details of the procedure were discussed with the patient.  Questions were invited and answered.  Informed consent was obtained.  DESCRIPTION:  The patient was taken to the operating room and placed supine on the operating table.  General endotracheal tube anesthesia was administered by the anesthesiologist.  The patient was positioned and prepped and draped in a standard fashion for direct laryngoscopy.   A Dedo laryngoscope was used for examination. The laryngoscope was advanced through the oral cavity into the pharynx. The epiglottis, aryepiglottic folds, vallecula, piriform sinuses were all noted to be normal. Examination of the vocal cords revealed fungating lesions involving the anterior commissure and bilateral anterior vocal cords. The involvement was worse on the left side. Photodocumentation of the findings was obtained. The Dedo laryngoscope was suspended with the Lewy suspender. A microscope was brought into the field. Under the operating microscope, multiple biopsy specimens were obtained.  The specimens were sent to the pathology department for permanent histologic identification. Hemostasis was achieved with pledgets soaked with epinephrine.  The care of the patient was turned over to the anesthesiologist.  The patient was awakened from anesthesia without difficulty.  The patient was extubated and transferred to the recovery room in good condition.  OPERATIVE FINDINGS:  Fungating lesions involving the anterior commissure and bilateral anterior vocal cords. The involvement was worse on the left side. 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  SPECIMEN:  Vocal cord biopsy specimens.  FOLLOWUP CARE:  The patient will be discharged home once awake and alert.  The patient will follow up in my office in approximately 1 week.  [name] 07/23/2016 9:14 AM

## 2016-07-23 NOTE — Anesthesia Postprocedure Evaluation (Signed)
Anesthesia Post Note  Patient: Ivan [name]  Procedure(s) Performed: Procedure(s) (LRB): MICRO DIRECT LARYNGOSCOPY WITH BIOPSY (Bilateral)  Patient location during evaluation: PACU Anesthesia Type: General Level of consciousness: awake Pain management: pain level controlled Vital Signs Assessment: post-procedure vital signs reviewed and stable Respiratory status: spontaneous breathing Cardiovascular status: stable Anesthetic complications: no    Last Vitals:  Vitals:   07/23/16 0930 07/23/16 0945  BP: (!) 170/100 (!) 160/91  Pulse: 81 73  Resp: (!) 21 (!) 21  Temp:      Last Pain:  Vitals:   07/23/16 0930  TempSrc:   PainSc: 6                  EDWARDS,Jairo Bellew

## 2016-07-23 NOTE — Anesthesia Preprocedure Evaluation (Signed)
Anesthesia Evaluation  Patient identified by MRN, date of birth, ID band Patient awake    Reviewed: Allergy & Precautions, NPO status , Patient's Chart, lab work & pertinent test results  Airway Mallampati: II  TM Distance: >3 FB     Dental   Pulmonary COPD, Current Smoker,    breath sounds clear to auscultation       Cardiovascular + Past MI   Rhythm:Regular Rate:Normal     Neuro/Psych    GI/Hepatic negative GI ROS, Neg liver ROS,   Endo/Other    Renal/GU Renal disease     Musculoskeletal  (+) Arthritis ,   Abdominal   Peds  Hematology   Anesthesia Other Findings   Reproductive/Obstetrics                             Anesthesia Physical Anesthesia Plan  ASA: III  Anesthesia Plan: General   Post-op Pain Management:    Induction: Intravenous  Airway Management Planned: Oral ETT  Additional Equipment:   Intra-op Plan:   Post-operative Plan: Extubation in OR  Informed Consent: I have reviewed the patients History and Physical, chart, labs and discussed the procedure including the risks, benefits and alternatives for the proposed anesthesia with the patient or authorized representative who has indicated his/her understanding and acceptance.   Dental advisory given  Plan Discussed with: CRNA, Anesthesiologist and Surgeon  Anesthesia Plan Comments:         Anesthesia Quick Evaluation

## 2016-07-23 NOTE — Anesthesia Procedure Notes (Signed)
Procedure Name: Intubation Date/Time: 07/23/2016 8:53 AM Performed by: Lieutenant Diego Pre-anesthesia Checklist: Patient identified, Emergency Drugs available, Suction available and Patient being monitored Patient Re-evaluated:Patient Re-evaluated prior to inductionOxygen Delivery Method: Circle system utilized Preoxygenation: Pre-oxygenation with 100% oxygen Intubation Type: IV induction Ventilation: Mask ventilation without difficulty Laryngoscope Size: Miller and 2 Grade View: Grade I Tube type: Oral Tube size: 6.5 mm Number of attempts: 1 Airway Equipment and Method: Stylet and Oral airway Placement Confirmation: ETT inserted through vocal cords under direct vision,  positive ETCO2 and breath sounds checked- equal and bilateral Secured at: 24 cm Tube secured with: Tape Dental Injury: Teeth and Oropharynx as per pre-operative assessment

## 2016-07-24 ENCOUNTER — Encounter (HOSPITAL_BASED_OUTPATIENT_CLINIC_OR_DEPARTMENT_OTHER): Payer: Self-pay | Admitting: Otolaryngology

## 2016-07-25 ENCOUNTER — Encounter (HOSPITAL_COMMUNITY): Payer: Self-pay | Admitting: Oncology

## 2016-07-25 ENCOUNTER — Other Ambulatory Visit (HOSPITAL_COMMUNITY): Payer: Self-pay | Admitting: Oncology

## 2016-07-25 DIAGNOSIS — C320 Malignant neoplasm of glottis: Secondary | ICD-10-CM | POA: Insufficient documentation

## 2016-07-25 HISTORY — DX: Malignant neoplasm of glottis: C32.0

## 2016-07-29 ENCOUNTER — Ambulatory Visit (INDEPENDENT_AMBULATORY_CARE_PROVIDER_SITE_OTHER): Payer: Medicare Other | Admitting: Otolaryngology

## 2016-07-29 DIAGNOSIS — C320 Malignant neoplasm of glottis: Secondary | ICD-10-CM | POA: Diagnosis not present

## 2016-07-31 ENCOUNTER — Encounter: Payer: Self-pay | Admitting: Radiation Oncology

## 2016-07-31 NOTE — Progress Notes (Signed)
FMLA paperwork sent to [facility] to [place] to fill out patient was seen in [place], [place]  Received 10/4

## 2016-08-01 ENCOUNTER — Other Ambulatory Visit: Payer: Self-pay

## 2016-08-01 DIAGNOSIS — C61 Malignant neoplasm of prostate: Secondary | ICD-10-CM

## 2016-08-05 ENCOUNTER — Ambulatory Visit (HOSPITAL_COMMUNITY)
Admission: RE | Admit: 2016-08-05 | Discharge: 2016-08-05 | Disposition: A | Discharge status: Home / Self Care | Payer: Medicare Other | Source: Ambulatory Visit | Attending: Oncology | Admitting: Oncology

## 2016-08-05 DIAGNOSIS — R938 Abnormal findings on diagnostic imaging of other specified body structures: Secondary | ICD-10-CM | POA: Insufficient documentation

## 2016-08-05 DIAGNOSIS — I2510 Atherosclerotic heart disease of native coronary artery without angina pectoris: Secondary | ICD-10-CM | POA: Diagnosis not present

## 2016-08-05 DIAGNOSIS — I700 Atherosclerosis of aorta: Secondary | ICD-10-CM | POA: Diagnosis not present

## 2016-08-05 DIAGNOSIS — I517 Cardiomegaly: Secondary | ICD-10-CM | POA: Insufficient documentation

## 2016-08-05 DIAGNOSIS — R918 Other nonspecific abnormal finding of lung field: Secondary | ICD-10-CM | POA: Insufficient documentation

## 2016-08-05 DIAGNOSIS — R590 Localized enlarged lymph nodes: Secondary | ICD-10-CM | POA: Insufficient documentation

## 2016-08-05 DIAGNOSIS — C320 Malignant neoplasm of glottis: Secondary | ICD-10-CM

## 2016-08-05 MED ORDER — IOPAMIDOL (ISOVUE-300) INJECTION 61%
100.0000 mL | Freq: Once | INTRAVENOUS | Status: AC | PRN
  Administered 2016-08-05: 100 mL via INTRAVENOUS

## 2016-08-06 ENCOUNTER — Encounter (HOSPITAL_COMMUNITY): Payer: Self-pay | Admitting: Hematology & Oncology

## 2016-08-06 ENCOUNTER — Encounter (HOSPITAL_COMMUNITY): Payer: Medicare Other | Attending: Hematology & Oncology | Admitting: Hematology & Oncology

## 2016-08-06 VITALS — BP 147/79 | HR 84 | Temp 97.7°F | Resp 18 | Ht 68.5 in | Wt 158.1 lb

## 2016-08-06 DIAGNOSIS — R918 Other nonspecific abnormal finding of lung field: Secondary | ICD-10-CM | POA: Diagnosis not present

## 2016-08-06 DIAGNOSIS — Z720 Tobacco use: Secondary | ICD-10-CM | POA: Diagnosis not present

## 2016-08-06 DIAGNOSIS — D751 Secondary polycythemia: Secondary | ICD-10-CM | POA: Diagnosis not present

## 2016-08-06 DIAGNOSIS — C320 Malignant neoplasm of glottis: Secondary | ICD-10-CM

## 2016-08-06 DIAGNOSIS — C61 Malignant neoplasm of prostate: Secondary | ICD-10-CM

## 2016-08-06 NOTE — Patient Instructions (Addendum)
Ivan [name] at [facility] Discharge Instructions  RECOMMENDATIONS MADE BY THE CONSULTANT AND ANY TEST RESULTS WILL BE SENT TO YOUR REFERRING PHYSICIAN.  You saw Dr. Whitney Muse today. Referral to [facility] radiation, they will call you with this appt. Return to see the doctor in 6 weeks.   Please call the clinic if you have any questions or concerns.   Thank you for choosing [place] at [facility] to provide your oncology and hematology care.  To afford each patient quality time with our provider, please arrive at least 15 minutes before your scheduled appointment time.   Beginning January 23rd 2017 lab work for the [facility] will be done in the  Main lab at [facility] on 1st floor. If you have a lab appointment with the [facility] please come in thru the  Main Entrance and check in at the main information desk  You need to re-schedule your appointment should you arrive 10 or more minutes late.  We strive to give you quality time with our providers, and arriving late affects you and other patients whose appointments are after yours.  Also, if you no show three or more times for appointments you may be dismissed from the clinic at the providers discretion.     Again, thank you for choosing [facility].  Our hope is that these requests will decrease the amount of time that you wait before being seen by our physicians.       _____________________________________________________________  Should you have questions after your visit to [facility], please contact our office at [phone] between the hours of 8:30 a.m. and 4:30 p.m.  Voicemails left after 4:30 p.m. will not be returned until the following business day.  For prescription refill requests, have your pharmacy contact our office.         Resources For Cancer Patients and their Caregivers ? American Cancer Society: Can assist with transportation, wigs,  general needs, runs Look Good Feel Better.        878-808-0183 ? Cancer Care: Provides financial assistance, online support groups, medication/co-pay assistance.  1-800-813-HOPE 310-535-5112) ? [employer] Assists [place] cancer patients and their families through emotional , educational and financial support.  715 445 3460 ? [place] DSS Where to apply for food stamps, Medicaid and utility assistance. 7055658127 ? [employer]: Transportation to medical appointments. 215-440-7792 ? Social Security Administration: May apply for disability if have a Stage IV cancer. (407) 093-9952 559-695-4194 ? [employer], Disability and Transit Services: Assists with nutrition, care and transit needs. [place] Support Programs: @10RELATIVEDAYS @ > Cancer Support Group  2nd Tuesday of the month 1pm-2pm, Journey Room  > Creative Journey  3rd Tuesday of the month 1130am-1pm, Journey Room  > Look Good Feel Better  1st Wednesday of the month 10am-12 noon, Journey Room (Call [place] to register [phone])

## 2016-08-06 NOTE — Progress Notes (Signed)
Met with pt face to face.  Introduced myself and explain a little bit about my role as the patient navigator.  Pt given a card with all my information on it.  Told pt to call if they had any questions or concerns.  Pt verbalized understanding.

## 2016-08-06 NOTE — Progress Notes (Signed)
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 There is probable supraglottic extension along the anterior false cords and suspected laryngeal cartilage erosion anteriorly at the level of of the commissure. No extension beyond the larynx, extension into the subglottic airway, or lymphadenopathy is identified.      08/06/2016 Imaging    CT chest- 1. Asymmetric interstitial changes involving the right lobe suggestive of interstitial lung disease. 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 A followup high-resolution CT of the chest is recommended in 6 months to assess for temporal change and provide a more detailed assessment of interstitial changes. 2. Enlarged mediastinal and right hilar lymph nodes. Nodes measure up to 1.5 cm which is considered pathologically enlarged by size criteria. In the setting of interstitial lung disease this is a nonspecific finding and may be reactive. 3. Aortic atherosclerosis, cardiac enlargement and multi vessel coronary artery calcification 4. Small pulmonary nodules are identified in the right lung. The largest measures 6 mm. Attention at follow-up imaging advised.       HISTORY OF PRESENTING ILLNESS:  Ivan [name] 70 y.o. male is here for a consult due to squamous cell carcinoma of the  L vocal cord.  Referred by Dr. Benjamine Mola.  Patient was first evaluated by Dr. Luan Pulling for hoarseness, but significantly worse over the past six months. Dr. Luan Pulling then referred the patient to Dr. Benjamine Mola.   Mr. Mach reports almost complete loss of voice. He denies throat pain, dysphagia, or weight loss. Patient is a smoker and has smoked a pack of cigarettes a day for over 50 years. He was recently diagnosed with prostate cancer. He is seeing [facility]. He goes October 24th or 25th for "seed placement" and will start radiation in mid-November in [place].    He is here today for further evaluation.  He is accompanied by his wife and daughter.   In the last three years, he has had his back operated on, lost a kidney, and sepsis from UTI. He notes that his major problem revolves around chronic back pain. This limits his ADL's and functional status.   He has questions regarding his recent CT scans.  He denies weight loss.  MEDICAL HISTORY:  Past Medical History:  Diagnosis Date  . Arthritis   . Chronic back pain   . COPD (chronic obstructive pulmonary disease) ([place])   . Hypercalcemia   . Kidney stones   . Myocardial infarction   . Sepsis ([place])   . 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 UTI (lower urinary tract infection)     SURGICAL HISTORY: Past Surgical History:  Procedure Laterality Date  . APPENDECTOMY    . BACK SURGERY     10/2013  . HERNIA REPAIR    . MICROLARYNGOSCOPY Bilateral 07/23/2016   Procedure: MICRO DIRECT LARYNGOSCOPY WITH BIOPSY;  Surgeon: Leta Baptist, MD;  Location: [facility];  Service: ENT;  Laterality: Bilateral;  . NEPHRECTOMY Left    04/2014  . PROSTATE BIOPSY N/A 06/05/2016   Procedure: PROSTATE BIOPSY;  Surgeon: Cleon Gustin, MD;  Location: [facility];  Service: Urology;  Laterality: N/A;    SOCIAL HISTORY: Social History   Social History  . Marital status: Married    Spouse name: N/A  . Number of children: N/A  . Years of  education: N/A   Occupational History  . Not on file.   Social History Main Topics  . Smoking status: Current Every Day Smoker    Packs/day: 1.00    Years: 50.00    Types: Cigarettes  . Smokeless tobacco: Never Used  . Alcohol use No  . Drug use: No  . Sexual activity: Yes   Other Topics Concern  . Not on file   Social History Narrative  . No narrative on file  Married 35 years. One child from another wife. One grandson. One dog and one horse. Used to work as a Tax inspector work. No longer drinks alcohol  FAMILY HISTORY: Family History  Problem Relation Age of Onset  . Cancer Mother   . Cancer Brother   Mom passed 35 from aneurysm Dad passed 64 from heart failure Two brothers living One brother passed from kidney cancer Fishing/horse back riding are his hobbies, no longer able to do secondary to back disability  ALLERGIES:  has No Known Allergies.  MEDICATIONS:  Current Outpatient Prescriptions  Medication Sig Dispense Refill  . ALPRAZolam (XANAX) 1 MG tablet Take 1 mg by mouth at bedtime as needed for sleep.   5  . Multiple Vitamin (MULTIVITAMIN WITH MINERALS) TABS tablet Take 1 tablet by mouth daily.    . nitrofurantoin, macrocrystal-monohydrate, (MACROBID) 100 MG capsule Take 1 capsule (100 mg total) by mouth 2 (two) times daily. (Patient taking differently: Take 100 mg by mouth at bedtime. ) 30 capsule 12  . ondansetron (ZOFRAN-ODT) 4 MG disintegrating tablet Take 1 tablet by mouth every 8 (eight) hours as needed for nausea or vomiting.   0  . Oxycodone HCl 10 MG TABS Take 1 tablet by mouth 2 (two) times daily as needed (pain).   0  . polyethylene glycol (MIRALAX / GLYCOLAX) packet Take 17 g by mouth daily as needed.    . Potassium 95 MG TABS Take 1 tablet by mouth daily.     No current facility-administered medications for this visit.     Review of Systems  Constitutional: Negative.   HENT: Negative.        Hoarseness  Eyes: Negative.     Respiratory: Negative.   Cardiovascular: Negative.   Gastrointestinal: Negative.   Genitourinary: Negative.   Musculoskeletal: Positive for back pain.       Chronic back pain  Skin: Negative.   Neurological: Negative.   Endo/Heme/Allergies: Negative.   Psychiatric/Behavioral: Negative.   All other systems reviewed and are negative.  14 point ROS was done and is otherwise as detailed above or in HPI   PHYSICAL EXAMINATION: ECOG PERFORMANCE STATUS: 1 - Symptomatic but completely ambulatory  Vitals:   08/06/16 1529  BP: (!) 147/79  Pulse: 84  Resp: 18  Temp: 97.7 F (36.5 C)   Filed Weights   08/06/16 1529  Weight: 158 lb 1.6 oz (71.7 kg)     Physical Exam  Constitutional: He is oriented to person, place, and time and well-developed, well-nourished,  and in no distress.  Hoarse voice  HENT:  Head: Normocephalic and atraumatic.  Nose: Nose normal.  Mouth/Throat: Oropharynx is clear and moist. No oropharyngeal exudate.  Eyes: Conjunctivae and EOM are normal. Pupils are equal, round, and reactive to light. Right eye exhibits no discharge. Left eye exhibits no discharge. No scleral icterus.  Neck: Normal range of motion. Neck supple. No tracheal deviation present. No thyromegaly present.  Cardiovascular: Normal rate, regular rhythm and normal heart sounds.  Exam reveals no gallop and no friction rub.   No murmur heard. Pulmonary/Chest: Effort normal. He has no wheezes. He has no rales.  Decreased BS throughout  Abdominal: Soft. Bowel sounds are normal. He exhibits no distension and no mass. There is no tenderness. There is no rebound and no guarding.  Musculoskeletal: Normal range of motion. He exhibits no edema.  Lymphadenopathy:    He has no cervical adenopathy.  Neurological: He is alert and oriented to person, place, and time. No cranial nerve deficit. Coordination normal.  Skin: Skin is warm and dry. No rash noted.  Psychiatric: Mood, memory, affect and judgment  normal.  Nursing note and vitals reviewed.   LABORATORY DATA:  I have reviewed the data as listed Lab Results  Component Value Date   WBC 9.0 05/30/2016   HGB 18.0 (H) 07/23/2016   HCT 53.0 (H) 07/23/2016   MCV 90.8 05/30/2016   PLT 116 (L) 05/30/2016   CMP     Component Value Date/Time   NA 140 07/23/2016 0804   K 4.9 07/23/2016 0804   CL 106 07/23/2016 0804   CO2 25 05/30/2016 1300   GLUCOSE 98 07/23/2016 0804   BUN 27 (H) 07/23/2016 0804   CREATININE 0.90 07/23/2016 0804   CALCIUM 10.8 (H) 05/30/2016 1300   CALCIUM 9.7 09/16/2014 0347   PROT 6.0 09/18/2014 0530   ALBUMIN 2.3 (L) 09/18/2014 0530   AST 17 09/18/2014 0530   ALT 17 09/18/2014 0530   ALKPHOS 114 09/18/2014 0530   BILITOT 1.5 (H) 09/18/2014 0530   GFRNONAA 54 (L) 05/30/2016 1300   GFRAA >60 05/30/2016 1300     RADIOGRAPHIC STUDIES: I have personally reviewed the radiological images as listed and agreed with the findings in the report. CLINICAL DATA:  Squamous cell carcinoma of the left vocal cord  EXAM: CT CHEST WITH CONTRAST  TECHNIQUE: Multidetector CT imaging of the chest was performed during intravenous contrast administration.  CONTRAST:  189m ISOVUE-300 IOPAMIDOL (ISOVUE-300) INJECTION 61%  COMPARISON:  None.  FINDINGS: Cardiovascular: The heart size is moderately enlarged. Aortic atherosclerosis noted. Calcifications within the LAD and RCA coronary artery noted. No pericardial effusion.  Mediastinum/Nodes: The trachea is patent and midline. Normal appearance of the esophagus. Sub- carinal node measures 1.3 cm, image 78 of series 2. Right paratracheal lymph node measures 1 cm, image 56 of series 2. Within the right hilar region there is a 1.5 cm lymph node.  Lungs/Pleura: No pleural effusion. Peripheral interstitial reticulation is identified within the right lung. Patchy areas of ground-glass attenuation appear basilar predominant within the right lung. There is mild  bronchiectasis and bronchiolectasis noted. 5 mm right middle lobe pulmonary nodule is identified, image 101 of series 3. Within the right lower lobe there is a 6 mm lung nodule, image 91 of series 3.  Upper Abdomen: No acute abnormality.  Musculoskeletal: No aggressive lytic or sclerotic bone lesion identified.  IMPRESSION: 1. Asymmetric interstitial changes involving the right lobe suggestive of interstitial lung disease. Findings are suggestive of  nonspecific interstitial pneumonitis (NSIP). A followup high-resolution CT of the chest is recommended in 6 months to assess for temporal change and provide a more detailed assessment of interstitial changes. 2. Enlarged mediastinal and right hilar lymph nodes. Nodes measure up to 1.5 cm which is considered pathologically enlarged by size criteria. In the setting of interstitial lung disease this is a nonspecific finding and may be reactive. 3. Aortic atherosclerosis, cardiac enlargement and multi vessel coronary artery calcification 4. Small pulmonary nodules are identified in the right lung. The largest measures 6 mm. Attention at follow-up imaging advised.   Electronically Signed   By: Kerby Moors M.D.   On: 08/06/2016 08:53  CLINICAL DATA:  70 y/o M; squamous cell carcinoma of left vocal cord for staging.  EXAM: CT NECK WITH CONTRAST  TECHNIQUE: Multidetector CT imaging of the neck was performed using the standard protocol following the bolus administration of intravenous contrast.  CONTRAST:  151m ISOVUE-300 IOPAMIDOL (ISOVUE-300) INJECTION 61%  COMPARISON:  None.  FINDINGS: Pharynx and larynx: Irregular enhancement centered within the anterior commissure measuring approximately 15 x 12 x 15 mm (AP x ML x CC), series 7, image 71 and series 9, image 52. On sagittal reconstruction enhancement appears to extend to the anterior supraglottis near the petiole of epiglottis. No subglottic  extension identified. There is suspected erosion of the laryngeal cartilage anteriorly at the level of the commissure (series 7, image 71). No extension into the soft tissues B on the larynx is identified. No definite effacement of paraglottic fat at the level of false cords.  Salivary glands: No inflammation, mass, or stone.  Thyroid: Normal.  Lymph nodes: None enlarged or abnormal density.  Vascular: Predominantly fibro fatty plaque of carotid bifurcations with small plaque ulcerations and mild less than 50% stenosis bilaterally. Vertebral arteries are patent. Internal jugular veins are patent.  Limited intracranial: Negative.  Visualized orbits: Negative.  Mastoids and visualized paranasal sinuses: Clear.  Skeleton: Mild cervical spondylosis greatest at the C3-4 and C6-7 levels. No high-grade canal stenosis. Multilevel facet arthropathy. No destructive osseous lesion is identified.  Upper chest: Please refer to the concurrent CT of the chest for further evaluation of lungs and mediastinum.  Other: No additional findings in the neck.  IMPRESSION: Irregular enhancement centered within the anterior commissure of the glottis consistent with history of squamous cell carcinoma. There is probable supraglottic extension along the anterior false cords and suspected laryngeal cartilage erosion anteriorly at the level of of the commissure. No extension beyond the larynx, extension into the subglottic airway, or lymphadenopathy is identified.   Electronically Signed   By: LKristine GarbeM.D.   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 Results are noted above. I suspect findings on CT chest are "benign" but will either repeat CT chest in 3 to 6 months or refer to pulmonary for ongoing follow-up.  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  I addressed  the importance of smoking cessation with the patient in detail.  We discussed the health benefits of cessation.  We discussed the health detriments of ongoing tobacco use including but not limited to COPD, heart disease and malignancy. We reviewed the multiple options for cessation and I offered to refer him to smoking cessation classes. We discussed other alternatives to quit such as chantix, wellbutrin. We will continue to address this moving forward.  Based upon imaging and clinical exam he has a stage II vocal cord carcinoma.  We discussed this in detail.  I will refer him to [facility] for XRT. No chemotherapy is recommended at this time. He is currently scheduled to undergo therapy for newly diagnosed prostate carcinoma. I advised the patient and his family that timing of treating both malignancies will have to be worked out with Radiation and his Urologist, Dr. Alyson Ingles.   RTC in one month for additional evaluation of polycythemia and ongoing follow-up. Patient and family met with our navigator, Anderson Malta today.  All questions were answered. The patient knows to call the clinic with any problems, questions or concerns.  This document serves as a record of services personally performed by Ancil Linsey, MD. It was created on her behalf by Elmyra Ricks, a trained medical scribe. The creation of this record is based on the scribe's personal observations and the provider's statements to them. This document has been checked and approved by the attending  provider.  I have reviewed the above documentation for accuracy and completeness and I agree with the above.  This note was electronically signed.    Molli Hazard, MD  08/11/2016 5:14 PM

## 2016-08-08 DIAGNOSIS — E785 Hyperlipidemia, unspecified: Secondary | ICD-10-CM | POA: Diagnosis not present

## 2016-08-08 DIAGNOSIS — C320 Malignant neoplasm of glottis: Secondary | ICD-10-CM | POA: Diagnosis not present

## 2016-08-08 DIAGNOSIS — C61 Malignant neoplasm of prostate: Secondary | ICD-10-CM | POA: Diagnosis not present

## 2016-08-08 DIAGNOSIS — F17200 Nicotine dependence, unspecified, uncomplicated: Secondary | ICD-10-CM | POA: Diagnosis not present

## 2016-08-08 DIAGNOSIS — Z79899 Other long term (current) drug therapy: Secondary | ICD-10-CM | POA: Diagnosis not present

## 2016-08-08 DIAGNOSIS — Z510 Encounter for antineoplastic radiation therapy: Secondary | ICD-10-CM | POA: Diagnosis not present

## 2016-08-08 DIAGNOSIS — N181 Chronic kidney disease, stage 1: Secondary | ICD-10-CM | POA: Diagnosis not present

## 2016-08-08 DIAGNOSIS — Z8051 Family history of malignant neoplasm of kidney: Secondary | ICD-10-CM | POA: Diagnosis not present

## 2016-08-08 DIAGNOSIS — C321 Malignant neoplasm of supraglottis: Secondary | ICD-10-CM | POA: Diagnosis not present

## 2016-08-11 ENCOUNTER — Encounter (HOSPITAL_COMMUNITY): Payer: Self-pay | Admitting: Hematology & Oncology

## 2016-08-11 DIAGNOSIS — Z720 Tobacco use: Secondary | ICD-10-CM | POA: Insufficient documentation

## 2016-08-11 DIAGNOSIS — D751 Secondary polycythemia: Secondary | ICD-10-CM

## 2016-08-11 DIAGNOSIS — C61 Malignant neoplasm of prostate: Secondary | ICD-10-CM | POA: Insufficient documentation

## 2016-08-11 HISTORY — DX: Secondary polycythemia: D75.1

## 2016-08-11 HISTORY — DX: Malignant neoplasm of prostate: C61

## 2016-08-13 DIAGNOSIS — Z510 Encounter for antineoplastic radiation therapy: Secondary | ICD-10-CM | POA: Diagnosis not present

## 2016-08-13 DIAGNOSIS — N181 Chronic kidney disease, stage 1: Secondary | ICD-10-CM | POA: Diagnosis not present

## 2016-08-13 DIAGNOSIS — E785 Hyperlipidemia, unspecified: Secondary | ICD-10-CM | POA: Diagnosis not present

## 2016-08-13 DIAGNOSIS — C61 Malignant neoplasm of prostate: Secondary | ICD-10-CM | POA: Diagnosis not present

## 2016-08-13 DIAGNOSIS — C320 Malignant neoplasm of glottis: Secondary | ICD-10-CM | POA: Diagnosis not present

## 2016-08-13 DIAGNOSIS — Z8051 Family history of malignant neoplasm of kidney: Secondary | ICD-10-CM | POA: Diagnosis not present

## 2016-08-15 DIAGNOSIS — C61 Malignant neoplasm of prostate: Secondary | ICD-10-CM | POA: Diagnosis not present

## 2016-08-15 DIAGNOSIS — C320 Malignant neoplasm of glottis: Secondary | ICD-10-CM | POA: Diagnosis not present

## 2016-08-15 DIAGNOSIS — N181 Chronic kidney disease, stage 1: Secondary | ICD-10-CM | POA: Diagnosis not present

## 2016-08-15 DIAGNOSIS — Z510 Encounter for antineoplastic radiation therapy: Secondary | ICD-10-CM | POA: Diagnosis not present

## 2016-08-15 DIAGNOSIS — E785 Hyperlipidemia, unspecified: Secondary | ICD-10-CM | POA: Diagnosis not present

## 2016-08-15 DIAGNOSIS — Z8051 Family history of malignant neoplasm of kidney: Secondary | ICD-10-CM | POA: Diagnosis not present

## 2016-08-15 NOTE — Patient Instructions (Signed)
Your procedure is scheduled on: 08/21/2016  Report to [facility] at 11:35    AM.  Call this number if you have problems the morning of surgery: [phone]   Remember:   Do not drink or eat food:After Midnight.  :  Take these medicines the morning of surgery with A SIP OF WATER: xanax and zofran   Do not wear jewelry, make-up or nail polish.  Do not wear lotions, powders, or perfumes. You may wear deodorant.  Do not shave 48 hours prior to surgery. Men may shave face and neck.  Do not bring valuables to the hospital.  Contacts, dentures or bridgework may not be worn into surgery.  Leave suitcase in the car. After surgery it may be brought to your room.  For patients admitted to the hospital, checkout time is 11:00 AM the day of discharge.   Patients discharged the day of surgery will not be allowed to drive home.    Special Instructions: Shower using CHG night before surgery and shower the day of surgery use CHG.  Use special wash - you have one bottle of CHG for all showers.  You should use approximately 1/2 of the bottle for each shower.  Prostate Cancer The prostate is a male gland that helps produce semen. Prostate cancer is the abnormal growth of cells in this gland. HOME CARE  Only take medicines as told by your doctor.  Eat a healthy diet.  Get plenty of sleep.  Consider joining a support group.  Seek advice to help you manage treatment side effects.  Keep all follow-up visits as told by your doctor.  Tell your cancer specialist if you are admitted to the hospital.  Touch, hold, hug, and caress your partner to continue to show sexual feelings. GET HELP IF:  You have trouble peeing (urinating).  You have blood in your pee.  You have trouble having an erection.  You have pain in your hips, back, or chest. GET HELP RIGHT AWAY IF:  You have weakness or loss of feeling (numbness) in your legs.  You have accidental loss of pee or poop (stool).   This  information is not intended to replace advice given to you by your health care provider. Make sure you discuss any questions you have with your health care provider.   Document Released: 10/02/2009 Document Revised: 11/04/2014 Document Reviewed: 04/02/2013 Elsevier Interactive Patient Education 2016 [place]. Anesthesia, Adult, Care After Refer to this sheet in the next few weeks. These instructions provide you with information on caring for yourself after your procedure. Your health care provider may also give you more specific instructions. Your treatment has been planned according to current medical practices, but problems sometimes occur. Call your health care provider if you have any problems or questions after your procedure. WHAT TO EXPECT AFTER THE PROCEDURE After the procedure, it is typical to experience:  Sleepiness.  Nausea and vomiting. HOME CARE INSTRUCTIONS  For the first 24 hours after general anesthesia:  Have a responsible person with you.  Do not drive a car. If you are alone, do not take public transportation.  Do not drink alcohol.  Do not take medicine that has not been prescribed by your health care provider.  Do not sign important papers or make important decisions.  You may resume a normal diet and activities as directed by your health care provider.  Change bandages (dressings) as directed.  If you have questions or problems that seem related to general anesthesia,  call the hospital and ask for the anesthetist or anesthesiologist on call. SEEK MEDICAL CARE IF:  You have nausea and vomiting that continue the day after anesthesia.  You develop a rash. SEEK IMMEDIATE MEDICAL CARE IF:   You have difficulty breathing.  You have chest pain.  You have any allergic problems.   This information is not intended to replace advice given to you by your health care provider. Make sure you discuss any questions you have with your health care provider.     Document Released: 01/20/2001 Document Revised: 11/04/2014 Document Reviewed: 02/12/2012 Elsevier Interactive Patient Education [employer].

## 2016-08-16 ENCOUNTER — Encounter (HOSPITAL_COMMUNITY): Payer: Self-pay

## 2016-08-16 ENCOUNTER — Encounter (HOSPITAL_COMMUNITY)
Admission: RE | Admit: 2016-08-16 | Discharge: 2016-08-16 | Disposition: A | Discharge status: Home / Self Care | Payer: Medicare Other | Source: Ambulatory Visit | Attending: Urology | Admitting: Urology

## 2016-08-16 DIAGNOSIS — Z8051 Family history of malignant neoplasm of kidney: Secondary | ICD-10-CM | POA: Diagnosis not present

## 2016-08-16 DIAGNOSIS — E785 Hyperlipidemia, unspecified: Secondary | ICD-10-CM | POA: Diagnosis not present

## 2016-08-16 DIAGNOSIS — C61 Malignant neoplasm of prostate: Secondary | ICD-10-CM | POA: Insufficient documentation

## 2016-08-16 DIAGNOSIS — C320 Malignant neoplasm of glottis: Secondary | ICD-10-CM | POA: Diagnosis not present

## 2016-08-16 DIAGNOSIS — Z510 Encounter for antineoplastic radiation therapy: Secondary | ICD-10-CM | POA: Diagnosis not present

## 2016-08-16 DIAGNOSIS — N181 Chronic kidney disease, stage 1: Secondary | ICD-10-CM | POA: Diagnosis not present

## 2016-08-16 LAB — CBC WITH DIFFERENTIAL/PLATELET
BASOS ABS: 0.1 10*3/uL (ref 0.0–0.1)
Basophils Relative: 1 %
Eosinophils Absolute: 0.3 10*3/uL (ref 0.0–0.7)
Eosinophils Relative: 4 %
HEMATOCRIT: 52.5 % — AB (ref 39.0–52.0)
Hemoglobin: 18.3 g/dL — ABNORMAL HIGH (ref 13.0–17.0)
LYMPHS ABS: 1.2 10*3/uL (ref 0.7–4.0)
Lymphocytes Relative: 16 %
MCH: 31 pg (ref 26.0–34.0)
MCHC: 34.9 g/dL (ref 30.0–36.0)
MCV: 89 fL (ref 78.0–100.0)
MONOS PCT: 7 %
Monocytes Absolute: 0.5 10*3/uL (ref 0.1–1.0)
NEUTROS PCT: 72 %
Neutro Abs: 5.2 10*3/uL (ref 1.7–7.7)
PLATELETS: 115 10*3/uL — AB (ref 150–400)
RBC: 5.9 MIL/uL — AB (ref 4.22–5.81)
RDW: 13 % (ref 11.5–15.5)
WBC: 7.3 10*3/uL (ref 4.0–10.5)

## 2016-08-16 LAB — BASIC METABOLIC PANEL
ANION GAP: 7 (ref 5–15)
BUN: 25 mg/dL — ABNORMAL HIGH (ref 6–20)
CALCIUM: 11.5 mg/dL — AB (ref 8.9–10.3)
CO2: 28 mmol/L (ref 22–32)
Chloride: 103 mmol/L (ref 101–111)
Creatinine, Ser: 1.27 mg/dL — ABNORMAL HIGH (ref 0.61–1.24)
GFR, EST NON AFRICAN AMERICAN: 56 mL/min — AB (ref >60)
Glucose, Bld: 113 mg/dL — ABNORMAL HIGH (ref 65–99)
Potassium: 4.5 mmol/L (ref 3.5–5.1)
Sodium: 138 mmol/L (ref 135–145)

## 2016-08-19 DIAGNOSIS — E785 Hyperlipidemia, unspecified: Secondary | ICD-10-CM | POA: Diagnosis not present

## 2016-08-19 DIAGNOSIS — Z510 Encounter for antineoplastic radiation therapy: Secondary | ICD-10-CM | POA: Diagnosis not present

## 2016-08-19 DIAGNOSIS — Z8051 Family history of malignant neoplasm of kidney: Secondary | ICD-10-CM | POA: Diagnosis not present

## 2016-08-19 DIAGNOSIS — N181 Chronic kidney disease, stage 1: Secondary | ICD-10-CM | POA: Diagnosis not present

## 2016-08-19 DIAGNOSIS — C320 Malignant neoplasm of glottis: Secondary | ICD-10-CM | POA: Diagnosis not present

## 2016-08-19 DIAGNOSIS — C61 Malignant neoplasm of prostate: Secondary | ICD-10-CM | POA: Diagnosis not present

## 2016-08-20 DIAGNOSIS — N181 Chronic kidney disease, stage 1: Secondary | ICD-10-CM | POA: Diagnosis not present

## 2016-08-20 DIAGNOSIS — Z8051 Family history of malignant neoplasm of kidney: Secondary | ICD-10-CM | POA: Diagnosis not present

## 2016-08-20 DIAGNOSIS — C61 Malignant neoplasm of prostate: Secondary | ICD-10-CM | POA: Diagnosis not present

## 2016-08-20 DIAGNOSIS — E785 Hyperlipidemia, unspecified: Secondary | ICD-10-CM | POA: Diagnosis not present

## 2016-08-20 DIAGNOSIS — Z510 Encounter for antineoplastic radiation therapy: Secondary | ICD-10-CM | POA: Diagnosis not present

## 2016-08-20 DIAGNOSIS — C320 Malignant neoplasm of glottis: Secondary | ICD-10-CM | POA: Diagnosis not present

## 2016-08-21 ENCOUNTER — Ambulatory Visit (HOSPITAL_COMMUNITY)
Admission: RE | Admit: 2016-08-21 | Discharge: 2016-08-21 | Disposition: A | Discharge status: Home / Self Care | Payer: Medicare Other | Source: Ambulatory Visit | Attending: Urology | Admitting: Urology

## 2016-08-21 ENCOUNTER — Ambulatory Visit (HOSPITAL_COMMUNITY): Payer: Medicare Other | Admitting: Anesthesiology

## 2016-08-21 ENCOUNTER — Encounter (HOSPITAL_COMMUNITY): Payer: Self-pay | Admitting: *Deleted

## 2016-08-21 ENCOUNTER — Encounter (HOSPITAL_COMMUNITY)
Admission: RE | Disposition: A | Discharge status: Home / Self Care | Payer: Self-pay | Source: Ambulatory Visit | Attending: Urology

## 2016-08-21 ENCOUNTER — Ambulatory Visit (HOSPITAL_COMMUNITY): Admission: RE | Admit: 2016-08-21 | Payer: Medicare Other | Source: Ambulatory Visit

## 2016-08-21 ENCOUNTER — Ambulatory Visit (INDEPENDENT_AMBULATORY_CARE_PROVIDER_SITE_OTHER): Payer: Medicare Other | Admitting: Urology

## 2016-08-21 DIAGNOSIS — Z905 Acquired absence of kidney: Secondary | ICD-10-CM | POA: Insufficient documentation

## 2016-08-21 DIAGNOSIS — C61 Malignant neoplasm of prostate: Secondary | ICD-10-CM | POA: Insufficient documentation

## 2016-08-21 DIAGNOSIS — Z510 Encounter for antineoplastic radiation therapy: Secondary | ICD-10-CM | POA: Diagnosis not present

## 2016-08-21 DIAGNOSIS — Z79899 Other long term (current) drug therapy: Secondary | ICD-10-CM | POA: Diagnosis not present

## 2016-08-21 DIAGNOSIS — E785 Hyperlipidemia, unspecified: Secondary | ICD-10-CM | POA: Diagnosis not present

## 2016-08-21 DIAGNOSIS — Z8521 Personal history of malignant neoplasm of larynx: Secondary | ICD-10-CM | POA: Diagnosis not present

## 2016-08-21 DIAGNOSIS — C320 Malignant neoplasm of glottis: Secondary | ICD-10-CM | POA: Diagnosis not present

## 2016-08-21 DIAGNOSIS — I252 Old myocardial infarction: Secondary | ICD-10-CM | POA: Diagnosis not present

## 2016-08-21 DIAGNOSIS — Z8051 Family history of malignant neoplasm of kidney: Secondary | ICD-10-CM | POA: Diagnosis not present

## 2016-08-21 DIAGNOSIS — F17210 Nicotine dependence, cigarettes, uncomplicated: Secondary | ICD-10-CM | POA: Diagnosis not present

## 2016-08-21 DIAGNOSIS — I2510 Atherosclerotic heart disease of native coronary artery without angina pectoris: Secondary | ICD-10-CM | POA: Insufficient documentation

## 2016-08-21 DIAGNOSIS — N181 Chronic kidney disease, stage 1: Secondary | ICD-10-CM | POA: Diagnosis not present

## 2016-08-21 DIAGNOSIS — J449 Chronic obstructive pulmonary disease, unspecified: Secondary | ICD-10-CM | POA: Insufficient documentation

## 2016-08-21 HISTORY — PX: GOLD SEED IMPLANT: SHX6343

## 2016-08-21 HISTORY — PX: TRANSRECTAL ULTRASOUND: SHX5146

## 2016-08-21 LAB — SURGICAL PCR SCREEN
MRSA, PCR: NEGATIVE
STAPHYLOCOCCUS AUREUS: NEGATIVE

## 2016-08-21 SURGERY — ULTRASOUND, RECTAL APPROACH
Anesthesia: Monitor Anesthesia Care | Site: Rectum

## 2016-08-21 MED ORDER — GENTAMICIN SULFATE 40 MG/ML IJ SOLN
350.0000 mg | Freq: Once | INTRAVENOUS | Status: AC
  Administered 2016-08-21: 350 mg via INTRAVENOUS
  Filled 2016-08-21: qty 8.75

## 2016-08-21 MED ORDER — PROPOFOL 500 MG/50ML IV EMUL
INTRAVENOUS | Status: DC | PRN
  Administered 2016-08-21: 75 ug/kg/min via INTRAVENOUS

## 2016-08-21 MED ORDER — LIDOCAINE HCL (PF) 1 % IJ SOLN
INTRAMUSCULAR | Status: AC
  Filled 2016-08-21: qty 5

## 2016-08-21 MED ORDER — LIDOCAINE HCL (CARDIAC) 10 MG/ML IV SOLN
INTRAVENOUS | Status: DC | PRN
  Administered 2016-08-21: 30 mg via INTRAVENOUS

## 2016-08-21 MED ORDER — CHLORHEXIDINE GLUCONATE CLOTH 2 % EX PADS: 6.0000 | MEDICATED_PAD | Freq: Once | CUTANEOUS | Status: DC

## 2016-08-21 MED ORDER — MIDAZOLAM HCL 2 MG/2ML IJ SOLN
1.0000 mg | INTRAMUSCULAR | Status: DC | PRN
  Administered 2016-08-21: 2 mg via INTRAVENOUS

## 2016-08-21 MED ORDER — FENTANYL CITRATE (PF) 100 MCG/2ML IJ SOLN
INTRAMUSCULAR | Status: AC
  Filled 2016-08-21: qty 2

## 2016-08-21 MED ORDER — LACTATED RINGERS IV SOLN
INTRAVENOUS | Status: DC
  Administered 2016-08-21: 1000 mL via INTRAVENOUS

## 2016-08-21 MED ORDER — HYDROMORPHONE HCL 1 MG/ML IJ SOLN: 0.2500 mg | INTRAMUSCULAR | Status: DC | PRN

## 2016-08-21 MED ORDER — MIDAZOLAM HCL 2 MG/2ML IJ SOLN
INTRAMUSCULAR | Status: AC
  Filled 2016-08-21: qty 2

## 2016-08-21 MED ORDER — GENTAMICIN SULFATE 40 MG/ML IJ SOLN
350.0000 mg | Freq: Once | INTRAMUSCULAR | Status: DC
  Filled 2016-08-21: qty 8.75

## 2016-08-21 MED ORDER — FENTANYL CITRATE (PF) 100 MCG/2ML IJ SOLN
25.0000 ug | INTRAMUSCULAR | Status: AC | PRN
  Administered 2016-08-21 (×2): 25 ug via INTRAVENOUS

## 2016-08-21 SURGICAL SUPPLY — 9 items
BAG HAMPER (MISCELLANEOUS) IMPLANT
DRSG TELFA 3X8 NADH (GAUZE/BANDAGES/DRESSINGS) IMPLANT
GLOVE BIO SURGEON STRL SZ8 (GLOVE) IMPLANT
GOWN STRL REUS W/ TWL XL LVL3 (GOWN DISPOSABLE) IMPLANT
GOWN STRL REUS W/TWL XL LVL3 (GOWN DISPOSABLE)
KIT ROOM TURNOVER AP CYSTO (KITS) IMPLANT
MANIFOLD NEPTUNE II (INSTRUMENTS) IMPLANT
PAD ARMBOARD 7.5X6 YLW CONV (MISCELLANEOUS) IMPLANT
TOWEL OR 17X24 6PK STRL BLUE (TOWEL DISPOSABLE) IMPLANT

## 2016-08-21 NOTE — Anesthesia Procedure Notes (Signed)
Procedure Name: MAC Date/Time: 08/21/2016 12:26 PM Performed by: [facility] Pre-anesthesia Checklist: Patient identified, Emergency Drugs available, Suction available, Timeout performed and Patient being monitored Patient Re-evaluated:Patient Re-evaluated prior to inductionOxygen Delivery Method: Non-rebreather mask

## 2016-08-21 NOTE — Op Note (Signed)
Pre op diagnosis: Prostate Cancer  Post op diagnosis: Same        Procedure: Transrectal ultrasound of the prostate, Ultrasound Guided placement of fiducial markers   Attending: [name]  Anesthesia: MAC  EBL: minimal  Antibiotics: Gentamicin  Drains: none  Findings: Successful placement of 3 gold marks at the right apex midline, right base midline and left lateral lobe at mid gland   Indications: Pt is a 70yo male with a history of prostate cancer. After discussing workup he has elected to proceed with marker placement prior to radiation therapy for his prostate cancer  Procedure in detail: Prior to procedure consent was obtained. The patient was brought to the OR and a brief timeout was done to ensure correct patient, correct procedure, and correct site. General anesthesia was administered and patient was placed in the dorsal lithotomy position. Prostate width measured 3.56cm, height of 3cm and length of 3.1cm . Prostate volume was measured to be 35 cc. We then proceeded to place the 3 gold marks at the right apex midline, right base midline and left lateral lobe at mid gland . This then concluded the procedure which was well tolerated by the patient.   Complications:. None.   Condition: Stable, transferred to PACU  Plan: Patient is to be discharged home. They are to followup in 1 month for androgen deprivation therapy

## 2016-08-21 NOTE — H&P (Signed)
Urology Admission H&P  Chief Complaint: prostate cancer  History of Present Illness: [name] is a 70yo with high risk prostate cancer here for gold seed placement in preparation for IMRT  Past Medical History:  Diagnosis Date  . Arthritis   . Chronic back pain   . COPD (chronic obstructive pulmonary disease) ([place])   . Hypercalcemia   . Kidney stones   . Myocardial infarction   . Sepsis ([place])   . 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 UTI (lower urinary tract infection)    Past Surgical History:  Procedure Laterality Date  . APPENDECTOMY    . BACK SURGERY     10/2013  . HERNIA REPAIR    . MICROLARYNGOSCOPY Bilateral 07/23/2016   Procedure: MICRO DIRECT LARYNGOSCOPY WITH BIOPSY;  Surgeon: Leta Baptist, MD;  Location: [facility];  Service: ENT;  Laterality: Bilateral;  . NEPHRECTOMY Left    04/2014  . PROSTATE BIOPSY N/A 06/05/2016   Procedure: PROSTATE BIOPSY;  Surgeon: Cleon Gustin, MD;  Location: [facility];  Service: Urology;  Laterality: N/A;  . vocal cord biopsy  06/2016    Home Medications:  Prescriptions Prior to Admission  Medication Sig Dispense Refill Last Dose  . acetaminophen (TYLENOL) 500 MG tablet Take 500 mg by mouth every 6 (six) hours as needed (for pain.).   Past Week at Unknown time  . ALPRAZolam (XANAX) 1 MG tablet Take 1 mg by mouth at bedtime.   5 08/20/2016 at 2359  . atorvastatin (LIPITOR) 20 MG tablet Take 20 mg by mouth daily.   08/20/2016 at 1000  . Melatonin 5 MG TABS Take 5 mg by mouth at bedtime as needed (for sleep).   Past Week at Unknown time  . Multiple Vitamin (MULTIVITAMIN WITH MINERALS) TABS tablet Take 1 tablet by mouth daily.   08/20/2016 at 1000  . nitrofurantoin, macrocrystal-monohydrate, (MACROBID) 100 MG capsule Take 1 capsule (100 mg total) by mouth 2 (two) times daily. (Patient taking differently: Take 100 mg by mouth daily. ) 30 capsule 12 08/20/2016 at 1000  . ondansetron (ZOFRAN-ODT) 4 MG  disintegrating tablet Take 1 tablet by mouth every 8 (eight) hours as needed for nausea or vomiting.   0 Past Week at Unknown time  . Oxycodone HCl 10 MG TABS Take 10 mg by mouth every 6 (six) hours as needed (pain).   0 08/21/2016 at 0930  . polyethylene glycol (MIRALAX / GLYCOLAX) packet Take 17 g by mouth daily.    08/20/2016 at 1000  . Potassium Gluconate 595 MG CAPS Take 595 mg by mouth daily.   08/20/2016 at 1000   Allergies: No Known Allergies  Family History  Problem Relation Age of Onset  . Cancer Mother   . Cancer Brother    Social History:  reports that he has been smoking Cigarettes.  He has a 50.00 pack-year smoking history. He has never used smokeless tobacco. He reports that he does not drink alcohol or use drugs.  Review of Systems  All other systems reviewed and are negative.   Physical Exam:  Vital signs in last 24 hours: Temp:  [97.7 F (36.5 C)] 97.7 F (36.5 C) (10/25 1123) Pulse Rate:  [65] 65 (10/25 1123) Resp:  [18-25] 25 (10/25 1130) BP: (161)/(96) 161/96 (10/25 1125) SpO2:  [97 %-100 %] 97 % (10/25 1130) Weight:  [71.7 kg (158 lb)] 71.7 kg (158 lb) (10/25 1123) Physical Exam  Constitutional: He is oriented to person, place,  and time. He appears well-developed and well-nourished.  HENT:  Head: Normocephalic and atraumatic.  Eyes: EOM are normal. Pupils are equal, round, and reactive to light.  Neck: Normal range of motion. No thyromegaly present.  Cardiovascular: Normal rate and regular rhythm.   Respiratory: Effort normal. No respiratory distress.  GI: Soft. He exhibits no distension.  Musculoskeletal: Normal range of motion. He exhibits no edema.  Neurological: He is alert and oriented to person, place, and time.  Skin: Skin is warm and dry.  Psychiatric: He has a normal mood and affect. His behavior is normal. Judgment and thought content normal.    Laboratory Data:  No results found for this or any previous visit (from the past 24 hour(s)). No  results found for this or any previous visit (from the past 240 hour(s)). Creatinine:  Recent Labs  08/16/16 0743  CREATININE 1.27*   Baseline Creatinine: 1.27  Impression/Assessment:  69yo with prostate cancer here for seed implant  Plan:  The risks/benefits/alternatives to gold seed placement was explained to the patient and he understands and wishes to proceed with surgery  [name] 08/21/2016, 11:34 AM

## 2016-08-21 NOTE — Discharge Instructions (Signed)
Anesthesia, Adult, Care After °Refer to this sheet in the next few weeks. These instructions provide you with information on caring for yourself after your procedure. Your health care provider may also give you more specific instructions. Your treatment has been planned according to current medical practices, but problems sometimes occur. Call your health care provider if you have any problems or questions after your procedure. °WHAT TO EXPECT AFTER THE PROCEDURE °After the procedure, it is typical to experience: °· Sleepiness. °· Nausea and vomiting. °HOME CARE INSTRUCTIONS °· For the first 24 hours after general anesthesia: °¨ Have a responsible person with you. °¨ Do not drive a car. If you are alone, do not take public transportation. °¨ Do not drink alcohol. °¨ Do not take medicine that has not been prescribed by your health care provider. °¨ Do not sign important papers or make important decisions. °¨ You may resume a normal diet and activities as directed by your health care provider. °· Change bandages (dressings) as directed. °· If you have questions or problems that seem related to general anesthesia, call the hospital and ask for the anesthetist or anesthesiologist on call. °SEEK MEDICAL CARE IF: °· You have nausea and vomiting that continue the day after anesthesia. °· You develop a rash. °SEEK IMMEDIATE MEDICAL CARE IF:  °· You have difficulty breathing. °· You have chest pain. °· You have any allergic problems. °  °This information is not intended to replace advice given to you by your health care provider. Make sure you discuss any questions you have with your health care provider. °  °Document Released: 01/20/2001 Document Revised: 11/04/2014 Document Reviewed: 02/12/2012 °Elsevier Interactive Patient Education ©2016 [employer] ° °

## 2016-08-21 NOTE — Anesthesia Preprocedure Evaluation (Signed)
Anesthesia Evaluation  Patient identified by MRN, date of birth, ID band Patient awake    Airway Mallampati: II  TM Distance: >3 FB     Dental  (+) Edentulous Upper, Poor Dentition, Missing, Dental Advisory Given   Pulmonary COPD, Current Smoker,    breath sounds clear to auscultation       Cardiovascular + CAD and + Past MI   Rhythm:Regular Rate:Normal     Neuro/Psych    GI/Hepatic negative GI ROS,   Endo/Other    Renal/GU Renal disease     Musculoskeletal   Abdominal   Peds  Hematology   Anesthesia Other Findings Chronic hoarseness.  Reproductive/Obstetrics                             Anesthesia Physical Anesthesia Plan  ASA: III  Anesthesia Plan: MAC   Post-op Pain Management:    Induction: Intravenous  Airway Management Planned: Simple Face Mask  Additional Equipment:   Intra-op Plan:   Post-operative Plan:   Informed Consent: I have reviewed the patients History and Physical, chart, labs and discussed the procedure including the risks, benefits and alternatives for the proposed anesthesia with the patient or authorized representative who has indicated his/her understanding and acceptance.     Plan Discussed with:   Anesthesia Plan Comments:         Anesthesia Quick Evaluation

## 2016-08-21 NOTE — Brief Op Note (Signed)
08/21/2016  12:41 PM  PATIENT:  Ivan [name]  70 y.o. male  PRE-OPERATIVE DIAGNOSIS:  prostate cancer  POST-OPERATIVE DIAGNOSIS:  Prostate cancer  PROCEDURE:  Procedure(s): TRANSRECTAL ULTRASOUND (N/A) GOLD SEED IMPLANT (N/A)  SURGEON:  Surgeon(s) and Role:    * Cleon Gustin, MD - Primary  PHYSICIAN ASSISTANT:   ASSISTANTS: none   ANESTHESIA:   MAC  EBL:  Total I/O In: 400 [I.V.:400] Out: 0   BLOOD ADMINISTERED:none  DRAINS: none   LOCAL MEDICATIONS USED:  NONE  SPECIMEN:  No Specimen  DISPOSITION OF SPECIMEN:  N/A  COUNTS:  YES  TOURNIQUET:  * No tourniquets in log *  DICTATION: .Note written in EPIC  PLAN OF CARE: Discharge to home after PACU  PATIENT DISPOSITION:  PACU - hemodynamically stable.   Delay start of Pharmacological VTE agent (>24hrs) due to surgical blood loss or risk of bleeding: not applicable

## 2016-08-21 NOTE — Anesthesia Postprocedure Evaluation (Signed)
Anesthesia Post Note  Patient: Ivan [name]  Procedure(s) Performed: Procedure(s) (LRB): TRANSRECTAL ULTRASOUND (N/A) GOLD SEED IMPLANT (N/A)  Patient location during evaluation: PACU Anesthesia Type: MAC Level of consciousness: awake Pain management: satisfactory to patient Vital Signs Assessment: post-procedure vital signs reviewed and stable Respiratory status: spontaneous breathing Cardiovascular status: stable Anesthetic complications: no    Last Vitals:  Vitals:   08/21/16 1225 08/21/16 1251  BP:  106/69  Pulse:  66  Resp: (!) 21 19  Temp:  36.4 C    Last Pain:  Vitals:   08/21/16 1251  TempSrc:   PainSc: Asleep                 Maleik Vanderzee

## 2016-08-21 NOTE — Transfer of Care (Signed)
Immediate Anesthesia Transfer of Care Note  Patient: Ivan [name]  Procedure(s) Performed: Procedure(s): TRANSRECTAL ULTRASOUND (N/A) GOLD SEED IMPLANT (N/A)  Patient Location: PACU  Anesthesia Type:MAC  Level of Consciousness: awake and patient cooperative  Airway & Oxygen Therapy: Patient Spontanous Breathing and Patient connected to nasal cannula oxygen  Post-op Assessment: Report given to RN and Post -op Vital signs reviewed and stable  Post vital signs: Reviewed and stable  Last Vitals:  Vitals:   08/21/16 1220 08/21/16 1225  BP: 131/84   Pulse:    Resp: 18 (!) 21  Temp:      Last Pain:  Vitals:   08/21/16 1123  TempSrc: Oral  PainSc: 0-No pain      Patients Stated Pain Goal: 6 ([id number])  Complications: No apparent anesthesia complications

## 2016-08-21 NOTE — Progress Notes (Signed)
Pharmacy Antibiotic Note  Ivan [name] is a 70 y.o. male admitted on 08/21/2016 for surgery.  Pharmacy has been consulted for genatmicin dosing for surgical prophylaxis  Plan: Gentamicin 5 mg/kg IV X 1. Pharmacy will sign off. Please advise if we can be of further assistance  Height: 5' 8.5" (174 cm) Weight: 158 lb (71.7 kg) IBW/kg (Calculated) : 69.55  Temp (24hrs), Avg:97.7 F (36.5 C), Min:97.7 F (36.5 C), Max:97.7 F (36.5 C)   Recent Labs Lab 08/16/16 0743  WBC 7.3  CREATININE 1.27*    Estimated Creatinine Clearance: 54.8 mL/min (by C-G formula based on SCr of 1.27 mg/dL (H)).    No Known Allergies   Thank you for allowing pharmacy to be a part of this patient's care.  Excell Seltzer Poteet 08/21/2016 11:25 AM

## 2016-08-22 DIAGNOSIS — Z8051 Family history of malignant neoplasm of kidney: Secondary | ICD-10-CM | POA: Diagnosis not present

## 2016-08-22 DIAGNOSIS — E785 Hyperlipidemia, unspecified: Secondary | ICD-10-CM | POA: Diagnosis not present

## 2016-08-22 DIAGNOSIS — C61 Malignant neoplasm of prostate: Secondary | ICD-10-CM | POA: Diagnosis not present

## 2016-08-22 DIAGNOSIS — N181 Chronic kidney disease, stage 1: Secondary | ICD-10-CM | POA: Diagnosis not present

## 2016-08-22 DIAGNOSIS — C320 Malignant neoplasm of glottis: Secondary | ICD-10-CM | POA: Diagnosis not present

## 2016-08-22 DIAGNOSIS — Z510 Encounter for antineoplastic radiation therapy: Secondary | ICD-10-CM | POA: Diagnosis not present

## 2016-08-23 DIAGNOSIS — N181 Chronic kidney disease, stage 1: Secondary | ICD-10-CM | POA: Diagnosis not present

## 2016-08-23 DIAGNOSIS — Z510 Encounter for antineoplastic radiation therapy: Secondary | ICD-10-CM | POA: Diagnosis not present

## 2016-08-23 DIAGNOSIS — C61 Malignant neoplasm of prostate: Secondary | ICD-10-CM | POA: Diagnosis not present

## 2016-08-23 DIAGNOSIS — E785 Hyperlipidemia, unspecified: Secondary | ICD-10-CM | POA: Diagnosis not present

## 2016-08-23 DIAGNOSIS — C320 Malignant neoplasm of glottis: Secondary | ICD-10-CM | POA: Diagnosis not present

## 2016-08-23 DIAGNOSIS — Z8051 Family history of malignant neoplasm of kidney: Secondary | ICD-10-CM | POA: Diagnosis not present

## 2016-08-26 ENCOUNTER — Encounter (HOSPITAL_COMMUNITY): Payer: Self-pay | Admitting: Urology

## 2016-08-26 DIAGNOSIS — N181 Chronic kidney disease, stage 1: Secondary | ICD-10-CM | POA: Diagnosis not present

## 2016-08-26 DIAGNOSIS — C320 Malignant neoplasm of glottis: Secondary | ICD-10-CM | POA: Diagnosis not present

## 2016-08-26 DIAGNOSIS — C61 Malignant neoplasm of prostate: Secondary | ICD-10-CM | POA: Diagnosis not present

## 2016-08-26 DIAGNOSIS — Z510 Encounter for antineoplastic radiation therapy: Secondary | ICD-10-CM | POA: Diagnosis not present

## 2016-08-26 DIAGNOSIS — Z8051 Family history of malignant neoplasm of kidney: Secondary | ICD-10-CM | POA: Diagnosis not present

## 2016-08-26 DIAGNOSIS — E785 Hyperlipidemia, unspecified: Secondary | ICD-10-CM | POA: Diagnosis not present

## 2016-08-26 NOTE — Op Note (Signed)
Pre op diagnosis: prostate cancer  Post op diagnosis: same       Procedure: Transrectal ultrasound of the prostate, Ultrasound Guided Placement of fuducial markers.   Attending: [name]  Anesthesia: General  EBL: minimal  Antibiotics: Gentamicin  Drains: none  Findings: no hypoechoic or hyperechoic lesions. Placement of markers at right base midline, right apex midline and left lateral mid gland   Indications: Pt is a 70yo male with a history of prostte cancer scheduled for IMRT. After discussing workup he has elected to proceed with marker placement  Procedure in detail: Prior to procedure consent was obtained. The patient was brought to the OR and a brief timeout was done to ensure correct patient, correct procedure, and correct site. MAC anesthesia was administered and patient was placed in the left lateral decubitus position.  A transrectal ultrasound of the prostate was performed.  Lidocaine (was/ was not)  instilled using ultrasound guidance into the junction of each seminal vesicle of the prostate.  3 Gold markers were placed into the prostate using the standard template and ultrasound guidance.  Accurate placement of the markers was confirmed.     The patient was instructed that he might have blood in his urine, blood in his bowel movement and blood in his semen in the post-procedure period.  He was also instructed to take their antibiotic tablet(s), which he has on hand.  Prostatic ultrasound with successful gold marker placement was well tolerated by the patient.  The patient was given strict instructions to call for fever, nausea, vomiting, flank pain, difficulties urinating, urinary frequency, or other illness.  He will proceed with radiation therapy treatments as planned. This then concluded the procedure which was well tolerated by the patient.   Complications:. None.   Condition: Stable transferred to PACU  Plan: Patient is to be discharged home. They are  to followup in 3 months

## 2016-08-27 DIAGNOSIS — C320 Malignant neoplasm of glottis: Secondary | ICD-10-CM | POA: Diagnosis not present

## 2016-08-27 DIAGNOSIS — C61 Malignant neoplasm of prostate: Secondary | ICD-10-CM | POA: Diagnosis not present

## 2016-08-27 DIAGNOSIS — Z8051 Family history of malignant neoplasm of kidney: Secondary | ICD-10-CM | POA: Diagnosis not present

## 2016-08-27 DIAGNOSIS — N181 Chronic kidney disease, stage 1: Secondary | ICD-10-CM | POA: Diagnosis not present

## 2016-08-27 DIAGNOSIS — Z510 Encounter for antineoplastic radiation therapy: Secondary | ICD-10-CM | POA: Diagnosis not present

## 2016-08-27 DIAGNOSIS — E785 Hyperlipidemia, unspecified: Secondary | ICD-10-CM | POA: Diagnosis not present

## 2016-08-28 DIAGNOSIS — C320 Malignant neoplasm of glottis: Secondary | ICD-10-CM | POA: Diagnosis not present

## 2016-08-28 DIAGNOSIS — Z510 Encounter for antineoplastic radiation therapy: Secondary | ICD-10-CM | POA: Diagnosis not present

## 2016-08-28 DIAGNOSIS — C61 Malignant neoplasm of prostate: Secondary | ICD-10-CM | POA: Diagnosis not present

## 2016-09-02 DIAGNOSIS — C320 Malignant neoplasm of glottis: Secondary | ICD-10-CM | POA: Diagnosis not present

## 2016-09-02 DIAGNOSIS — Z510 Encounter for antineoplastic radiation therapy: Secondary | ICD-10-CM | POA: Diagnosis not present

## 2016-09-02 DIAGNOSIS — C61 Malignant neoplasm of prostate: Secondary | ICD-10-CM | POA: Diagnosis not present

## 2016-09-03 DIAGNOSIS — C61 Malignant neoplasm of prostate: Secondary | ICD-10-CM | POA: Diagnosis not present

## 2016-09-03 DIAGNOSIS — C320 Malignant neoplasm of glottis: Secondary | ICD-10-CM | POA: Diagnosis not present

## 2016-09-03 DIAGNOSIS — Z510 Encounter for antineoplastic radiation therapy: Secondary | ICD-10-CM | POA: Diagnosis not present

## 2016-09-04 DIAGNOSIS — C61 Malignant neoplasm of prostate: Secondary | ICD-10-CM | POA: Diagnosis not present

## 2016-09-04 DIAGNOSIS — C320 Malignant neoplasm of glottis: Secondary | ICD-10-CM | POA: Diagnosis not present

## 2016-09-04 DIAGNOSIS — Z510 Encounter for antineoplastic radiation therapy: Secondary | ICD-10-CM | POA: Diagnosis not present

## 2016-09-05 DIAGNOSIS — C61 Malignant neoplasm of prostate: Secondary | ICD-10-CM | POA: Diagnosis not present

## 2016-09-05 DIAGNOSIS — C320 Malignant neoplasm of glottis: Secondary | ICD-10-CM | POA: Diagnosis not present

## 2016-09-05 DIAGNOSIS — Z510 Encounter for antineoplastic radiation therapy: Secondary | ICD-10-CM | POA: Diagnosis not present

## 2016-09-06 DIAGNOSIS — C320 Malignant neoplasm of glottis: Secondary | ICD-10-CM | POA: Diagnosis not present

## 2016-09-06 DIAGNOSIS — Z510 Encounter for antineoplastic radiation therapy: Secondary | ICD-10-CM | POA: Diagnosis not present

## 2016-09-06 DIAGNOSIS — C61 Malignant neoplasm of prostate: Secondary | ICD-10-CM | POA: Diagnosis not present

## 2016-09-11 DIAGNOSIS — C320 Malignant neoplasm of glottis: Secondary | ICD-10-CM | POA: Diagnosis not present

## 2016-09-11 DIAGNOSIS — Z510 Encounter for antineoplastic radiation therapy: Secondary | ICD-10-CM | POA: Diagnosis not present

## 2016-09-11 DIAGNOSIS — C61 Malignant neoplasm of prostate: Secondary | ICD-10-CM | POA: Diagnosis not present

## 2016-09-12 DIAGNOSIS — Z510 Encounter for antineoplastic radiation therapy: Secondary | ICD-10-CM | POA: Diagnosis not present

## 2016-09-12 DIAGNOSIS — C61 Malignant neoplasm of prostate: Secondary | ICD-10-CM | POA: Diagnosis not present

## 2016-09-12 DIAGNOSIS — C320 Malignant neoplasm of glottis: Secondary | ICD-10-CM | POA: Diagnosis not present

## 2016-09-13 DIAGNOSIS — C61 Malignant neoplasm of prostate: Secondary | ICD-10-CM | POA: Diagnosis not present

## 2016-09-13 DIAGNOSIS — C320 Malignant neoplasm of glottis: Secondary | ICD-10-CM | POA: Diagnosis not present

## 2016-09-13 DIAGNOSIS — Z510 Encounter for antineoplastic radiation therapy: Secondary | ICD-10-CM | POA: Diagnosis not present

## 2016-09-15 DIAGNOSIS — C61 Malignant neoplasm of prostate: Secondary | ICD-10-CM | POA: Diagnosis not present

## 2016-09-16 DIAGNOSIS — C320 Malignant neoplasm of glottis: Secondary | ICD-10-CM | POA: Diagnosis not present

## 2016-09-16 DIAGNOSIS — C61 Malignant neoplasm of prostate: Secondary | ICD-10-CM | POA: Diagnosis not present

## 2016-09-16 DIAGNOSIS — Z510 Encounter for antineoplastic radiation therapy: Secondary | ICD-10-CM | POA: Diagnosis not present

## 2016-09-17 ENCOUNTER — Encounter (HOSPITAL_COMMUNITY): Payer: Medicare Other | Attending: Hematology & Oncology | Admitting: Oncology

## 2016-09-17 ENCOUNTER — Encounter (HOSPITAL_COMMUNITY): Payer: Medicare Other

## 2016-09-17 ENCOUNTER — Encounter (HOSPITAL_COMMUNITY): Payer: Self-pay | Admitting: Oncology

## 2016-09-17 VITALS — BP 125/64 | HR 86 | Temp 97.8°F | Resp 16 | Ht 69.0 in | Wt 155.8 lb

## 2016-09-17 DIAGNOSIS — C320 Malignant neoplasm of glottis: Secondary | ICD-10-CM

## 2016-09-17 DIAGNOSIS — Z510 Encounter for antineoplastic radiation therapy: Secondary | ICD-10-CM | POA: Diagnosis not present

## 2016-09-17 DIAGNOSIS — D751 Secondary polycythemia: Secondary | ICD-10-CM | POA: Insufficient documentation

## 2016-09-17 DIAGNOSIS — Z720 Tobacco use: Secondary | ICD-10-CM | POA: Diagnosis not present

## 2016-09-17 DIAGNOSIS — C61 Malignant neoplasm of prostate: Secondary | ICD-10-CM | POA: Diagnosis not present

## 2016-09-17 LAB — COMPREHENSIVE METABOLIC PANEL
ALBUMIN: 3.9 g/dL (ref 3.5–5.0)
ALK PHOS: 103 U/L (ref 38–126)
ALT: 24 U/L (ref 17–63)
AST: 24 U/L (ref 15–41)
Anion gap: 5 (ref 5–15)
BUN: 23 mg/dL — ABNORMAL HIGH (ref 6–20)
CALCIUM: 10.8 mg/dL — AB (ref 8.9–10.3)
CO2: 29 mmol/L (ref 22–32)
CREATININE: 1.21 mg/dL (ref 0.61–1.24)
Chloride: 105 mmol/L (ref 101–111)
GFR calc non Af Amer: 60 mL/min — ABNORMAL LOW (ref >60)
GLUCOSE: 131 mg/dL — AB (ref 65–99)
Potassium: 4.4 mmol/L (ref 3.5–5.1)
SODIUM: 139 mmol/L (ref 135–145)
Total Bilirubin: 0.8 mg/dL (ref 0.3–1.2)
Total Protein: 6.7 g/dL (ref 6.5–8.1)

## 2016-09-17 LAB — CBC WITH DIFFERENTIAL/PLATELET
Basophils Absolute: 0 10*3/uL (ref 0.0–0.1)
Basophils Relative: 1 %
EOS ABS: 0.2 10*3/uL (ref 0.0–0.7)
Eosinophils Relative: 4 %
HEMATOCRIT: 47.1 % (ref 39.0–52.0)
HEMOGLOBIN: 16.2 g/dL (ref 13.0–17.0)
LYMPHS ABS: 0.7 10*3/uL (ref 0.7–4.0)
LYMPHS PCT: 13 %
MCH: 31.2 pg (ref 26.0–34.0)
MCHC: 34.4 g/dL (ref 30.0–36.0)
MCV: 90.8 fL (ref 78.0–100.0)
Monocytes Absolute: 0.3 10*3/uL (ref 0.1–1.0)
Monocytes Relative: 6 %
NEUTROS ABS: 4.2 10*3/uL (ref 1.7–7.7)
NEUTROS PCT: 76 %
Platelets: 96 10*3/uL — ABNORMAL LOW (ref 150–400)
RBC: 5.19 MIL/uL (ref 4.22–5.81)
RDW: 13.6 % (ref 11.5–15.5)
WBC: 5.5 10*3/uL (ref 4.0–10.5)

## 2016-09-17 NOTE — Patient Instructions (Signed)
[facility] at [facility] Discharge Instructions  RECOMMENDATIONS MADE BY THE CONSULTANT AND ANY TEST RESULTS WILL BE SENT TO YOUR REFERRING PHYSICIAN.  You were seen today by Kirby Crigler PA-C. Labs drawn today, will call with results. Return in 4-6 weeks for follow up.  Thank you for choosing [facility] at [facility] to provide your oncology and hematology care.  To afford each patient quality time with our provider, please arrive at least 15 minutes before your scheduled appointment time.   Beginning January 23rd 2017 lab work for the [facility] will be done in the  Main lab at [facility] on 1st floor. If you have a lab appointment with the [facility] please come in thru the  Main Entrance and check in at the main information desk  You need to re-schedule your appointment should you arrive 10 or more minutes late.  We strive to give you quality time with our providers, and arriving late affects you and other patients whose appointments are after yours.  Also, if you no show three or more times for appointments you may be dismissed from the clinic at the providers discretion.     Again, thank you for choosing [facility].  Our hope is that these requests will decrease the amount of time that you wait before being seen by our physicians.       _____________________________________________________________  Should you have questions after your visit to [facility], please contact our office at [phone] between the hours of 8:30 a.m. and 4:30 p.m.  Voicemails left after 4:30 p.m. will not be returned until the following business day.  For prescription refill requests, have your pharmacy contact our office.         Resources For Cancer Patients and their Caregivers ? American Cancer Society: Can assist with transportation, wigs, general needs, runs Look Good Feel Better.        670-047-6712 ? Cancer  Care: Provides financial assistance, online support groups, medication/co-pay assistance.  1-800-813-HOPE 507-766-6195) ? [place] Assists [place] cancer patients and their families through emotional , educational and financial support.  317-303-0178 ? [place] DSS Where to apply for food stamps, Medicaid and utility assistance. 918-047-7129 ? [facility]: Transportation to medical appointments. (351)750-7333 ? Social Security Administration: May apply for disability if have a Stage IV cancer. (940)201-4741 980-791-4095 ? [employer], Disability and Transit Services: Assists with nutrition, care and transit needs. [place] Support Programs: @10RELATIVEDAYS @ > Cancer Support Group  2nd Tuesday of the month 1pm-2pm, Journey Room  > Creative Journey  3rd Tuesday of the month 1130am-1pm, Journey Room  > Look Good Feel Better  1st Wednesday of the month 10am-12 noon, Journey Room (Call [place] to register [phone])

## 2016-09-17 NOTE — Progress Notes (Signed)
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  Work-up for polycythemia.  INTERVAL HISTORY: Ivan [name] 70 y.o. 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 There is probable supraglottic extension along the anterior false cords and suspected laryngeal cartilage erosion anteriorly at the level of of the commissure. No extension beyond the larynx, extension into the subglottic airway, or lymphadenopathy is identified.      08/06/2016 Imaging    CT chest- 1. Asymmetric interstitial changes involving the right lobe suggestive of interstitial lung disease. 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 A followup high-resolution CT of the chest is recommended in 6 months to assess for temporal change and provide a more detailed assessment of interstitial changes. 2. Enlarged mediastinal and right hilar lymph nodes. Nodes measure up to 1.5 cm which is  considered pathologically enlarged by size criteria. In the setting of interstitial lung disease this is a nonspecific finding and may be reactive. 3. Aortic atherosclerosis, cardiac enlargement and multi vessel coronary artery calcification 4. Small pulmonary nodules are identified in the right lung. The largest measures 6 mm. Attention at follow-up imaging advised.       He is undergoing XRT for head/neck cancer and prostate cancer.  He is tolerating treatment as expected.  He has 2 more weeks worth of XRT for head and neck cancer but will continue with XRT for prostate cancer well into Jan 2018.  His appetite is down.  His weight is relatively stable.  He is using ensure at home to supplement his oral intake.  He notes significant fatigue from treatment.  Review of Systems  Constitutional: Positive for malaise/fatigue and weight loss. Negative for chills and fever.  HENT: Positive for sore throat.   Eyes: Negative.   Respiratory: Negative.  Negative for cough and hemoptysis.   Cardiovascular: Negative.  Negative for chest pain.  Gastrointestinal: Negative for nausea and vomiting.  Genitourinary: Negative.   Musculoskeletal: Negative.   Skin: Negative.   Neurological: Negative.  Negative for weakness.  Endo/Heme/Allergies: Negative.   Psychiatric/Behavioral: Negative.     Past Medical History:  Diagnosis Date  . Arthritis   . Chronic back pain   . COPD (chronic obstructive pulmonary disease) ([facility])   . Hypercalcemia   . Kidney stones   . Myocardial infarction   . Sepsis ([facility])   . 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 UTI (lower urinary tract infection)     Past Surgical History:  Procedure Laterality Date  .  APPENDECTOMY    . BACK SURGERY     10/2013  . GOLD SEED IMPLANT N/A 08/21/2016   Procedure: GOLD SEED IMPLANT;  Surgeon: Cleon Gustin, MD;  Location: [facility];  Service: Urology;  Laterality: N/A;  . HERNIA REPAIR    .  MICROLARYNGOSCOPY Bilateral 07/23/2016   Procedure: MICRO DIRECT LARYNGOSCOPY WITH BIOPSY;  Surgeon: Leta Baptist, MD;  Location: [facility];  Service: ENT;  Laterality: Bilateral;  . NEPHRECTOMY Left    04/2014  . PROSTATE BIOPSY N/A 06/05/2016   Procedure: PROSTATE BIOPSY;  Surgeon: Cleon Gustin, MD;  Location: [facility];  Service: Urology;  Laterality: N/A;  . TRANSRECTAL ULTRASOUND N/A 08/21/2016   Procedure: TRANSRECTAL ULTRASOUND;  Surgeon: Cleon Gustin, MD;  Location: [facility];  Service: Urology;  Laterality: N/A;  . vocal cord biopsy  06/2016    Family History  Problem Relation Age of Onset  . Cancer Mother   . Cancer Brother     Social History   Social History  . Marital status: Married    Spouse name: N/A  . Number of children: N/A  . Years of education: N/A   Social History Main Topics  . Smoking status: Current Every Day Smoker    Packs/day: 1.00    Years: 50.00    Types: Cigarettes  . Smokeless tobacco: Never Used  . Alcohol use No  . Drug use: No  . Sexual activity: Yes   Other Topics Concern  . None   Social History Narrative  . None     PHYSICAL EXAMINATION  ECOG PERFORMANCE STATUS: 2 - Symptomatic, <50% confined to bed  Vitals:   09/17/16 1025  BP: 125/64  Pulse: 86  Resp: 16  Temp: 97.8 F (36.6 C)    GENERAL:alert, no distress, ill looking and accompanied by his wife, Clarene Critchley.  SKIN: skin color, texture, turgor are normal, no rashes or significant lesions HEAD: Normocephalic, No masses, lesions, tenderness or abnormalities EYES: normal, Conjunctiva are pink and non-injected EARS: External ears normal OROPHARYNX:no exudate, no erythema and lips, buccal mucosa, and tongue normal  NECK: supple, trachea midline, erythematous at treatment site LYMPH:  no palpable lymphadenopathy BREAST:not examined LUNGS: clear to auscultation and percussion, decreased breath sounds HEART: regular rate & rhythm ABDOMEN:abdomen soft and  normal bowel sounds BACK: Back symmetric, no curvature. EXTREMITIES:less then 2 second capillary refill, no joint deformities, effusion, or inflammation, no skin discoloration, no cyanosis  NEURO: alert & oriented x 3 with fluent speech, in wheelchair.   LABORATORY DATA: CBC    Component Value Date/Time   WBC 7.3 08/16/2016 0743   RBC 5.90 (H) 08/16/2016 0743   HGB 18.3 (H) 08/16/2016 0743   HCT 52.5 (H) 08/16/2016 0743   PLT 115 (L) 08/16/2016 0743   MCV 89.0 08/16/2016 0743   MCH 31.0 08/16/2016 0743   MCHC 34.9 08/16/2016 0743   RDW 13.0 08/16/2016 0743   LYMPHSABS 1.2 08/16/2016 0743   MONOABS 0.5 08/16/2016 0743   EOSABS 0.3 08/16/2016 0743   BASOSABS 0.1 08/16/2016 0743      Chemistry      Component Value Date/Time   NA 138 08/16/2016 0743   K 4.5 08/16/2016 0743   CL 103 08/16/2016 0743   CO2 28 08/16/2016 0743   BUN 25 (H) 08/16/2016 0743   CREATININE 1.27 (H) 08/16/2016 0743      Component Value Date/Time   CALCIUM 11.5 (H) 08/16/2016 0743   CALCIUM 9.7 09/16/2014 0347  ALKPHOS 114 09/18/2014 0530   AST 17 09/18/2014 0530   ALT 17 09/18/2014 0530   BILITOT 1.5 (H) 09/18/2014 0530        PENDING LABS:   RADIOGRAPHIC STUDIES:  US Guided Needle Placement  Result Date: 08/21/2016 CLINICAL DATA: GOLD SEED...PROSTATE CA Ultrasound was provided for use by the ordering physician, and a technical charge was applied by the performing facility.  No radiologist interpretation/professional services rendered.     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  Oncology history is updated.  Labs today: CBC diff, CMET.  Additional labs for peripheral work-up of polycythemia are ordered: EPO level, JAK2 V617F with reflex to exon 12 and 13 and CALR/E12/MPL, and BCR/ABL, peripheral smear review.  To complete the work-up for polycythemia,  if necessary, a UA for hematuria evaluation, ABGs for evaluation for CO exposure, and chest xray of chest to evaluation for arteriovenous malformations, chronic obstructive lung disease, and/or pulmonary hypertension can be performed.  Smoking cessation education is provided and brochure for free smoking cessation class is provided.  Return in 4-6 weeks for follow-up and review of lab data.     ORDERS PLACED FOR THIS ENCOUNTER: Orders Placed This Encounter  Procedures  . CBC with Differential  . Comprehensive metabolic panel  . JAK2 V617F, w Reflex to CALR/E12/MPL  . Erythropoietin  . BCR-ABL1, CML/ALL, PCR, QUANT  . Pathologist smear review  . Urinalysis, Routine w reflex microscopic    MEDICATIONS PRESCRIBED THIS ENCOUNTER: No orders of the defined types were placed in this encounter.   THERAPY PLAN:  Complete XRT with curative intent.  Peripheral work-up for polycythemia is underway.  All questions were answered. The patient knows to call the clinic with any problems, questions or concerns. We can certainly see the patient much sooner if necessary.  Patient and plan discussed with Dr. Ancil Linsey and she is in agreement with the aforementioned.   This note is electronically signed by: Doy Mince 09/17/2016 10:37 AM

## 2016-09-17 NOTE — Assessment & Plan Note (Addendum)
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  Oncology history is updated.  Labs today: CBC diff, CMET.  Additional labs for peripheral work-up of polycythemia are ordered: EPO level, JAK2 V617F with reflex to exon 12 and 13 and CALR/E12/MPL, and BCR/ABL, peripheral smear review.  To complete the work-up for polycythemia, if necessary, a UA for hematuria evaluation, ABGs for evaluation for CO exposure, and chest xray of chest to evaluation for arteriovenous malformations, chronic obstructive lung disease, and/or pulmonary hypertension can be performed.  Smoking cessation education is provided and brochure for free smoking cessation class is provided.  Return in 4-6 weeks for follow-up and review of lab data.

## 2016-09-18 DIAGNOSIS — C320 Malignant neoplasm of glottis: Secondary | ICD-10-CM | POA: Diagnosis not present

## 2016-09-18 DIAGNOSIS — C61 Malignant neoplasm of prostate: Secondary | ICD-10-CM | POA: Diagnosis not present

## 2016-09-18 DIAGNOSIS — Z510 Encounter for antineoplastic radiation therapy: Secondary | ICD-10-CM | POA: Diagnosis not present

## 2016-09-18 LAB — ERYTHROPOIETIN: Erythropoietin: 13.1 m[IU]/mL (ref 2.6–18.5)

## 2016-09-22 DIAGNOSIS — Z510 Encounter for antineoplastic radiation therapy: Secondary | ICD-10-CM | POA: Diagnosis not present

## 2016-09-22 DIAGNOSIS — C61 Malignant neoplasm of prostate: Secondary | ICD-10-CM | POA: Diagnosis not present

## 2016-09-22 DIAGNOSIS — C320 Malignant neoplasm of glottis: Secondary | ICD-10-CM | POA: Diagnosis not present

## 2016-09-23 DIAGNOSIS — C320 Malignant neoplasm of glottis: Secondary | ICD-10-CM | POA: Diagnosis not present

## 2016-09-23 DIAGNOSIS — Z510 Encounter for antineoplastic radiation therapy: Secondary | ICD-10-CM | POA: Diagnosis not present

## 2016-09-23 DIAGNOSIS — C61 Malignant neoplasm of prostate: Secondary | ICD-10-CM | POA: Diagnosis not present

## 2016-09-24 DIAGNOSIS — Z510 Encounter for antineoplastic radiation therapy: Secondary | ICD-10-CM | POA: Diagnosis not present

## 2016-09-24 DIAGNOSIS — C61 Malignant neoplasm of prostate: Secondary | ICD-10-CM | POA: Diagnosis not present

## 2016-09-24 DIAGNOSIS — C320 Malignant neoplasm of glottis: Secondary | ICD-10-CM | POA: Diagnosis not present

## 2016-09-25 ENCOUNTER — Ambulatory Visit (INDEPENDENT_AMBULATORY_CARE_PROVIDER_SITE_OTHER): Payer: Medicare Other | Admitting: Urology

## 2016-09-25 DIAGNOSIS — C61 Malignant neoplasm of prostate: Secondary | ICD-10-CM

## 2016-09-25 DIAGNOSIS — Z510 Encounter for antineoplastic radiation therapy: Secondary | ICD-10-CM | POA: Diagnosis not present

## 2016-09-25 DIAGNOSIS — C320 Malignant neoplasm of glottis: Secondary | ICD-10-CM | POA: Diagnosis not present

## 2016-09-26 LAB — BCR-ABL1, CML/ALL, PCR, QUANT

## 2016-09-30 DIAGNOSIS — Z510 Encounter for antineoplastic radiation therapy: Secondary | ICD-10-CM | POA: Diagnosis not present

## 2016-09-30 DIAGNOSIS — C61 Malignant neoplasm of prostate: Secondary | ICD-10-CM | POA: Diagnosis not present

## 2016-09-30 DIAGNOSIS — C320 Malignant neoplasm of glottis: Secondary | ICD-10-CM | POA: Diagnosis not present

## 2016-10-01 DIAGNOSIS — C320 Malignant neoplasm of glottis: Secondary | ICD-10-CM | POA: Diagnosis not present

## 2016-10-01 DIAGNOSIS — C61 Malignant neoplasm of prostate: Secondary | ICD-10-CM | POA: Diagnosis not present

## 2016-10-01 DIAGNOSIS — Z510 Encounter for antineoplastic radiation therapy: Secondary | ICD-10-CM | POA: Diagnosis not present

## 2016-10-02 DIAGNOSIS — C320 Malignant neoplasm of glottis: Secondary | ICD-10-CM | POA: Diagnosis not present

## 2016-10-02 DIAGNOSIS — C61 Malignant neoplasm of prostate: Secondary | ICD-10-CM | POA: Diagnosis not present

## 2016-10-02 DIAGNOSIS — Z510 Encounter for antineoplastic radiation therapy: Secondary | ICD-10-CM | POA: Diagnosis not present

## 2016-10-03 DIAGNOSIS — C61 Malignant neoplasm of prostate: Secondary | ICD-10-CM | POA: Diagnosis not present

## 2016-10-03 DIAGNOSIS — C320 Malignant neoplasm of glottis: Secondary | ICD-10-CM | POA: Diagnosis not present

## 2016-10-03 DIAGNOSIS — Z510 Encounter for antineoplastic radiation therapy: Secondary | ICD-10-CM | POA: Diagnosis not present

## 2016-10-03 LAB — CALR + JAK2 E12-15 + MPL (REFLEXED)

## 2016-10-03 LAB — JAK2 V617F, W REFLEX TO CALR/E12/MPL

## 2016-10-04 DIAGNOSIS — C320 Malignant neoplasm of glottis: Secondary | ICD-10-CM | POA: Diagnosis not present

## 2016-10-04 DIAGNOSIS — M545 Low back pain: Secondary | ICD-10-CM | POA: Diagnosis not present

## 2016-10-04 DIAGNOSIS — Z510 Encounter for antineoplastic radiation therapy: Secondary | ICD-10-CM | POA: Diagnosis not present

## 2016-10-04 DIAGNOSIS — C61 Malignant neoplasm of prostate: Secondary | ICD-10-CM | POA: Diagnosis not present

## 2016-10-04 DIAGNOSIS — J449 Chronic obstructive pulmonary disease, unspecified: Secondary | ICD-10-CM | POA: Diagnosis not present

## 2016-10-04 DIAGNOSIS — C329 Malignant neoplasm of larynx, unspecified: Secondary | ICD-10-CM | POA: Diagnosis not present

## 2016-10-07 DIAGNOSIS — Z510 Encounter for antineoplastic radiation therapy: Secondary | ICD-10-CM | POA: Diagnosis not present

## 2016-10-07 DIAGNOSIS — C320 Malignant neoplasm of glottis: Secondary | ICD-10-CM | POA: Diagnosis not present

## 2016-10-07 DIAGNOSIS — C61 Malignant neoplasm of prostate: Secondary | ICD-10-CM | POA: Diagnosis not present

## 2016-10-08 DIAGNOSIS — C61 Malignant neoplasm of prostate: Secondary | ICD-10-CM | POA: Diagnosis not present

## 2016-10-08 DIAGNOSIS — Z510 Encounter for antineoplastic radiation therapy: Secondary | ICD-10-CM | POA: Diagnosis not present

## 2016-10-08 DIAGNOSIS — C320 Malignant neoplasm of glottis: Secondary | ICD-10-CM | POA: Diagnosis not present

## 2016-10-14 DIAGNOSIS — C61 Malignant neoplasm of prostate: Secondary | ICD-10-CM | POA: Diagnosis not present

## 2016-10-14 DIAGNOSIS — C320 Malignant neoplasm of glottis: Secondary | ICD-10-CM | POA: Diagnosis not present

## 2016-10-14 DIAGNOSIS — Z510 Encounter for antineoplastic radiation therapy: Secondary | ICD-10-CM | POA: Diagnosis not present

## 2016-10-15 DIAGNOSIS — C61 Malignant neoplasm of prostate: Secondary | ICD-10-CM | POA: Diagnosis not present

## 2016-10-15 DIAGNOSIS — C320 Malignant neoplasm of glottis: Secondary | ICD-10-CM | POA: Diagnosis not present

## 2016-10-15 DIAGNOSIS — Z510 Encounter for antineoplastic radiation therapy: Secondary | ICD-10-CM | POA: Diagnosis not present

## 2016-10-16 DIAGNOSIS — C320 Malignant neoplasm of glottis: Secondary | ICD-10-CM | POA: Diagnosis not present

## 2016-10-16 DIAGNOSIS — Z510 Encounter for antineoplastic radiation therapy: Secondary | ICD-10-CM | POA: Diagnosis not present

## 2016-10-16 DIAGNOSIS — C61 Malignant neoplasm of prostate: Secondary | ICD-10-CM | POA: Diagnosis not present

## 2016-10-17 DIAGNOSIS — Z510 Encounter for antineoplastic radiation therapy: Secondary | ICD-10-CM | POA: Diagnosis not present

## 2016-10-17 DIAGNOSIS — C320 Malignant neoplasm of glottis: Secondary | ICD-10-CM | POA: Diagnosis not present

## 2016-10-17 DIAGNOSIS — C61 Malignant neoplasm of prostate: Secondary | ICD-10-CM | POA: Diagnosis not present

## 2016-10-18 DIAGNOSIS — C320 Malignant neoplasm of glottis: Secondary | ICD-10-CM | POA: Diagnosis not present

## 2016-10-18 DIAGNOSIS — C61 Malignant neoplasm of prostate: Secondary | ICD-10-CM | POA: Diagnosis not present

## 2016-10-18 DIAGNOSIS — Z510 Encounter for antineoplastic radiation therapy: Secondary | ICD-10-CM | POA: Diagnosis not present

## 2016-10-22 DIAGNOSIS — C320 Malignant neoplasm of glottis: Secondary | ICD-10-CM | POA: Diagnosis not present

## 2016-10-22 DIAGNOSIS — C61 Malignant neoplasm of prostate: Secondary | ICD-10-CM | POA: Diagnosis not present

## 2016-10-22 DIAGNOSIS — Z510 Encounter for antineoplastic radiation therapy: Secondary | ICD-10-CM | POA: Diagnosis not present

## 2016-10-23 DIAGNOSIS — C320 Malignant neoplasm of glottis: Secondary | ICD-10-CM | POA: Diagnosis not present

## 2016-10-23 DIAGNOSIS — Z510 Encounter for antineoplastic radiation therapy: Secondary | ICD-10-CM | POA: Diagnosis not present

## 2016-10-23 DIAGNOSIS — C61 Malignant neoplasm of prostate: Secondary | ICD-10-CM | POA: Diagnosis not present

## 2016-10-24 DIAGNOSIS — Z510 Encounter for antineoplastic radiation therapy: Secondary | ICD-10-CM | POA: Diagnosis not present

## 2016-10-24 DIAGNOSIS — C61 Malignant neoplasm of prostate: Secondary | ICD-10-CM | POA: Diagnosis not present

## 2016-10-24 DIAGNOSIS — C320 Malignant neoplasm of glottis: Secondary | ICD-10-CM | POA: Diagnosis not present

## 2016-10-25 DIAGNOSIS — Z510 Encounter for antineoplastic radiation therapy: Secondary | ICD-10-CM | POA: Diagnosis not present

## 2016-10-25 DIAGNOSIS — C61 Malignant neoplasm of prostate: Secondary | ICD-10-CM | POA: Diagnosis not present

## 2016-10-25 DIAGNOSIS — C320 Malignant neoplasm of glottis: Secondary | ICD-10-CM | POA: Diagnosis not present

## 2016-10-29 ENCOUNTER — Ambulatory Visit (HOSPITAL_COMMUNITY): Payer: Medicare Other | Admitting: Hematology & Oncology

## 2016-10-29 DIAGNOSIS — C61 Malignant neoplasm of prostate: Secondary | ICD-10-CM | POA: Diagnosis not present

## 2016-10-29 DIAGNOSIS — Z510 Encounter for antineoplastic radiation therapy: Secondary | ICD-10-CM | POA: Diagnosis not present

## 2016-10-29 DIAGNOSIS — C320 Malignant neoplasm of glottis: Secondary | ICD-10-CM | POA: Diagnosis not present

## 2016-10-30 DIAGNOSIS — C320 Malignant neoplasm of glottis: Secondary | ICD-10-CM | POA: Diagnosis not present

## 2016-10-30 DIAGNOSIS — Z510 Encounter for antineoplastic radiation therapy: Secondary | ICD-10-CM | POA: Diagnosis not present

## 2016-10-30 DIAGNOSIS — C61 Malignant neoplasm of prostate: Secondary | ICD-10-CM | POA: Diagnosis not present

## 2016-10-31 DIAGNOSIS — C61 Malignant neoplasm of prostate: Secondary | ICD-10-CM | POA: Diagnosis not present

## 2016-10-31 DIAGNOSIS — Z510 Encounter for antineoplastic radiation therapy: Secondary | ICD-10-CM | POA: Diagnosis not present

## 2016-10-31 DIAGNOSIS — C320 Malignant neoplasm of glottis: Secondary | ICD-10-CM | POA: Diagnosis not present

## 2016-11-01 DIAGNOSIS — C61 Malignant neoplasm of prostate: Secondary | ICD-10-CM | POA: Diagnosis not present

## 2016-11-01 DIAGNOSIS — C320 Malignant neoplasm of glottis: Secondary | ICD-10-CM | POA: Diagnosis not present

## 2016-11-01 DIAGNOSIS — Z510 Encounter for antineoplastic radiation therapy: Secondary | ICD-10-CM | POA: Diagnosis not present

## 2016-11-04 ENCOUNTER — Encounter (HOSPITAL_COMMUNITY): Payer: Medicare Other | Attending: Hematology & Oncology | Admitting: Hematology & Oncology

## 2016-11-04 ENCOUNTER — Encounter (HOSPITAL_COMMUNITY): Payer: Self-pay | Admitting: Hematology & Oncology

## 2016-11-04 VITALS — BP 118/64 | HR 77 | Temp 97.9°F | Resp 18 | Wt 155.8 lb

## 2016-11-04 DIAGNOSIS — G8929 Other chronic pain: Secondary | ICD-10-CM | POA: Diagnosis not present

## 2016-11-04 DIAGNOSIS — C61 Malignant neoplasm of prostate: Secondary | ICD-10-CM

## 2016-11-04 DIAGNOSIS — R918 Other nonspecific abnormal finding of lung field: Secondary | ICD-10-CM | POA: Diagnosis not present

## 2016-11-04 DIAGNOSIS — D751 Secondary polycythemia: Secondary | ICD-10-CM | POA: Insufficient documentation

## 2016-11-04 DIAGNOSIS — M549 Dorsalgia, unspecified: Secondary | ICD-10-CM

## 2016-11-04 DIAGNOSIS — C320 Malignant neoplasm of glottis: Secondary | ICD-10-CM

## 2016-11-04 DIAGNOSIS — D696 Thrombocytopenia, unspecified: Secondary | ICD-10-CM

## 2016-11-04 DIAGNOSIS — Z510 Encounter for antineoplastic radiation therapy: Secondary | ICD-10-CM | POA: Diagnosis not present

## 2016-11-04 NOTE — Patient Instructions (Signed)
[place] at [facility] Discharge Instructions  RECOMMENDATIONS MADE BY THE CONSULTANT AND ANY TEST RESULTS WILL BE SENT TO YOUR REFERRING PHYSICIAN.  Return 2nd week in February and have labs drawn same day  Thank you for choosing [place] at [facility] to provide your oncology and hematology care.  To afford each patient quality time with our provider, please arrive at least 15 minutes before your scheduled appointment time.    If you have a lab appointment with the [facility] please come in thru the  Main Entrance and check in at the main information desk  You need to re-schedule your appointment should you arrive 10 or more minutes late.  We strive to give you quality time with our providers, and arriving late affects you and other patients whose appointments are after yours.  Also, if you no show three or more times for appointments you may be dismissed from the clinic at the providers discretion.     Again, thank you for choosing [facility].  Our hope is that these requests will decrease the amount of time that you wait before being seen by our physicians.       _____________________________________________________________  Should you have questions after your visit to [facility], please contact our office at [phone] between the hours of 8:30 a.m. and 4:30 p.m.  Voicemails left after 4:30 p.m. will not be returned until the following business day.  For prescription refill requests, have your pharmacy contact our office.       Resources For Cancer Patients and their Caregivers ? American Cancer Society: Can assist with transportation, wigs, general needs, runs Look Good Feel Better.        438-351-3153 ? Cancer Care: Provides financial assistance, online support groups, medication/co-pay assistance.  1-800-813-HOPE (667)726-5415) ? [facility] Assists [place] cancer  patients and their families through emotional , educational and financial support.  812 529 7172 ? [place] DSS Where to apply for food stamps, Medicaid and utility assistance. 608-366-5061 ? [facility]: Transportation to medical appointments. (772)572-4257 ? Social Security Administration: May apply for disability if have a Stage IV cancer. 715-557-7008 (440)741-4341 ? [employer], Disability and Transit Services: Assists with nutrition, care and transit needs. [place] Support Programs: @10RELATIVEDAYS @ > Cancer Support Group  2nd Tuesday of the month 1pm-2pm, Journey Room  > Creative Journey  3rd Tuesday of the month 1130am-1pm, Journey Room  > Look Good Feel Better  1st Wednesday of the month 10am-12 noon, Journey Room (Call [facility] to register [phone])

## 2016-11-04 NOTE — Progress Notes (Signed)
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 There is probable supraglottic extension along the anterior false cords and suspected laryngeal cartilage erosion anteriorly at the level of of the commissure. No extension beyond the larynx, extension into the subglottic airway, or lymphadenopathy is identified.      08/06/2016 Imaging    CT chest- 1. Asymmetric interstitial changes involving the right lobe suggestive of interstitial lung disease. 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 A followup high-resolution CT of the chest is recommended in 6 months to assess for temporal change and provide a more detailed assessment of interstitial changes. 2. Enlarged mediastinal and right hilar lymph nodes. Nodes measure up to 1.5 cm which is considered pathologically enlarged by size criteria. In the setting of interstitial lung disease this is a nonspecific finding and may be reactive. 3. Aortic atherosclerosis, cardiac enlargement and multi vessel coronary artery calcification 4. Small pulmonary nodules are identified in the right lung. The largest measures 6 mm. Attention at follow-up imaging advised.       HISTORY OF PRESENTING ILLNESS:  Ivan [name] 71 y.o. male is here for follow-up of squamous cell carcinoma of the L  vocal cord.  Referred by Dr. Benjamine Mola. He has completed XRT.  Mr. Interrante presents to the cancer center today accompanied by his wife. He presents in a wheelchair. I have reviewed prior  labs with the patient and his wife.  He feels better since he has finished radiation to the head and neck. He is still getting radiation for his prostate. He had a treatment this morning. He has been experiencing diarrhea for this, although he notes that it is not bad.   He is still experiencing back pain. He take some pain medication for this. After radiation, his wife would like a second opinion.   His throat has felt much better. He sees Dr. Benjamine Mola on February 1.   He had his flu shot.  He is eating ok, appetite is marginal.   MEDICAL HISTORY:  Past Medical History:  Diagnosis Date  . Arthritis   . Chronic back pain   . COPD (chronic obstructive pulmonary disease) ([place])   . Hypercalcemia   . Kidney stones   . Myocardial infarction   . Sepsis ([place])   . 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 UTI (lower urinary tract infection)     SURGICAL HISTORY: Past Surgical History:  Procedure Laterality Date  . APPENDECTOMY    . BACK SURGERY     10/2013  . GOLD SEED IMPLANT N/A 08/21/2016   Procedure: GOLD SEED IMPLANT;  Surgeon: Cleon Gustin, MD;  Location: [facility];  Service: Urology;  Laterality: N/A;  . HERNIA REPAIR    . MICROLARYNGOSCOPY Bilateral 07/23/2016   Procedure: MICRO DIRECT LARYNGOSCOPY WITH BIOPSY;  Surgeon: Leta Baptist, MD;  Location: [facility];  Service: ENT;  Laterality: Bilateral;  . NEPHRECTOMY Left    04/2014  . PROSTATE BIOPSY N/A 06/05/2016   Procedure: PROSTATE BIOPSY;  Surgeon: Cleon Gustin, MD;  Location: [facility];  Service: Urology;  Laterality: N/A;  . TRANSRECTAL ULTRASOUND N/A 08/21/2016   Procedure: TRANSRECTAL ULTRASOUND;  Surgeon: Cleon Gustin, MD;  Location: [facility];  Service: Urology;  Laterality: N/A;  . vocal cord biopsy   06/2016    SOCIAL HISTORY: Social History   Social History  . Marital status: Married    Spouse name: N/A  . Number of children: N/A  . Years of education: N/A   Occupational History  . Not on file.   Social History Main Topics  . Smoking status: Current Every Day Smoker    Packs/day: 1.00    Years: 50.00    Types: Cigarettes  . Smokeless tobacco: Never Used  . Alcohol use No  . Drug use: No  . Sexual activity: Yes   Other Topics Concern  . Not on file   Social History Narrative  . No narrative on file  Married 35 years. One child from another wife. One grandson. One dog and one horse. Used to work as a Tax inspector work. No longer drinks alcohol  FAMILY HISTORY: Family History  Problem Relation Age of Onset  . Cancer Mother   . Cancer Brother   Mom passed 57 from aneurysm Dad passed 52 from heart failure Two brothers living One brother passed from kidney cancer Fishing/horse back riding are his hobbies, no longer able to do secondary to back disability  ALLERGIES:  has No Known Allergies.  MEDICATIONS:  Current Outpatient Prescriptions  Medication Sig Dispense Refill  . acetaminophen (TYLENOL) 500 MG tablet Take 500 mg by mouth every 6 (six) hours as needed (for pain.).    Marland Kitchen ALPRAZolam (XANAX) 1 MG tablet Take 1 mg by mouth at bedtime.   5  . atorvastatin (LIPITOR) 20 MG tablet Take 20 mg by mouth daily.    . Melatonin 5 MG TABS Take 5 mg by mouth at bedtime as needed (for sleep).    . Multiple Vitamin (MULTIVITAMIN WITH MINERALS) TABS tablet Take 1 tablet by mouth daily.    . nitrofurantoin, macrocrystal-monohydrate, (MACROBID) 100 MG capsule Take 1 capsule (100 mg total) by mouth 2 (two) times daily. (Patient taking differently: Take 100 mg by mouth daily. ) 30 capsule 12  . ondansetron (ZOFRAN) 4 MG tablet     . ondansetron (ZOFRAN-ODT) 4 MG disintegrating tablet Take 1 tablet by mouth every 8 (eight) hours as needed for nausea or vomiting.    0  . Oxycodone HCl 10 MG TABS Take 10 mg by mouth every 6 (six) hours as needed (pain).   0  . polyethylene glycol (MIRALAX / GLYCOLAX) packet Take 17 g by mouth daily.     . Potassium Gluconate 595 MG CAPS Take 595 mg by mouth daily.     No current facility-administered medications for this visit.     Review of Systems  Constitutional: Negative.   HENT: Negative.  Negative for sore throat.   Eyes: Negative.   Respiratory: Negative.   Cardiovascular: Negative.   Gastrointestinal: Positive for diarrhea.  Genitourinary: Negative.   Musculoskeletal: Positive for back pain (chronic).  Skin: Negative.   Neurological: Negative.   Endo/Heme/Allergies: Negative.   Psychiatric/Behavioral: Negative.   All other systems reviewed and are negative. 14 point ROS was done  and is otherwise as detailed above or in HPI   PHYSICAL EXAMINATION: ECOG PERFORMANCE STATUS: 1 - Symptomatic but completely ambulatory  Vitals:   11/04/16 0924  BP: 118/64  Pulse: 77  Resp: 18  Temp: 97.9 F (36.6 C)   Filed Weights   11/04/16 0924  Weight: 155 lb 12.8 oz (70.7 kg)    Physical Exam  Constitutional: He is oriented to person, place, and time and well-developed, well-nourished, and in no distress.  Exam done from wheelchair.   HENT:  Head: Normocephalic and atraumatic.  Nose: Nose normal.  Mouth/Throat: Oropharynx is clear and moist. No oropharyngeal exudate.  Eyes: Conjunctivae and EOM are normal. Pupils are equal, round, and reactive to light. Right eye exhibits no discharge. Left eye exhibits no discharge. No scleral icterus.  Neck: Normal range of motion. Neck supple. No tracheal deviation present. No thyromegaly present.  Cardiovascular: Normal rate, regular rhythm and normal heart sounds.  Exam reveals no gallop and no friction rub.   No murmur heard. Pulmonary/Chest: Effort normal. He has no wheezes. He has no rales.  Abdominal: Soft. Bowel sounds are normal. He exhibits no distension and  no mass. There is no tenderness. There is no rebound and no guarding.  Musculoskeletal: Normal range of motion. He exhibits no edema.  Lymphadenopathy:    He has no cervical adenopathy.  Neurological: He is alert and oriented to person, place, and time. No cranial nerve deficit. Coordination normal.  Skin: Skin is warm and dry. No rash noted.  Psychiatric: Memory, affect and judgment normal.  Nursing note and vitals reviewed.   LABORATORY DATA:  I have reviewed the data as listed Lab Results  Component Value Date   WBC 5.5 09/17/2016   HGB 16.2 09/17/2016   HCT 47.1 09/17/2016   MCV 90.8 09/17/2016   PLT 96 (L) 09/17/2016   CMP     Component Value Date/Time   NA 139 09/17/2016 1054   K 4.4 09/17/2016 1054   CL 105 09/17/2016 1054   CO2 29 09/17/2016 1054   GLUCOSE 131 (H) 09/17/2016 1054   BUN 23 (H) 09/17/2016 1054   CREATININE 1.21 09/17/2016 1054   CALCIUM 10.8 (H) 09/17/2016 1054   CALCIUM 9.7 09/16/2014 0347   PROT 6.7 09/17/2016 1054   ALBUMIN 3.9 09/17/2016 1054   AST 24 09/17/2016 1054   ALT 24 09/17/2016 1054   ALKPHOS 103 09/17/2016 1054   BILITOT 0.8 09/17/2016 1054   GFRNONAA 60 (L) 09/17/2016 1054   GFRAA >60 09/17/2016 1054     RADIOGRAPHIC STUDIES: I have personally reviewed the radiological images as listed and agreed with the findings in the report.  CLINICAL DATA:  Squamous cell carcinoma of the left vocal cord  EXAM: CT CHEST WITH CONTRAST  TECHNIQUE: Multidetector CT imaging of the chest was performed during intravenous contrast administration.  CONTRAST:  191mL ISOVUE-300 IOPAMIDOL (ISOVUE-300) INJECTION 61%  COMPARISON:  None.  FINDINGS: Cardiovascular: The heart size is moderately enlarged. Aortic atherosclerosis noted. Calcifications within the LAD and RCA coronary artery noted. No pericardial effusion.  Mediastinum/Nodes: The trachea is patent and midline. Normal appearance of the esophagus. Sub- carinal node measures 1.3  cm, image 78 of series 2. Right paratracheal lymph node measures 1 cm, image 56 of series 2. Within the right hilar region there is a 1.5 cm lymph node.  Lungs/Pleura: No pleural effusion. Peripheral interstitial reticulation is identified within the right lung. Patchy areas of ground-glass attenuation appear basilar predominant within the right lung.  There is mild bronchiectasis and bronchiolectasis noted. 5 mm right middle lobe pulmonary nodule is identified, image 101 of series 3. Within the right lower lobe there is a 6 mm lung nodule, image 91 of series 3.  Upper Abdomen: No acute abnormality.  Musculoskeletal: No aggressive lytic or sclerotic bone lesion identified.  IMPRESSION: 1. Asymmetric interstitial changes involving the right lobe suggestive of interstitial lung disease. 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 A followup high-resolution CT of the chest is recommended in 6 months to assess for temporal change and provide a more detailed assessment of interstitial changes. 2. Enlarged mediastinal and right hilar lymph nodes. Nodes measure up to 1.5 cm which is considered pathologically enlarged by size criteria. In the setting of interstitial lung disease this is a nonspecific finding and may be reactive. 3. Aortic atherosclerosis, cardiac enlargement and multi vessel coronary artery calcification 4. Small pulmonary nodules are identified in the right lung. The largest measures 6 mm. Attention at follow-up imaging advised.   Electronically Signed   By: Kerby Moors M.D.   On: 08/06/2016 08:53  CLINICAL DATA:  71 y/o M; squamous cell carcinoma of left vocal cord for staging.  EXAM: CT NECK WITH CONTRAST  TECHNIQUE: Multidetector CT imaging of the neck was performed using the standard protocol following the bolus administration of intravenous contrast.  CONTRAST:  151mL ISOVUE-300 IOPAMIDOL (ISOVUE-300) INJECTION  61%  COMPARISON:  None.  FINDINGS: Pharynx and larynx: Irregular enhancement centered within the anterior commissure measuring approximately 15 x 12 x 15 mm (AP x ML x CC), series 7, image 71 and series 9, image 52. On sagittal reconstruction enhancement appears to extend to the anterior supraglottis near the petiole of epiglottis. No subglottic extension identified. There is suspected erosion of the laryngeal cartilage anteriorly at the level of the commissure (series 7, image 71). No extension into the soft tissues B on the larynx is identified. No definite effacement of paraglottic fat at the level of false cords.  Salivary glands: No inflammation, mass, or stone.  Thyroid: Normal.  Lymph nodes: None enlarged or abnormal density.  Vascular: Predominantly fibro fatty plaque of carotid bifurcations with small plaque ulcerations and mild less than 50% stenosis bilaterally. Vertebral arteries are patent. Internal jugular veins are patent.  Limited intracranial: Negative.  Visualized orbits: Negative.  Mastoids and visualized paranasal sinuses: Clear.  Skeleton: Mild cervical spondylosis greatest at the C3-4 and C6-7 levels. No high-grade canal stenosis. Multilevel facet arthropathy. No destructive osseous lesion is identified.  Upper chest: Please refer to the concurrent CT of the chest for further evaluation of lungs and mediastinum.  Other: No additional findings in the neck.  IMPRESSION: Irregular enhancement centered within the anterior commissure of the glottis consistent with history of squamous cell carcinoma. There is probable supraglottic extension along the anterior false cords and suspected laryngeal cartilage erosion anteriorly at the level of of the commissure. No extension beyond the larynx, extension into the subglottic airway, or lymphadenopathy is identified.   Electronically Signed   By: Kristine Garbe M.D.   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 Polycythemia evaluation was completed at his last visit in November and negative for MPD.   He has completed XRT for his L vocal cord malignancy. He is still completing XRT for his prostate cancer.   Major complaints however revolve around his chronic back pain.   He sees Dr. Benjamine Mola on February 1.  He will be due for repeat chest CT in April.  Blood work today to check platelets, patient refused. Will recheck CBC at next visit.   He will return for a follow up 1 week after his appointment with Dr. Benjamine Mola.   Orders Placed This Encounter  Procedures  . CBC with Differential    Standing Status:   Future    Standing Expiration Date:   11/04/2017    All questions were answered. The patient knows to call the clinic with any problems, questions or concerns.  This document serves as a record of services personally performed by Ancil Linsey, MD. It was created on her behalf by Martinique Casey, a trained medical scribe. The creation of this record is based on the scribe's personal observations and the provider's statements to them. This document has been checked and approved by the attending provider.  I have reviewed the above documentation for accuracy and completeness and I agree with the above.  This note was electronically signed.    Molli Hazard, MD  11/04/2016 8:43 PM

## 2016-11-06 DIAGNOSIS — C320 Malignant neoplasm of glottis: Secondary | ICD-10-CM | POA: Diagnosis not present

## 2016-11-06 DIAGNOSIS — C61 Malignant neoplasm of prostate: Secondary | ICD-10-CM | POA: Diagnosis not present

## 2016-11-06 DIAGNOSIS — Z510 Encounter for antineoplastic radiation therapy: Secondary | ICD-10-CM | POA: Diagnosis not present

## 2016-11-07 DIAGNOSIS — Z510 Encounter for antineoplastic radiation therapy: Secondary | ICD-10-CM | POA: Diagnosis not present

## 2016-11-07 DIAGNOSIS — C61 Malignant neoplasm of prostate: Secondary | ICD-10-CM | POA: Diagnosis not present

## 2016-11-07 DIAGNOSIS — C320 Malignant neoplasm of glottis: Secondary | ICD-10-CM | POA: Diagnosis not present

## 2016-11-08 DIAGNOSIS — Z510 Encounter for antineoplastic radiation therapy: Secondary | ICD-10-CM | POA: Diagnosis not present

## 2016-11-08 DIAGNOSIS — C320 Malignant neoplasm of glottis: Secondary | ICD-10-CM | POA: Diagnosis not present

## 2016-11-08 DIAGNOSIS — C61 Malignant neoplasm of prostate: Secondary | ICD-10-CM | POA: Diagnosis not present

## 2016-11-11 DIAGNOSIS — C61 Malignant neoplasm of prostate: Secondary | ICD-10-CM | POA: Diagnosis not present

## 2016-11-11 DIAGNOSIS — Z510 Encounter for antineoplastic radiation therapy: Secondary | ICD-10-CM | POA: Diagnosis not present

## 2016-11-11 DIAGNOSIS — C320 Malignant neoplasm of glottis: Secondary | ICD-10-CM | POA: Diagnosis not present

## 2016-11-12 DIAGNOSIS — C320 Malignant neoplasm of glottis: Secondary | ICD-10-CM | POA: Diagnosis not present

## 2016-11-12 DIAGNOSIS — Z510 Encounter for antineoplastic radiation therapy: Secondary | ICD-10-CM | POA: Diagnosis not present

## 2016-11-12 DIAGNOSIS — C61 Malignant neoplasm of prostate: Secondary | ICD-10-CM | POA: Diagnosis not present

## 2016-11-18 DIAGNOSIS — C320 Malignant neoplasm of glottis: Secondary | ICD-10-CM | POA: Diagnosis not present

## 2016-11-18 DIAGNOSIS — Z510 Encounter for antineoplastic radiation therapy: Secondary | ICD-10-CM | POA: Diagnosis not present

## 2016-11-18 DIAGNOSIS — C61 Malignant neoplasm of prostate: Secondary | ICD-10-CM | POA: Diagnosis not present

## 2016-11-19 DIAGNOSIS — Z510 Encounter for antineoplastic radiation therapy: Secondary | ICD-10-CM | POA: Diagnosis not present

## 2016-11-19 DIAGNOSIS — C61 Malignant neoplasm of prostate: Secondary | ICD-10-CM | POA: Diagnosis not present

## 2016-11-19 DIAGNOSIS — C320 Malignant neoplasm of glottis: Secondary | ICD-10-CM | POA: Diagnosis not present

## 2016-11-20 DIAGNOSIS — C61 Malignant neoplasm of prostate: Secondary | ICD-10-CM | POA: Diagnosis not present

## 2016-11-20 DIAGNOSIS — Z510 Encounter for antineoplastic radiation therapy: Secondary | ICD-10-CM | POA: Diagnosis not present

## 2016-11-20 DIAGNOSIS — C320 Malignant neoplasm of glottis: Secondary | ICD-10-CM | POA: Diagnosis not present

## 2016-11-21 DIAGNOSIS — C61 Malignant neoplasm of prostate: Secondary | ICD-10-CM | POA: Diagnosis not present

## 2016-11-21 DIAGNOSIS — Z510 Encounter for antineoplastic radiation therapy: Secondary | ICD-10-CM | POA: Diagnosis not present

## 2016-11-21 DIAGNOSIS — C320 Malignant neoplasm of glottis: Secondary | ICD-10-CM | POA: Diagnosis not present

## 2016-11-22 DIAGNOSIS — Z510 Encounter for antineoplastic radiation therapy: Secondary | ICD-10-CM | POA: Diagnosis not present

## 2016-11-22 DIAGNOSIS — C320 Malignant neoplasm of glottis: Secondary | ICD-10-CM | POA: Diagnosis not present

## 2016-11-22 DIAGNOSIS — C61 Malignant neoplasm of prostate: Secondary | ICD-10-CM | POA: Diagnosis not present

## 2016-11-28 ENCOUNTER — Ambulatory Visit (INDEPENDENT_AMBULATORY_CARE_PROVIDER_SITE_OTHER): Payer: Medicare Other | Admitting: Otolaryngology

## 2016-11-28 DIAGNOSIS — F17210 Nicotine dependence, cigarettes, uncomplicated: Secondary | ICD-10-CM | POA: Diagnosis not present

## 2016-11-28 DIAGNOSIS — Z8521 Personal history of malignant neoplasm of larynx: Secondary | ICD-10-CM

## 2016-12-06 DIAGNOSIS — C61 Malignant neoplasm of prostate: Secondary | ICD-10-CM | POA: Diagnosis not present

## 2016-12-13 ENCOUNTER — Encounter (HOSPITAL_COMMUNITY): Payer: Medicare Other

## 2016-12-13 ENCOUNTER — Encounter (HOSPITAL_COMMUNITY): Payer: Medicare Other | Attending: Oncology | Admitting: Oncology

## 2016-12-13 ENCOUNTER — Encounter (HOSPITAL_COMMUNITY): Payer: Self-pay | Admitting: Oncology

## 2016-12-13 VITALS — BP 115/79 | HR 74 | Temp 97.6°F | Resp 20 | Wt 155.2 lb

## 2016-12-13 DIAGNOSIS — C320 Malignant neoplasm of glottis: Secondary | ICD-10-CM

## 2016-12-13 DIAGNOSIS — R918 Other nonspecific abnormal finding of lung field: Secondary | ICD-10-CM

## 2016-12-13 DIAGNOSIS — Z720 Tobacco use: Secondary | ICD-10-CM

## 2016-12-13 DIAGNOSIS — D751 Secondary polycythemia: Secondary | ICD-10-CM

## 2016-12-13 DIAGNOSIS — C61 Malignant neoplasm of prostate: Secondary | ICD-10-CM | POA: Diagnosis not present

## 2016-12-13 DIAGNOSIS — D696 Thrombocytopenia, unspecified: Secondary | ICD-10-CM

## 2016-12-13 DIAGNOSIS — R9389 Abnormal findings on diagnostic imaging of other specified body structures: Secondary | ICD-10-CM

## 2016-12-13 LAB — CBC WITH DIFFERENTIAL/PLATELET
BASOS ABS: 0 10*3/uL (ref 0.0–0.1)
BASOS PCT: 1 %
EOS ABS: 0.2 10*3/uL (ref 0.0–0.7)
EOS PCT: 5 %
HCT: 44.8 % (ref 39.0–52.0)
HEMOGLOBIN: 15.6 g/dL (ref 13.0–17.0)
LYMPHS ABS: 0.4 10*3/uL — AB (ref 0.7–4.0)
Lymphocytes Relative: 11 %
MCH: 32.6 pg (ref 26.0–34.0)
MCHC: 34.8 g/dL (ref 30.0–36.0)
MCV: 93.5 fL (ref 78.0–100.0)
Monocytes Absolute: 0.4 10*3/uL (ref 0.1–1.0)
Monocytes Relative: 9 %
NEUTROS PCT: 75 %
Neutro Abs: 3.1 10*3/uL (ref 1.7–7.7)
PLATELETS: 79 10*3/uL — AB (ref 150–400)
RBC: 4.79 MIL/uL (ref 4.22–5.81)
RDW: 13.1 % (ref 11.5–15.5)
WBC: 4.2 10*3/uL (ref 4.0–10.5)

## 2016-12-13 LAB — BASIC METABOLIC PANEL
ANION GAP: 5 (ref 5–15)
BUN: 23 mg/dL — AB (ref 6–20)
CHLORIDE: 106 mmol/L (ref 101–111)
CO2: 27 mmol/L (ref 22–32)
Calcium: 10.6 mg/dL — ABNORMAL HIGH (ref 8.9–10.3)
Creatinine, Ser: 1.21 mg/dL (ref 0.61–1.24)
GFR, EST NON AFRICAN AMERICAN: 59 mL/min — AB (ref >60)
Glucose, Bld: 105 mg/dL — ABNORMAL HIGH (ref 65–99)
POTASSIUM: 5 mmol/L (ref 3.5–5.1)
SODIUM: 138 mmol/L (ref 135–145)

## 2016-12-13 NOTE — Patient Instructions (Addendum)
[place] at [facility] Discharge Instructions  RECOMMENDATIONS MADE BY THE CONSULTANT AND ANY TEST RESULTS WILL BE SENT TO YOUR REFERRING PHYSICIAN.  Labs today look the best that they have.  CT scan of chest in 2-3 weeks on a Friday  Return in 4 weeks to review CT scans  Stay away from the cigarettes  Thank you for choosing [place] at [facility] to provide your oncology and hematology care.  To afford each patient quality time with our provider, please arrive at least 15 minutes before your scheduled appointment time.    If you have a lab appointment with the [facility] please come in thru the  Main Entrance and check in at the main information desk  You need to re-schedule your appointment should you arrive 10 or more minutes late.  We strive to give you quality time with our providers, and arriving late affects you and other patients whose appointments are after yours.  Also, if you no show three or more times for appointments you may be dismissed from the clinic at the providers discretion.     Again, thank you for choosing [facility].  Our hope is that these requests will decrease the amount of time that you wait before being seen by our physicians.       _____________________________________________________________  Should you have questions after your visit to [facility], please contact our office at [phone] between the hours of 8:30 a.m. and 4:30 p.m.  Voicemails left after 4:30 p.m. will not be returned until the following business day.  For prescription refill requests, have your pharmacy contact our office.       Resources For Cancer Patients and their Caregivers ? American Cancer Society: Can assist with transportation, wigs, general needs, runs Look Good Feel Better.        (513)604-5966 ? Cancer Care: Provides financial assistance, online support groups, medication/co-pay  assistance.  1-800-813-HOPE (406)497-4641) ? [place] Assists [place] cancer patients and their families through emotional , educational and financial support.  905 323 3868 ? [place] DSS Where to apply for food stamps, Medicaid and utility assistance. 786-837-7677 ? [facility]: Transportation to medical appointments. 213 286 3103 ? Social Security Administration: May apply for disability if have a Stage IV cancer. (681) 750-1033 209-551-4348 ? [employer], Disability and Transit Services: Assists with nutrition, care and transit needs. [place] Support Programs: @10RELATIVEDAYS @ > Cancer Support Group  2nd Tuesday of the month 1pm-2pm, Journey Room  > Creative Journey  3rd Tuesday of the month 1130am-1pm, Journey Room  > Look Good Feel Better  1st Wednesday of the month 10am-12 noon, Journey Room (Call [place] to register [phone])

## 2016-12-13 NOTE — Progress Notes (Signed)
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 S/P XRT with curative intent finishing in Nov/Dec 2017. 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 There is probable supraglottic extension along the anterior false cords and suspected laryngeal cartilage erosion anteriorly at the level of of the commissure. No extension beyond the larynx, extension into the subglottic airway, or lymphadenopathy is identified.      08/06/2016 Imaging    CT chest- 1. Asymmetric interstitial changes involving the right lobe suggestive of interstitial lung disease.  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 A followup high-resolution CT of the chest is recommended in 6 months to assess for temporal change and provide a more detailed assessment of interstitial changes. 2. Enlarged mediastinal and right hilar lymph nodes. Nodes measure up to 1.5 cm which is considered pathologically enlarged by size criteria. In the setting of interstitial lung disease this is a nonspecific finding and may be reactive. 3. Aortic atherosclerosis, cardiac enlargement and multi vessel coronary artery calcification 4. Small pulmonary nodules are identified in the right lung. The largest measures 6 mm. Attention at follow-up imaging advised.      08/15/2016 - 09/26/2016 Radiation Therapy    [name], [place]       Prostate cancer ([place])   08/11/2016 Initial Diagnosis    Prostate cancer ([place])     09/23/2016 - 11/22/2016 Radiation Therapy    [name], [place]       He continues to have chronic low back pain.  This is not oncology related and I will defer to his primary care physician.  He notes an improvement in his appetite.  He completed radiation approximately 3 weeks ago and he notes an overall improvement in his well-being.  Weight is very stable.  Smoking on February 1.  He denies any nausea or vomiting.  He denies any issues with dysphagia.  Review of Systems  Constitutional: Negative.  Negative for chills, fever, malaise/fatigue and weight loss.  HENT: Negative.  Negative for sore throat.   Eyes: Negative.   Respiratory: Negative.  Negative for cough.   Cardiovascular: Negative.  Negative for chest pain.  Gastrointestinal: Negative.  Negative for constipation, diarrhea, nausea and vomiting.  Genitourinary: Negative.   Musculoskeletal: Positive for back pain (chronic).  Skin: Negative.   Neurological: Negative.  Negative for weakness.  Endo/Heme/Allergies: Negative.   Psychiatric/Behavioral: Negative.     Past Medical History:    Diagnosis Date  . Arthritis   . Chronic back pain   . COPD (chronic obstructive pulmonary disease) ([place])   . Hypercalcemia   . Kidney stones   . Myocardial infarction   . 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 Prostate cancer ([place]) 08/11/2016  . Sepsis ([place])   . 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 UTI (lower urinary tract infection)     Past Surgical History:  Procedure Laterality Date  . APPENDECTOMY    . BACK SURGERY     10/2013  . GOLD SEED IMPLANT N/A 08/21/2016   Procedure: GOLD SEED IMPLANT;  Surgeon: Cleon Gustin, MD;  Location: [facility];  Service: Urology;  Laterality: N/A;  . HERNIA REPAIR    . MICROLARYNGOSCOPY Bilateral 07/23/2016   Procedure: MICRO DIRECT LARYNGOSCOPY WITH BIOPSY;  Surgeon: Leta Baptist, MD;  Location: [place];  Service: ENT;  Laterality: Bilateral;  . NEPHRECTOMY Left    04/2014  . PROSTATE BIOPSY N/A 06/05/2016   Procedure: PROSTATE BIOPSY;  Surgeon: Cleon Gustin, MD;  Location: [facility];  Service: Urology;  Laterality: N/A;  . TRANSRECTAL ULTRASOUND N/A 08/21/2016   Procedure: TRANSRECTAL ULTRASOUND;  Surgeon: Cleon Gustin, MD;  Location: [facility];  Service: Urology;  Laterality: N/A;  . vocal cord biopsy  06/2016    Family History  Problem Relation Age of Onset  . Cancer Mother   . Cancer Brother     Social History   Social History  . Marital status: Married    Spouse name: N/A  . Number of children: N/A  . Years of education: N/A   Social History Main Topics  . Smoking status: Current Every Day Smoker    Packs/day: 1.00    Years: 50.00    Types: Cigarettes  . Smokeless tobacco: Never Used  . Alcohol use No  . Drug use: No  . Sexual activity: Yes   Other Topics Concern  . Not on file   Social History Narrative  . No narrative on file     PHYSICAL EXAMINATION  ECOG PERFORMANCE STATUS: 2 - Symptomatic, <50% confined to bed  Vitals:   12/13/16 1049 12/13/16 1112  BP: (!)  146/102 115/79  Pulse: 74   Resp: 20   Temp: 97.6 F (36.4 C)     GENERAL:alert, no distress, chronically ill looking and accompanied by his daughter.  SKIN: skin color, texture, turgor are normal, no rashes or significant lesions, pale HEAD: Normocephalic, No masses, lesions, tenderness or abnormalities EYES: normal, Conjunctiva are pink and non-injected EARS: External ears normal OROPHARYNX:no exudate, no erythema and lips, buccal mucosa, and tongue normal  NECK: supple, trachea midline, erythematous at treatment site LYMPH:  no palpable lymphadenopathy BREAST:not examined LUNGS: clear to auscultation and percussion, decreased breath sounds bilaterally HEART: regular rate & rhythm without murmur, rub, or gallop. ABDOMEN:abdomen soft and normal bowel sounds BACK: Back symmetric, no curvature. EXTREMITIES:less then 2 second capillary refill, no joint deformities, effusion, or inflammation, no skin discoloration, no cyanosis  NEURO: alert & oriented x 3 with fluent speech, in wheelchair.   LABORATORY DATA: CBC    Component Value Date/Time   WBC 4.2 12/13/2016 1036  RBC 4.79 12/13/2016 1036   HGB 15.6 12/13/2016 1036   HCT 44.8 12/13/2016 1036   PLT PENDING 12/13/2016 1036   MCV 93.5 12/13/2016 1036   MCH 32.6 12/13/2016 1036   MCHC 34.8 12/13/2016 1036   RDW 13.1 12/13/2016 1036   LYMPHSABS 0.4 (L) 12/13/2016 1036   MONOABS 0.4 12/13/2016 1036   EOSABS 0.2 12/13/2016 1036   BASOSABS 0.0 12/13/2016 1036      Chemistry      Component Value Date/Time   NA 138 12/13/2016 1036   K 5.0 12/13/2016 1036   CL 106 12/13/2016 1036   CO2 27 12/13/2016 1036   BUN 23 (H) 12/13/2016 1036   CREATININE 1.21 12/13/2016 1036      Component Value Date/Time   CALCIUM 10.6 (H) 12/13/2016 1036   CALCIUM 9.7 09/16/2014 0347   ALKPHOS 103 09/17/2016 1054   AST 24 09/17/2016 1054   ALT 24 09/17/2016 1054   BILITOT 0.8 09/17/2016 1054        PENDING LABS:   RADIOGRAPHIC  STUDIES:  No results found.   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  Oncology history is updated.  Labs today: CBC diff, BMET.  I personally reviewed and went over laboratory results with the patient.  The results are noted within this dictation.  Platelet count is pending at this time.  WBC, RBC, and HGB are WNL.  BMET is stable as well.  October 2017 CT of chest for staging for head and neck cancer demonstrated some abnormalities that require follow-up: 1. Asymmetric interstitial changes involving the right lobe suggestive of interstitial lung disease. 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 A followup high-resolution CT of the chest is recommended in 6 months to assess for temporal change and provide a more detailed assessment of interstitial changes. 2. Enlarged mediastinal and right hilar lymph nodes. Nodes measure up to 1.5 cm which is considered pathologically enlarged by size criteria. In the setting of interstitial lung disease this is a nonspecific finding and may be reactive. 3. Small pulmonary nodules are identified in the right lung. The largest measures 6 mm. Attention at follow-up imaging advised. I personally reviewed and went over radiographic studies with the patient.  The results are noted within this dictation.    Order is placed for CT chest high-resolution to be completed within the next 3 weeks.  Response evaluation with PET scan following CT chest.  I would like to confirm resolution of abnormal CT imaging findings from October prior to pursuing PET scan for response evaluation.  Of course, if repeat CT imaging is concerning for malignancy, PET scan will also be pursued.   Smoking cessation education is provided.  Return in 4 weeks for follow-up and review imaging  information.  At this time, PET can be ordered and scheduled.  He would be a good candidate for referral into the [place] here at [facility].  Prostate cancer [facility]) Prostate cancer, S/P definitive XRT finishing in Jan 2018  Polycythemia, secondary Secondary polycythemia from ongoing tobacco abuse with negative work-up for MPD.  Smoking cessation education provided.   ORDERS PLACED FOR THIS ENCOUNTER: Orders Placed This Encounter  Procedures  . CT Chest High Resolution  . Basic metabolic panel    MEDICATIONS PRESCRIBED THIS ENCOUNTER: No orders of the defined types were placed  in this encounter.   THERAPY PLAN:  Repeat CT chest imaging followed by PET imaging to evaluate response to treatment head and neck cancer.  All questions were answered. The patient knows to call the clinic with any problems, questions or concerns. We can certainly see the patient much sooner if necessary.  Patient and plan discussed with Dr. Twana First and she is in agreement with the aforementioned.   This note is electronically signed by: Doy Mince 12/13/2016 11:31 AM

## 2016-12-13 NOTE — Assessment & Plan Note (Addendum)
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  Oncology history is updated.  Labs today: CBC diff, BMET.  I personally reviewed and went over laboratory results with the patient.  The results are noted within this dictation.  Platelet count is pending at this time.  WBC, RBC, and HGB are WNL.  BMET is stable as well.  October 2017 CT of chest for staging for head and neck cancer demonstrated some abnormalities that require follow-up: 1. Asymmetric interstitial changes involving the right lobe suggestive of interstitial lung disease. 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 A followup high-resolution CT of the chest is recommended in 6 months to assess for temporal change and provide a more detailed assessment of interstitial changes. 2. Enlarged mediastinal and right hilar lymph nodes. Nodes measure up to 1.5 cm which is considered pathologically enlarged by size criteria. In the setting of interstitial lung disease this is a nonspecific finding and may be reactive. 3. Small pulmonary nodules are identified in the right lung. The largest measures 6 mm. Attention at follow-up imaging advised. I personally reviewed and went over radiographic studies with the patient.  The results are noted within this dictation.    Order is placed for CT chest high-resolution to be completed within the next 3 weeks.  Response evaluation with PET scan following CT chest.  I would like to confirm resolution of abnormal CT imaging findings from October prior to pursuing PET scan for response evaluation.  Of course, if repeat CT imaging is concerning for malignancy, PET scan will also be pursued.   Smoking cessation education is provided.  Return in 4 weeks for follow-up and review imaging information.  At this time, PET can be ordered and scheduled.  He would be a good candidate for referral into the [place] here at [facility].

## 2016-12-13 NOTE — Assessment & Plan Note (Signed)
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  Smoking cessation education provided.

## 2016-12-13 NOTE — Assessment & Plan Note (Signed)
Prostate cancer, S/P definitive XRT finishing in Jan 2018

## 2016-12-18 DIAGNOSIS — Z923 Personal history of irradiation: Secondary | ICD-10-CM | POA: Diagnosis not present

## 2016-12-18 DIAGNOSIS — C320 Malignant neoplasm of glottis: Secondary | ICD-10-CM | POA: Diagnosis not present

## 2016-12-18 DIAGNOSIS — C61 Malignant neoplasm of prostate: Secondary | ICD-10-CM | POA: Diagnosis not present

## 2016-12-25 ENCOUNTER — Ambulatory Visit (INDEPENDENT_AMBULATORY_CARE_PROVIDER_SITE_OTHER): Payer: Medicare Other | Admitting: Urology

## 2016-12-25 DIAGNOSIS — C61 Malignant neoplasm of prostate: Secondary | ICD-10-CM | POA: Diagnosis not present

## 2016-12-27 ENCOUNTER — Ambulatory Visit (HOSPITAL_COMMUNITY)
Admission: RE | Admit: 2016-12-27 | Discharge: 2016-12-27 | Disposition: A | Discharge status: Home / Self Care | Payer: Medicare Other | Source: Ambulatory Visit | Attending: Oncology | Admitting: Oncology

## 2016-12-27 DIAGNOSIS — I2510 Atherosclerotic heart disease of native coronary artery without angina pectoris: Secondary | ICD-10-CM | POA: Insufficient documentation

## 2016-12-27 DIAGNOSIS — R918 Other nonspecific abnormal finding of lung field: Secondary | ICD-10-CM | POA: Diagnosis not present

## 2016-12-27 DIAGNOSIS — C61 Malignant neoplasm of prostate: Secondary | ICD-10-CM | POA: Diagnosis not present

## 2016-12-27 DIAGNOSIS — J849 Interstitial pulmonary disease, unspecified: Secondary | ICD-10-CM | POA: Diagnosis not present

## 2016-12-27 DIAGNOSIS — R590 Localized enlarged lymph nodes: Secondary | ICD-10-CM | POA: Diagnosis not present

## 2016-12-27 DIAGNOSIS — J479 Bronchiectasis, uncomplicated: Secondary | ICD-10-CM | POA: Insufficient documentation

## 2016-12-27 DIAGNOSIS — Z720 Tobacco use: Secondary | ICD-10-CM | POA: Diagnosis not present

## 2016-12-27 DIAGNOSIS — C320 Malignant neoplasm of glottis: Secondary | ICD-10-CM | POA: Diagnosis not present

## 2016-12-27 DIAGNOSIS — I700 Atherosclerosis of aorta: Secondary | ICD-10-CM | POA: Diagnosis not present

## 2016-12-27 DIAGNOSIS — R9389 Abnormal findings on diagnostic imaging of other specified body structures: Secondary | ICD-10-CM

## 2017-01-10 ENCOUNTER — Encounter (HOSPITAL_COMMUNITY): Payer: Medicare Other | Attending: Hematology & Oncology | Admitting: Hematology

## 2017-01-10 VITALS — BP 111/72 | HR 109 | Temp 98.5°F | Resp 20 | Wt 158.9 lb

## 2017-01-10 DIAGNOSIS — C61 Malignant neoplasm of prostate: Secondary | ICD-10-CM | POA: Diagnosis not present

## 2017-01-10 DIAGNOSIS — C320 Malignant neoplasm of glottis: Secondary | ICD-10-CM | POA: Insufficient documentation

## 2017-01-10 DIAGNOSIS — Z720 Tobacco use: Secondary | ICD-10-CM

## 2017-01-10 DIAGNOSIS — D751 Secondary polycythemia: Secondary | ICD-10-CM | POA: Insufficient documentation

## 2017-01-10 NOTE — Patient Instructions (Addendum)
[place] at [facility] Discharge Instructions  RECOMMENDATIONS MADE BY THE CONSULTANT AND ANY TEST RESULTS WILL BE SENT TO YOUR REFERRING PHYSICIAN.  You were seen today by Dr. Irene Limbo Follow up in 2 months after scans  Thank you for choosing [place] at [facility] to provide your oncology and hematology care.  To afford each patient quality time with our provider, please arrive at least 15 minutes before your scheduled appointment time.    If you have a lab appointment with the [facility] please come in thru the  Main Entrance and check in at the main information desk  You need to re-schedule your appointment should you arrive 10 or more minutes late.  We strive to give you quality time with our providers, and arriving late affects you and other patients whose appointments are after yours.  Also, if you no show three or more times for appointments you may be dismissed from the clinic at the providers discretion.     Again, thank you for choosing [facility].  Our hope is that these requests will decrease the amount of time that you wait before being seen by our physicians.       _____________________________________________________________  Should you have questions after your visit to [facility], please contact our office at [phone] between the hours of 8:30 a.m. and 4:30 p.m.  Voicemails left after 4:30 p.m. will not be returned until the following business day.  For prescription refill requests, have your pharmacy contact our office.       Resources For Cancer Patients and their Caregivers ? American Cancer Society: Can assist with transportation, wigs, general needs, runs Look Good Feel Better.        438-851-8578 ? Cancer Care: Provides financial assistance, online support groups, medication/co-pay assistance.  1-800-813-HOPE (386)684-2446) ? [facility] [place]  cancer patients and their families through emotional , educational and financial support.  (289)111-7113 ? [place] DSS Where to apply for food stamps, Medicaid and utility assistance. (567)215-0033 ? [facility]: Transportation to medical appointments. 312-612-3011 ? Social Security Administration: May apply for disability if have a Stage IV cancer. 256-163-8954 223-547-0195 ? [employer], Disability and Transit Services: Assists with nutrition, care and transit needs. [place] Support Programs: @10RELATIVEDAYS @ > Cancer Support Group  2nd Tuesday of the month 1pm-2pm, Journey Room  > Creative Journey  3rd Tuesday of the month 1130am-1pm, Journey Room  > Look Good Feel Better  1st Wednesday of the month 10am-12 noon, Journey Room (Call [place] to register [phone])

## 2017-01-11 NOTE — Progress Notes (Signed)
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 There is probable supraglottic extension along the anterior false cords and suspected laryngeal cartilage erosion anteriorly at the level of of the commissure. No extension beyond the larynx, extension into the subglottic airway, or lymphadenopathy is identified.      08/06/2016 Imaging    CT chest- 1. Asymmetric interstitial changes involving the right lobe suggestive of interstitial lung disease. 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 A followup high-resolution CT of the chest is recommended in 6 months to assess for temporal change and provide a more detailed assessment of interstitial changes. 2. Enlarged mediastinal and right hilar lymph nodes. Nodes measure up to 1.5 cm which is considered pathologically enlarged by size criteria. In the setting of interstitial lung disease this is a nonspecific finding and may be reactive. 3. Aortic atherosclerosis, cardiac enlargement and multi vessel coronary artery calcification 4. Small pulmonary nodules are identified in the right lung. The largest measures 6 mm. Attention at follow-up imaging  advised.      08/15/2016 - 09/26/2016 Radiation Therapy    [name], [place]      12/27/2016 Imaging    CT chest high resolution- 1. Stable scattered pulmonary nodules, largest 6 mm, for which 4 month stability has been demonstrated. Recommend continued attention on follow-up chest CT in 6 months. 2. Interval stability of interstitial lung disease asymmetrically involving the right lung characterized by patchy subpleural reticulation and ground-glass attenuation with associated mild traction bronchiolectasis, no frank honeycombing and no clear basilar gradient. Findings are not appreciably changed at the lung bases compared to a 2015 CT abdomen study. 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 Follow-up high-resolution chest CT in 12 months recommended to ensure continued temporal pattern stability. 3. Stable mild mediastinal lymphadenopathy, most compatible with benign reactive adenopathy. 4. Aortic atherosclerosis. Left main and 3 vessel coronary atherosclerosis.       Prostate cancer ([place])   08/11/2016 Initial Diagnosis    Prostate cancer ([place])     09/23/2016 - 11/22/2016 Radiation Therapy    [name], [place]       INTERVAL HISTORY:  Patient is here for his scheduled follow-up for squamous cell carcinoma of the vocal cords and to review his repeat high-resolution CT scan of the chest to follow up on previous lung abnormalities.  He reports that he has completely quit smoking and is eating better. Breathing is stable. No dysphagia or new changes in voice. No new cough. HR CT of the chest showed scattered pulmonary nodules which are stable. Interval stability of interstitial lung disease asymmetrically involving the right lung characterized by patchy subpleural reticulation and ground-glass attenuation with associated mild traction bronchiolectasis, no frank honeycombing and no clear  basilar gradient. Findings are not appreciably changed at the  lung bases compared to a 2015 CT abdomen study. 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  REVIEW OF SYSTEMS:    10 Point review of systems of done and is negative except as noted above.  . Past Medical History:  Diagnosis Date  . Arthritis   . Chronic back pain   . COPD (chronic obstructive pulmonary disease) ([place])   . Hypercalcemia   . Kidney stones   . Myocardial infarction   . 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 Prostate cancer ([place]) 08/11/2016  . Sepsis ([place])   . 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 UTI (lower urinary tract infection)     . Past Surgical History:  Procedure Laterality Date  . APPENDECTOMY    . BACK SURGERY     10/2013  . GOLD SEED IMPLANT N/A 08/21/2016   Procedure: GOLD SEED IMPLANT;  Surgeon: Cleon Gustin, MD;  Location: [facility];  Service: Urology;  Laterality: N/A;  . HERNIA REPAIR    . MICROLARYNGOSCOPY Bilateral 07/23/2016   Procedure: MICRO DIRECT LARYNGOSCOPY WITH BIOPSY;  Surgeon: Leta Baptist, MD;  Location: [facility];  Service: ENT;  Laterality: Bilateral;  . NEPHRECTOMY Left    04/2014  . PROSTATE BIOPSY N/A 06/05/2016   Procedure: PROSTATE BIOPSY;  Surgeon: Cleon Gustin, MD;  Location: [facility];  Service: Urology;  Laterality: N/A;  . TRANSRECTAL ULTRASOUND N/A 08/21/2016   Procedure: TRANSRECTAL ULTRASOUND;  Surgeon: Cleon Gustin, MD;  Location: [facility];  Service: Urology;  Laterality: N/A;  . vocal cord biopsy  06/2016    . Social History  Substance Use Topics  . Smoking status: Current Every Day Smoker    Packs/day: 1.00    Years: 50.00    Types: Cigarettes  . Smokeless tobacco: Never Used  . Alcohol use No    ALLERGIES:  has No Known Allergies.  MEDICATIONS:  Current Outpatient Prescriptions  Medication Sig Dispense Refill  . acetaminophen  (TYLENOL) 500 MG tablet Take 500 mg by mouth every 6 (six) hours as needed (for pain.).    Marland Kitchen ALPRAZolam (XANAX) 1 MG tablet Take 1 mg by mouth at bedtime.   5  . atorvastatin (LIPITOR) 20 MG tablet Take 20 mg by mouth daily.    . Melatonin 5 MG TABS Take 5 mg by mouth at bedtime as needed (for sleep).    . Multiple Vitamin (MULTIVITAMIN WITH MINERALS) TABS tablet Take 1 tablet by mouth daily.    . nitrofurantoin, macrocrystal-monohydrate, (MACROBID) 100 MG capsule Take 1 capsule (100 mg total) by mouth 2 (two) times daily. (Patient taking differently: Take 100 mg by mouth daily. ) 30 capsule 12  . ondansetron (ZOFRAN) 4 MG tablet     . ondansetron (ZOFRAN-ODT) 4 MG disintegrating tablet Take 1 tablet by mouth every 8 (eight) hours as needed for nausea or vomiting.   0  . Oxycodone HCl 10 MG TABS Take 10 mg by mouth every 6 (six) hours as needed (pain).   0  . polyethylene glycol (MIRALAX / GLYCOLAX) packet Take 17 g by mouth daily.     . Potassium Gluconate 595 MG CAPS Take 595 mg by mouth daily.     No current facility-administered medications for this visit.     PHYSICAL EXAMINATION: ECOG PERFORMANCE STATUS: 2 - Symptomatic, <50% confined to bed  . Vitals:  01/10/17 1100  BP: 111/72  Pulse: (!) 109  Resp: 20  Temp: 98.5 F (36.9 C)    Filed Weights   01/10/17 1100  Weight: 158 lb 14.4 oz (72.1 kg)   .Body mass index is 23.47 kg/m.  GENERAL:alert, in no acute distress and comfortable SKIN: no acute rashes, no significant lesions EYES: conjunctiva are pink and non-injected, sclera anicteric OROPHARYNX: MMM, no exudates, no oropharyngeal erythema or ulceration NECK: supple, no JVD LYMPH:  no palpable lymphadenopathy in the cervical, axillary or inguinal regions LUNGS: clear to auscultation b/l with normal respiratory effort HEART: regular rate & rhythm ABDOMEN:  normoactive bowel sounds , non tender, not distended. Extremity: no pedal edema PSYCH: alert & oriented x 3  with fluent speech NEURO: no focal motor/sensory deficits  LABORATORY DATA:   I have reviewed the data as listed  . CBC Latest Ref Rng & Units 12/13/2016 09/17/2016 08/16/2016  WBC 4.0 - 10.5 K/uL 4.2 5.5 7.3  Hemoglobin 13.0 - 17.0 g/dL 15.6 16.2 18.3(H)  Hematocrit 39.0 - 52.0 % 44.8 47.1 52.5(H)  Platelets 150 - 400 K/uL 79(L) 96(L) 115(L)    . CMP Latest Ref Rng & Units 12/13/2016 09/17/2016 08/16/2016  Glucose 65 - 99 mg/dL 105(H) 131(H) 113(H)  BUN 6 - 20 mg/dL 23(H) 23(H) 25(H)  Creatinine 0.61 - 1.24 mg/dL 1.21 1.21 1.27(H)  Sodium 135 - 145 mmol/L 138 139 138  Potassium 3.5 - 5.1 mmol/L 5.0 4.4 4.5  Chloride 101 - 111 mmol/L 106 105 103  CO2 22 - 32 mmol/L 27 29 28   Calcium 8.9 - 10.3 mg/dL 10.6(H) 10.8(H) 11.5(H)  Total Protein 6.5 - 8.1 g/dL - 6.7 -  Total Bilirubin 0.3 - 1.2 mg/dL - 0.8 -  Alkaline Phos 38 - 126 U/L - 103 -  AST 15 - 41 U/L - 24 -  ALT 17 - 63 U/L - 24 -     RADIOGRAPHIC STUDIES: I have personally reviewed the radiological images as listed and agreed with the findings in the report. Ct Chest High Resolution  Result Date: 12/27/2016 CLINICAL DATA:  History of left glottic squamous cell carcinoma diagnosed September 2017 status post radiation therapy completed 09/26/2016. History of prostate cancer diagnosed October 2017 status post radiation therapy completed 11/22/2016. Restaging. Follow-up interstitial lung disease. EXAM: CT CHEST WITHOUT CONTRAST TECHNIQUE: Multidetector CT imaging of the chest was performed following the standard protocol without intravenous contrast. High resolution imaging of the lungs, as well as inspiratory and expiratory imaging, was performed. COMPARISON:  08/05/2016 chest CT. FINDINGS: Cardiovascular: Normal heart size. No significant pericardial fluid/thickening. Left main, left anterior descending, left circumflex and right coronary atherosclerosis. Atherosclerotic nonaneurysmal thoracic aorta. Normal caliber pulmonary  arteries. Mediastinum/Nodes: No discrete thyroid nodules. Unremarkable esophagus. No axillary adenopathy. Stable mild right paratracheal adenopathy measuring up to 1.0 cm (series 3/ image 60). Stable mildly enlarged 1.3 cm right subcarinal node (series 3/ image 76). Stable mildly enlarged 1.3 cm right hilar node (series 3/ image 68). No new pathologically enlarged mediastinal or gross hilar nodes on this noncontrast scan. Lungs/Pleura: No pneumothorax. No pleural effusion. No acute consolidative airspace disease or lung masses. There are several (at least 6) scattered solid pulmonary nodules in both lungs, largest 6 mm in the peripheral right lower lobe (series 7/ image 88) and 5 mm in the peripheral right middle lobe (series 7/ image 100), all stable back to the 08/05/2016. No new significant pulmonary nodules. Patchy subpleural reticulation and ground-glass attenuation in both lungs, asymmetrically involving  the right lung, with associated mild traction bronchiolectasis, without a clear basilar gradient, without frank honeycombing, not appreciably changed since 08/05/2016 and not appreciably change at the lung bases since 04/07/2014 CT study. No significant air trapping on the expiration sequence. Upper abdomen: Unremarkable. Musculoskeletal: No aggressive appearing focal osseous lesions. Mild thoracic spondylosis. IMPRESSION: 1. Stable scattered pulmonary nodules, largest 6 mm, for which 4 month stability has been demonstrated. Recommend continued attention on follow-up chest CT in 6 months. 2. Interval stability of interstitial lung disease asymmetrically involving the right lung characterized by patchy subpleural reticulation and ground-glass attenuation with associated mild traction bronchiolectasis, no frank honeycombing and no clear basilar gradient. Findings are not appreciably changed at the lung bases compared to a 2015 CT abdomen study. Findings are most suggestive of fibrotic nonspecific interstitial  pneumonia (NSIP). Follow-up high-resolution chest CT in 12 months recommended to ensure continued temporal pattern stability. 3. Stable mild mediastinal lymphadenopathy, most compatible with benign reactive adenopathy. 4. Aortic atherosclerosis. Left main and 3 vessel coronary atherosclerosis. Electronically Signed   By: Ilona Sorrel M.D.   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 Plan -Patient had a CT chest of normality which was followed up with a repeat HR CT of the chest that shows stable pulmonary nodules and signs of interstitial pneumonitis. -He has quit smoking since his last visit and was encouraged to continue doing so -No other acute new symptoms suggestive of recurrent/progressive vocal cord carcinoma . -Continue ENT evaluation as per ENT physician  -Repeat PET CT scan skull base to midthigh to reevaluate status of head and neck cancer and prostate cancer .  Prostate cancer [facility]) Prostate cancer, S/P definitive XRT finishing in Jan 2018. No hematuria and no new urinary symptoms Plan  -PSA on next visit . -PET/CT scan prior to next visit given some mild hypercalcemia which appears to be improving .  Polycythemia, secondary Secondary polycythemia secondary to smoking cessation. -This has now resolved with normal hemoglobin and hematocrit today as a result of smoking cessation. Continue smoking abstinence.  Return to clinic in 2 months with labs and PET/CT scan  I spent 20 minutes counseling the patient face to face. The total time spent in the appointment was 30 minutes and more than 50% was on counseling and direct patient cares.    Sullivan Lone MD [place] AAHIVMS [facility] Hematology/Oncology Physician [facility]  (Office):       [phone] (Work cell):  [phone] (Fax):            [phone]

## 2017-01-17 DIAGNOSIS — N189 Chronic kidney disease, unspecified: Secondary | ICD-10-CM | POA: Diagnosis not present

## 2017-01-17 DIAGNOSIS — C329 Malignant neoplasm of larynx, unspecified: Secondary | ICD-10-CM | POA: Diagnosis not present

## 2017-01-17 DIAGNOSIS — M545 Low back pain: Secondary | ICD-10-CM | POA: Diagnosis not present

## 2017-01-17 DIAGNOSIS — F419 Anxiety disorder, unspecified: Secondary | ICD-10-CM | POA: Diagnosis not present

## 2017-01-31 ENCOUNTER — Ambulatory Visit (HOSPITAL_COMMUNITY): Payer: Medicare Other

## 2017-03-07 DIAGNOSIS — C61 Malignant neoplasm of prostate: Secondary | ICD-10-CM | POA: Diagnosis not present

## 2017-03-10 ENCOUNTER — Ambulatory Visit (HOSPITAL_COMMUNITY)
Admission: RE | Admit: 2017-03-10 | Discharge: 2017-03-10 | Disposition: A | Discharge status: Home / Self Care | Payer: Medicare Other | Source: Ambulatory Visit | Attending: Hematology | Admitting: Hematology

## 2017-03-10 DIAGNOSIS — I2510 Atherosclerotic heart disease of native coronary artery without angina pectoris: Secondary | ICD-10-CM | POA: Insufficient documentation

## 2017-03-10 DIAGNOSIS — C61 Malignant neoplasm of prostate: Secondary | ICD-10-CM | POA: Diagnosis not present

## 2017-03-10 DIAGNOSIS — C329 Malignant neoplasm of larynx, unspecified: Secondary | ICD-10-CM | POA: Diagnosis not present

## 2017-03-10 DIAGNOSIS — I700 Atherosclerosis of aorta: Secondary | ICD-10-CM | POA: Insufficient documentation

## 2017-03-10 DIAGNOSIS — I714 Abdominal aortic aneurysm, without rupture: Secondary | ICD-10-CM | POA: Diagnosis not present

## 2017-03-10 DIAGNOSIS — Z905 Acquired absence of kidney: Secondary | ICD-10-CM | POA: Diagnosis not present

## 2017-03-10 DIAGNOSIS — C320 Malignant neoplasm of glottis: Secondary | ICD-10-CM | POA: Diagnosis not present

## 2017-03-10 LAB — GLUCOSE, CAPILLARY: Glucose-Capillary: 93 mg/dL (ref 65–99)

## 2017-03-10 MED ORDER — FLUDEOXYGLUCOSE F - 18 (FDG) INJECTION
7.3700 | Freq: Once | INTRAVENOUS | Status: AC | PRN
  Administered 2017-03-10: 7.37 via INTRAVENOUS

## 2017-03-12 ENCOUNTER — Encounter (HOSPITAL_COMMUNITY): Payer: Medicare Other

## 2017-03-12 ENCOUNTER — Encounter (HOSPITAL_COMMUNITY): Payer: Medicare Other | Attending: Oncology | Admitting: Oncology

## 2017-03-12 ENCOUNTER — Encounter (HOSPITAL_COMMUNITY): Payer: Self-pay

## 2017-03-12 VITALS — BP 129/73 | HR 112 | Temp 98.4°F | Resp 16 | Wt 160.0 lb

## 2017-03-12 DIAGNOSIS — M549 Dorsalgia, unspecified: Secondary | ICD-10-CM | POA: Diagnosis not present

## 2017-03-12 DIAGNOSIS — G8929 Other chronic pain: Secondary | ICD-10-CM | POA: Insufficient documentation

## 2017-03-12 DIAGNOSIS — I214 Non-ST elevation (NSTEMI) myocardial infarction: Secondary | ICD-10-CM | POA: Diagnosis not present

## 2017-03-12 DIAGNOSIS — R918 Other nonspecific abnormal finding of lung field: Secondary | ICD-10-CM | POA: Insufficient documentation

## 2017-03-12 DIAGNOSIS — J449 Chronic obstructive pulmonary disease, unspecified: Secondary | ICD-10-CM | POA: Diagnosis not present

## 2017-03-12 DIAGNOSIS — Z87442 Personal history of urinary calculi: Secondary | ICD-10-CM | POA: Insufficient documentation

## 2017-03-12 DIAGNOSIS — F17210 Nicotine dependence, cigarettes, uncomplicated: Secondary | ICD-10-CM | POA: Diagnosis not present

## 2017-03-12 DIAGNOSIS — C320 Malignant neoplasm of glottis: Secondary | ICD-10-CM | POA: Diagnosis not present

## 2017-03-12 DIAGNOSIS — I714 Abdominal aortic aneurysm, without rupture: Secondary | ICD-10-CM | POA: Diagnosis not present

## 2017-03-12 DIAGNOSIS — D751 Secondary polycythemia: Secondary | ICD-10-CM | POA: Diagnosis not present

## 2017-03-12 DIAGNOSIS — C61 Malignant neoplasm of prostate: Secondary | ICD-10-CM

## 2017-03-12 DIAGNOSIS — N179 Acute kidney failure, unspecified: Secondary | ICD-10-CM | POA: Insufficient documentation

## 2017-03-12 LAB — CBC WITH DIFFERENTIAL/PLATELET
Basophils Absolute: 0 10*3/uL (ref 0.0–0.1)
Basophils Relative: 1 %
EOS ABS: 0.1 10*3/uL (ref 0.0–0.7)
Eosinophils Relative: 3 %
HCT: 44.7 % (ref 39.0–52.0)
HEMOGLOBIN: 15.3 g/dL (ref 13.0–17.0)
LYMPHS ABS: 0.4 10*3/uL — AB (ref 0.7–4.0)
LYMPHS PCT: 9 %
MCH: 32 pg (ref 26.0–34.0)
MCHC: 34.2 g/dL (ref 30.0–36.0)
MCV: 93.5 fL (ref 78.0–100.0)
Monocytes Absolute: 0.5 10*3/uL (ref 0.1–1.0)
Monocytes Relative: 10 %
NEUTROS PCT: 78 %
Neutro Abs: 3.7 10*3/uL (ref 1.7–7.7)
Platelets: 92 10*3/uL — ABNORMAL LOW (ref 150–400)
RBC: 4.78 MIL/uL (ref 4.22–5.81)
RDW: 12.9 % (ref 11.5–15.5)
WBC: 4.7 10*3/uL (ref 4.0–10.5)

## 2017-03-12 LAB — COMPREHENSIVE METABOLIC PANEL
ALK PHOS: 102 U/L (ref 38–126)
ALT: 23 U/L (ref 17–63)
AST: 26 U/L (ref 15–41)
Albumin: 3.9 g/dL (ref 3.5–5.0)
Anion gap: 7 (ref 5–15)
BUN: 21 mg/dL — AB (ref 6–20)
CALCIUM: 10.7 mg/dL — AB (ref 8.9–10.3)
CO2: 27 mmol/L (ref 22–32)
CREATININE: 1.33 mg/dL — AB (ref 0.61–1.24)
Chloride: 106 mmol/L (ref 101–111)
GFR calc non Af Amer: 53 mL/min — ABNORMAL LOW (ref >60)
Glucose, Bld: 130 mg/dL — ABNORMAL HIGH (ref 65–99)
Potassium: 4.8 mmol/L (ref 3.5–5.1)
SODIUM: 140 mmol/L (ref 135–145)
Total Bilirubin: 0.6 mg/dL (ref 0.3–1.2)
Total Protein: 6.5 g/dL (ref 6.5–8.1)

## 2017-03-12 LAB — PSA: PSA: 0.03 ng/mL (ref 0.00–4.00)

## 2017-03-12 NOTE — Progress Notes (Signed)
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 There is probable supraglottic extension along the anterior false cords and suspected laryngeal cartilage erosion anteriorly at the level of of the commissure. No extension beyond the larynx, extension into the subglottic airway, or lymphadenopathy is identified.      08/06/2016 Imaging    CT chest- 1. Asymmetric interstitial changes involving the right lobe suggestive of interstitial lung disease. 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 A followup high-resolution CT of the chest is recommended in 6 months to assess for temporal change and provide a more detailed assessment of interstitial changes. 2. Enlarged mediastinal and right hilar lymph nodes. Nodes measure up to 1.5 cm which is considered pathologically enlarged by size criteria. In the setting of interstitial lung disease this is a nonspecific finding and may be reactive. 3. Aortic atherosclerosis, cardiac enlargement and multi vessel coronary artery calcification 4. Small pulmonary nodules are  identified in the right lung. The largest measures 6 mm. Attention at follow-up imaging advised.      08/15/2016 - 09/26/2016 Radiation Therapy    [name], [place]      12/27/2016 Imaging    CT chest high resolution- 1. Stable scattered pulmonary nodules, largest 6 mm, for which 4 month stability has been demonstrated. Recommend continued attention on follow-up chest CT in 6 months. 2. Interval stability of interstitial lung disease asymmetrically involving the right lung characterized by patchy subpleural reticulation and ground-glass attenuation with associated mild traction bronchiolectasis, no frank honeycombing and no clear basilar gradient. Findings are not appreciably changed at the lung bases compared to a 2015 CT abdomen study. 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 Follow-up high-resolution chest CT in 12 months recommended to ensure continued temporal pattern stability. 3. Stable mild mediastinal lymphadenopathy, most compatible with benign reactive adenopathy. 4. Aortic atherosclerosis. Left main and 3 vessel coronary atherosclerosis.      03/10/2017 PET scan    1. Accentuated activity at the glottic level posteriorly in the midline, maximum SUV 13.4, without well-defined CT correlate. Lesser activity along the arytenoid cartilage region, left greater than right. The patient's prior cancer was centered somewhat anteriorly along the anterior commissure, in this region does not appear differentially hypermetabolic on today's exam compared to the rest of the glottic region. Accordingly, the activity present today may simply be physiologic but forms a reasonable baseline for future comparisons. No adenopathy in the neck. 2. Mildly enlarged right paratracheal lymph node is mildly hypermetabolic with a maximum SUV of 5.4 (compared to background mediastinal blood pool activity level of 2.9). 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 Surveillance is likely warranted. There is some activity slightly above background in the right hilum. 3. No hypermetabolic lung nodules are currently visible, including the 7 mm average size right lower lobe nodule adjacent to the major fissure. This is right at the borderline of sensitive PET-CT size thresholds. 4. 3.7 cm infrarenal abdominal aortic aneurysm. Recommend followup by [place] in 2 years. This recommendation follows ACR consensus guidelines: White Paper of the ACR Incidental Findings Committee II on Vascular Findings. J Am Coll Radiol 2013; 10:789-794. 5. Vaguely accentuated activity in the left side of the prostate gland. No pelvic adenopathy is observed. No hypermetabolic bony lesions. 6.  Aortic Atherosclerosis (ICD10-I70.0).  Coronary atherosclerosis. 7. Left nephrectomy.       Prostate cancer ([place])   08/11/2016 Initial Diagnosis    Prostate cancer ([place])     09/23/2016 - 11/22/2016 Radiation Therapy    [name], [place]       CURRENT THERAPY: surveillance   INTERVAL HISTORY: Ivan [name] 71 y.o. male returns for continued follow-up and review of his PET scan results with his wife. He states he has been doing well. He has not had any problems swallowing or eating. He's gained weight. He continues to abstain from smoking and alcohol use. He denies any chest pain, shortness of breath, abdominal pain, focal weakness. He continues to take MiraLAX daily for constipation has not had any issues with his bowel movements. He denies any worsening fatigue or changes in his level of activity.   Patient Active Problem List   Diagnosis Date Noted  . Pulmonary nodules/lesions, multiple 03/12/2017  . Tobacco abuse 08/11/2016  . 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 Prostate cancer ([place]) 08/11/2016  . 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 Acute renal failure ([place]) 09/15/2014  . UTI (lower urinary tract infection) 09/15/2014  . Dehydration 09/15/2014  . Elevated troponin 09/15/2014  . Thrombocytopenia ([place]) 09/15/2014  . Hyponatremia 09/15/2014  . Sepsis ([place]) 09/15/2014  . Acute encephalopathy 09/15/2014  . NSTEMI (non-ST elevated myocardial infarction) ([place])     has No Known Allergies.  MEDICAL HISTORY: Past Medical History:  Diagnosis Date  . Arthritis   . Chronic back pain   . COPD (chronic obstructive pulmonary disease) ([place])   . Hypercalcemia   . Kidney stones   . Myocardial infarction   . 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 Prostate cancer ([place]) 08/11/2016  . Sepsis ([place])   . 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 UTI (lower urinary tract infection)     SURGICAL HISTORY: Past Surgical History:  Procedure Laterality Date  . APPENDECTOMY    . BACK SURGERY     10/2013  . GOLD SEED IMPLANT N/A 08/21/2016   Procedure: GOLD SEED IMPLANT;  Surgeon: Cleon Gustin, MD;  Location: [facility];  Service: Urology;  Laterality: N/A;  . HERNIA REPAIR    . MICROLARYNGOSCOPY Bilateral 07/23/2016   Procedure: MICRO DIRECT LARYNGOSCOPY WITH BIOPSY;  Surgeon: Leta Baptist, MD;  Location: [facility];  Service: ENT;  Laterality: Bilateral;  . NEPHRECTOMY Left    04/2014  . PROSTATE BIOPSY N/A 06/05/2016   Procedure: PROSTATE BIOPSY;  Surgeon: Cleon Gustin, MD;  Location: [facility];  Service: Urology;  Laterality: N/A;  . TRANSRECTAL ULTRASOUND N/A 08/21/2016   Procedure: TRANSRECTAL ULTRASOUND;  Surgeon: Cleon Gustin, MD;  Location: [facility];  Service: Urology;  Laterality: N/A;  . vocal cord biopsy  06/2016    SOCIAL HISTORY: Social History   Social History  . Marital status: Married    Spouse name: N/A  . Number of children: N/A  . Years of education: N/A   Occupational History  . Not on file.   Social History Main Topics  . Smoking status:  Current Every Day Smoker    Packs/day: 1.00    Years: 50.00    Types: Cigarettes  . Smokeless tobacco: Never Used  . Alcohol use No  . Drug use: No  . Sexual activity: Yes   Other Topics Concern  . Not on file   Social History Narrative  . No narrative on file    FAMILY HISTORY: Family History  Problem Relation Age of Onset  . Cancer Mother   . Cancer Brother     Review of Systems  Constitutional: Negative for appetite change, chills, fatigue and fever.  HENT:   Negative for hearing loss, lump/mass, mouth sores, sore throat and tinnitus.   Eyes: Negative for eye problems and icterus.  Respiratory: Negative for chest tightness, cough, hemoptysis, shortness of breath and wheezing.   Cardiovascular: Negative for chest pain, leg swelling and palpitations.  Gastrointestinal: Negative for abdominal distention, abdominal pain, blood in stool, diarrhea, nausea and vomiting.  Endocrine: Negative.  Negative for hot flashes.  Genitourinary: Negative for difficulty urinating, frequency and hematuria.   Musculoskeletal: Negative for arthralgias and neck pain.  Skin: Negative for itching and rash.  Neurological: Negative for dizziness, headaches and speech difficulty.  Hematological: Negative for adenopathy. Does not bruise/bleed easily.  Psychiatric/Behavioral: Negative for confusion. The patient is not nervous/anxious.       PHYSICAL EXAMINATION  ECOG PERFORMANCE STATUS: 1 - Symptomatic but completely ambulatory  Vitals:   03/12/17 1350  BP: 129/73  Pulse: (!) 112  Resp: 16  Temp: 98.4 F (36.9 C)    Physical Exam  Constitutional: He is oriented to person, place, and time and well-developed, well-nourished, and in no distress. No distress.  HENT:  Head: Normocephalic and atraumatic.  Mouth/Throat: No oropharyngeal exudate.  Eyes: Conjunctivae are normal. Pupils are equal, round, and reactive to light. No scleral icterus.  Neck: Normal range of motion. Neck supple. No  JVD present.  Cardiovascular: Normal rate, regular rhythm and normal heart sounds.  Exam reveals no gallop and no friction rub.   No murmur heard. Pulmonary/Chest: Breath sounds normal. No respiratory distress. He has no wheezes. He has no rales.  Abdominal: Soft. Bowel sounds are normal. He exhibits no distension. There is no tenderness. There is no guarding.  Musculoskeletal: He exhibits no edema or tenderness.  Lymphadenopathy:       Right: No supraclavicular adenopathy present.       Left: No supraclavicular adenopathy present.  Neurological: He is alert and oriented to person, place, and time. No cranial nerve deficit.  Skin: Skin is warm and dry. No rash noted. No erythema. No pallor.  Psychiatric: Affect and judgment normal.    LABORATORY DATA:  CBC    Component Value Date/Time   WBC 4.7 03/12/2017 1313   RBC 4.78 03/12/2017 1313   HGB 15.3 03/12/2017 1313   HCT 44.7 03/12/2017 1313   PLT 92 (L) 03/12/2017 1313   MCV 93.5 03/12/2017 1313   MCH 32.0 03/12/2017 1313   MCHC 34.2 03/12/2017 1313   RDW 12.9 03/12/2017 1313   LYMPHSABS 0.4 (L) 03/12/2017 1313  MONOABS 0.5 03/12/2017 1313   EOSABS 0.1 03/12/2017 1313   BASOSABS 0.0 03/12/2017 1313    CMP     Component Value Date/Time   NA 140 03/12/2017 1313   K 4.8 03/12/2017 1313   CL 106 03/12/2017 1313   CO2 27 03/12/2017 1313   GLUCOSE 130 (H) 03/12/2017 1313   BUN 21 (H) 03/12/2017 1313   CREATININE 1.33 (H) 03/12/2017 1313   CALCIUM 10.7 (H) 03/12/2017 1313   CALCIUM 9.7 09/16/2014 0347   PROT 6.5 03/12/2017 1313   ALBUMIN 3.9 03/12/2017 1313   AST 26 03/12/2017 1313   ALT 23 03/12/2017 1313   ALKPHOS 102 03/12/2017 1313   BILITOT 0.6 03/12/2017 1313   GFRNONAA 53 (L) 03/12/2017 1313   GFRAA >60 03/12/2017 1313       PENDING LABS:   RADIOGRAPHIC STUDIES:  Nm Pet Image Restag (ps) Skull Base To Thigh  Result Date: 03/10/2017 CLINICAL DATA:  Subsequent treatment strategy for laryngeal cancer.  History of prostate cancer. Hypercalcemia. EXAM: NUCLEAR MEDICINE PET SKULL BASE TO THIGH TECHNIQUE: 7.4 mCi F-18 FDG was injected intravenously. Full-ring PET imaging was performed from the skull base to thigh after the radiotracer. CT data was obtained and used for attenuation correction and anatomic localization. FASTING BLOOD GLUCOSE:  Value: 93 mg/dl COMPARISON:  Multiple exams, including 06/14/2016 FINDINGS: NECK No hypermetabolic lymph nodes in the neck. Carotid atherosclerotic calcifications. Posteriorly in the low supraglottic region there is focally increased activity at the midline, maximum SUV 13.4. In the vicinity of the left cord and particularly in the vicinity of the left arytenoid, maximum SUV is 6.8. Similar location on the right side has a maximum SUV of about 5.4. There is some activity along the right scalene musculature but no adenopathy in the neck is observed. Dental activity along the left maxilla associated with dental disease. CHEST A right paratracheal lymph node has a fatty hilum and measures 1.1 cm in short axis including the hilum on image 65/4, with a maximum SUV of 5.4. Background mediastinal blood pool activity is 2.9. Faint accentuation of hilar nodal activity bilaterally without visible nodal enlargement. Asymmetric interstitial lung disease on the right, with very faint associated activity. A 0.5 by 0.9 cm right lower lobe nodule adjacent to the major fissure is not appreciably hypermetabolic. This could be a subpleural lymph node. No hypermetabolic individual lung nodules are identified. Coronary, aortic arch, and branch vessel atherosclerotic vascular disease. ABDOMEN/PELVIS No abnormal hypermetabolic activity within the liver, pancreas, adrenal glands, or spleen. No hypermetabolic lymph nodes in the abdomen or pelvis. Left nephrectomy. Infrarenal abdominal aortic aneurysm 3.7 cm in AP dimension on image 141/4. Vaguely accentuated activity is present in the left side of the  prostate gland, maximum SUV 4.9 in this vicinity. Fiducials noted along the prostate posteriorly. Small amounts of subcutaneous nodularity with accentuated activity along the anterior abdominal wall and left buttock region, probably a manifestation of injections. SKELETON No focal hypermetabolic activity to suggest skeletal metastasis. IMPRESSION: 1. Accentuated activity at the glottic level posteriorly in the midline, maximum SUV 13.4, without well-defined CT correlate. Lesser activity along the arytenoid cartilage region, left greater than right. The patient's prior cancer was centered somewhat anteriorly along the anterior commissure, in this region does not appear differentially hypermetabolic on today's exam compared to the rest of the glottic region. Accordingly, the activity present today may simply be physiologic but forms a reasonable baseline for future comparisons. No adenopathy in the neck. 2. Mildly enlarged right paratracheal  lymph node is mildly hypermetabolic with a maximum SUV of 5.4 (compared to background mediastinal blood pool activity level of 2.9). 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 Surveillance is likely warranted. There is some activity slightly above background in the right hilum. 3. No hypermetabolic lung nodules are currently visible, including the 7 mm average size right lower lobe nodule adjacent to the major fissure. This is right at the borderline of sensitive PET-CT size thresholds. 4. 3.7 cm infrarenal abdominal aortic aneurysm. Recommend followup by [place] in 2 years. This recommendation follows ACR consensus guidelines: White Paper of the ACR Incidental Findings Committee II on Vascular Findings. J Am Coll Radiol 2013; 10:789-794. 5. Vaguely accentuated activity in the left side of the prostate gland. No pelvic adenopathy is observed.  No hypermetabolic bony lesions. 6.  Aortic Atherosclerosis (ICD10-I70.0).  Coronary atherosclerosis. 7. Left nephrectomy. Electronically Signed   By: Van Clines M.D.   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 Plan -Clinically NED. I have reviewed his PET scan in detail with the patient and his wife. There is no evidence of hypermetabolic activity in the area of his previous cancer. He does have accentuated activity at the level of the glottic early in the midline, which does not have a CT correlate, we'll continue observation of this area with repeat scan in 3 months. He also has a mildly hypermetabolic right paratracheal lymph node which I will continue to monitor. Plan to repeat CT soft tissue neck with contrast in 3 months.  Prostate cancer [facility]) Prostate cancer, S/P definitive XRT finishing in Jan 2018. No hematuria and no new urinary symptoms Plan  -PSA is pending for today. Continue follow-up with urology.  -PSA ordered for the next visit.  Polycythemia, secondary Secondary polycythemia secondary to smoking has reports SINCE patient has quit smoking.   Plan: -Encouraged continued smoking abstinence.  - Hemoglobin WNL today.  Pulmonary Nodules - Repeat surveillance scan in 3 months with a CT chest with contrast. - No hypermetabolic activity was seen in the pulmonary nodules on recent PET scan.  Infrarenal abdominal aortic aneurysm- 3.7 cm -Patient made aware that he has an abdominal aortic aneurysm as seen on recent PET scan. He will need a repeat abdominal ultrasound in 2 years per guidelines for continued surveillance.  Return to clinic in 3 months for continued follow-up and review of surveillance CT scans. Lab work on next visit as well.  Orders Placed This Encounter  Procedures  . CT Chest W  Contrast    Standing Status:   Future    Standing Expiration Date:   03/12/2018    Order Specific Question:   If indicated for the ordered procedure, I authorize the administration of contrast media per Radiology protocol    Answer:   Yes    Order Specific Question:   Reason for Exam (SYMPTOM  OR DIAGNOSIS REQUIRED)    Answer:   h/o left vocal cord squamous cell ca, and ILD, surveillance scan for pulm nodules    Order Specific Question:   Preferred imaging location?    Answer:   [facility]    Order Specific Question:   Radiology Contrast Protocol - do NOT remove  file path    Answer:   \\[email or web address].[email or web address]  . CT SOFT TISSUE NECK W CONTRAST    Standing Status:   Future    Standing Expiration Date:   06/12/2018    Order Specific Question:   If indicated for the ordered procedure, I authorize the administration of contrast media per Radiology protocol    Answer:   Yes    Order Specific Question:   Reason for Exam (SYMPTOM  OR DIAGNOSIS REQUIRED)    Answer:   h/o of left vocal cord SCC, continue to monitor for disease and hypermetabolic left paratracheal LN seen on last PET    Order Specific Question:   Preferred imaging location?    Answer:   [facility]    Order Specific Question:   Radiology Contrast Protocol - do NOT remove file path    Answer:   \\[email or web address].[email or web address]  . CBC with Differential    Standing Status:   Future    Standing Expiration Date:   03/12/2018  . Comprehensive metabolic panel    Standing Status:   Future    Standing Expiration Date:   03/12/2018  . PSA    Standing Status:   Future    Standing Expiration Date:   03/12/2018    All questions were answered. The patient knows to call the clinic with any problems, questions or concerns. We can certainly see the patient much sooner if necessary. This note was electronically signed. Twana First, MD 03/12/2017

## 2017-03-12 NOTE — Patient Instructions (Signed)
[place] at [facility] Discharge Instructions  RECOMMENDATIONS MADE BY THE CONSULTANT AND ANY TEST RESULTS WILL BE SENT TO YOUR REFERRING PHYSICIAN.  You were seen today by Dr. Twana First Follow up in 3 months We will schedule labs and CT scan a few days before appointment   Thank you for choosing [place] at [facility] to provide your oncology and hematology care.  To afford each patient quality time with our provider, please arrive at least 15 minutes before your scheduled appointment time.    If you have a lab appointment with the [facility] please come in thru the  Main Entrance and check in at the main information desk  You need to re-schedule your appointment should you arrive 10 or more minutes late.  We strive to give you quality time with our providers, and arriving late affects you and other patients whose appointments are after yours.  Also, if you no show three or more times for appointments you may be dismissed from the clinic at the providers discretion.     Again, thank you for choosing [facility].  Our hope is that these requests will decrease the amount of time that you wait before being seen by our physicians.       _____________________________________________________________  Should you have questions after your visit to [facility], please contact our office at [phone] between the hours of 8:30 a.m. and 4:30 p.m.  Voicemails left after 4:30 p.m. will not be returned until the following business day.  For prescription refill requests, have your pharmacy contact our office.       Resources For Cancer Patients and their Caregivers ? American Cancer Society: Can assist with transportation, wigs, general needs, runs Look Good Feel Better.        424-820-1825 ? Cancer Care: Provides financial assistance, online support groups, medication/co-pay assistance.  1-800-813-HOPE  825-325-5067) ? [employer] Assists [place] cancer patients and their families through emotional , educational and financial support.  (940)587-2503 ? [place] DSS Where to apply for food stamps, Medicaid and utility assistance. 305-494-4355 ? [employer]: Transportation to medical appointments. 919-243-0650 ? Social Security Administration: May apply for disability if have a Stage IV cancer. 818 880 2588 918 685 2843 ? [employer], Disability and Transit Services: Assists with nutrition, care and transit needs. [place] Support Programs: @10RELATIVEDAYS @ > Cancer Support Group  2nd Tuesday of the month 1pm-2pm, Journey Room  > Creative Journey  3rd Tuesday of the month 1130am-1pm, Journey Room  > Look Good Feel Better  1st Wednesday of the month 10am-12 noon, Journey Room (Call [place] to register [phone])

## 2017-03-13 LAB — KAPPA/LAMBDA LIGHT CHAINS
KAPPA FREE LGHT CHN: 28.5 mg/L — AB (ref 3.3–19.4)
Kappa, lambda light chain ratio: 0.98 (ref 0.26–1.65)
Lambda free light chains: 29 mg/L — ABNORMAL HIGH (ref 5.7–26.3)

## 2017-03-17 LAB — MULTIPLE MYELOMA PANEL, SERUM
ALPHA 1: 0.2 g/dL (ref 0.0–0.4)
ALPHA2 GLOB SERPL ELPH-MCNC: 0.8 g/dL (ref 0.4–1.0)
Albumin SerPl Elph-Mcnc: 3.7 g/dL (ref 2.9–4.4)
Albumin/Glob SerPl: 1.5 (ref 0.7–1.7)
B-GLOBULIN SERPL ELPH-MCNC: 1 g/dL (ref 0.7–1.3)
Gamma Glob SerPl Elph-Mcnc: 0.6 g/dL (ref 0.4–1.8)
Globulin, Total: 2.6 g/dL (ref 2.2–3.9)
IGG (IMMUNOGLOBIN G), SERUM: 569 mg/dL — AB (ref 700–1600)
IGM, SERUM: 88 mg/dL (ref 20–172)
IgA: 168 mg/dL (ref 61–437)
TOTAL PROTEIN ELP: 6.3 g/dL (ref 6.0–8.5)

## 2017-03-26 ENCOUNTER — Ambulatory Visit (INDEPENDENT_AMBULATORY_CARE_PROVIDER_SITE_OTHER): Payer: Medicare Other | Admitting: Urology

## 2017-03-26 DIAGNOSIS — C61 Malignant neoplasm of prostate: Secondary | ICD-10-CM | POA: Diagnosis not present

## 2017-04-25 DIAGNOSIS — C61 Malignant neoplasm of prostate: Secondary | ICD-10-CM | POA: Diagnosis not present

## 2017-04-25 DIAGNOSIS — N189 Chronic kidney disease, unspecified: Secondary | ICD-10-CM | POA: Diagnosis not present

## 2017-04-25 DIAGNOSIS — J449 Chronic obstructive pulmonary disease, unspecified: Secondary | ICD-10-CM | POA: Diagnosis not present

## 2017-04-25 DIAGNOSIS — M545 Low back pain: Secondary | ICD-10-CM | POA: Diagnosis not present

## 2017-05-08 IMAGING — CT CT ABD-PEL WO/W CM
2 of 8 series · 11 of 46 positions shown, 16 images · IV contrast (iopamidol)
Comparison: Multiple exams, including 04/07/2014

CLINICAL DATA: Prostate cancer diagnosis

EXAM:
CT ABDOMEN AND PELVIS WITHOUT AND WITH CONTRAST
TECHNIQUE: Multidetector CT imaging of the abdomen and pelvis was performed
following the standard protocol before and following the bolus
administration of intravenous contrast.
CONTRAST:  100mL [id number] IOPAMIDOL ([id number]) INJECTION 61%

[Series 2: routine abd pel without · axial · non-contrast · 0.61mm/px · z∈[-267,+128]mm · 8 of 103 slices shown, 13 images]
[im 12/103  soft-tissue]
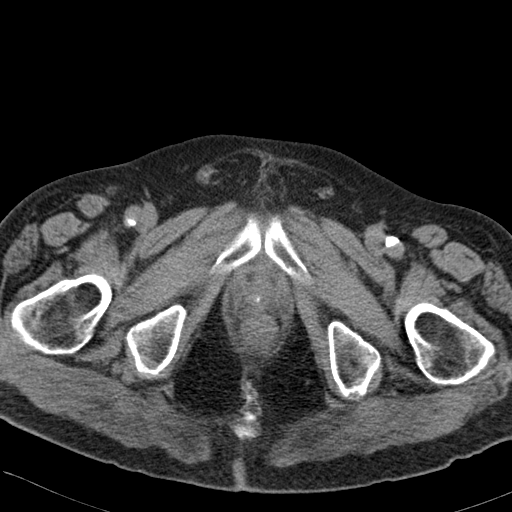
[im 12/103  bone]
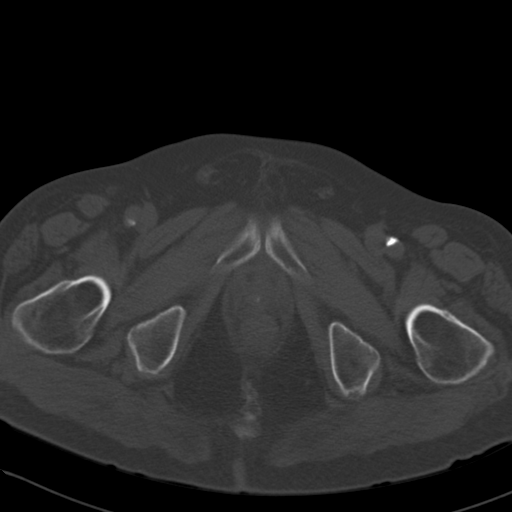
[im 23/103  soft-tissue]
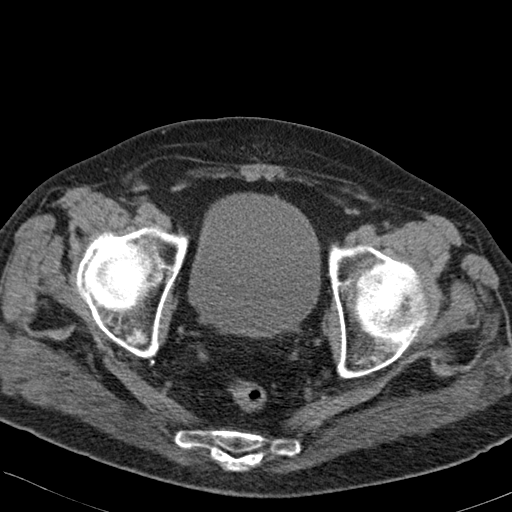
[im 35/103  soft-tissue]
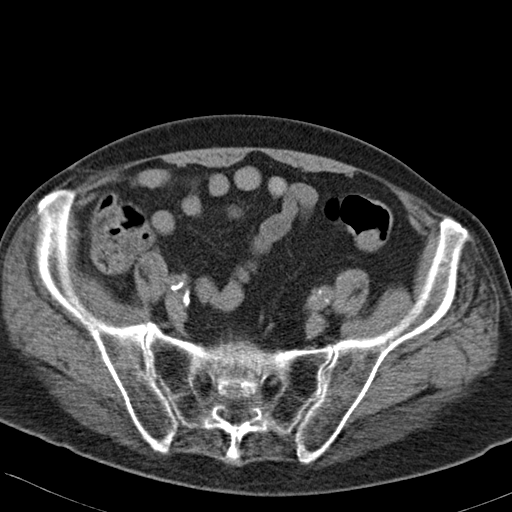
[im 46/103  soft-tissue]
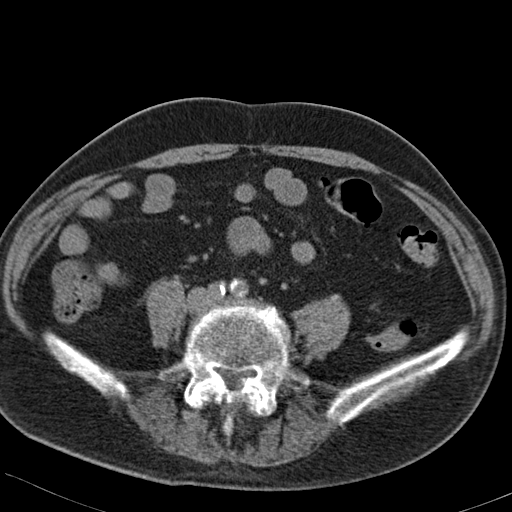
[im 57/103  soft-tissue]
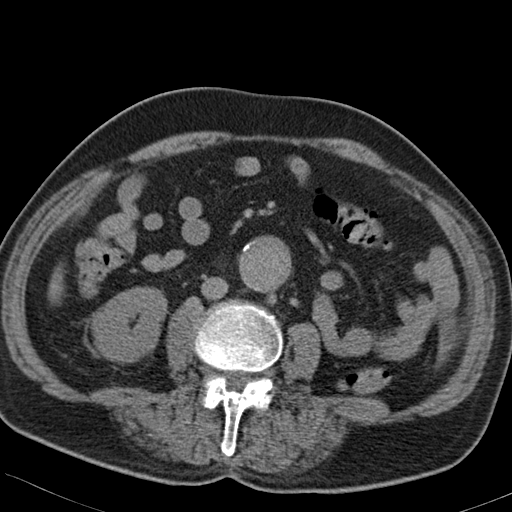
[im 57/103  lung]
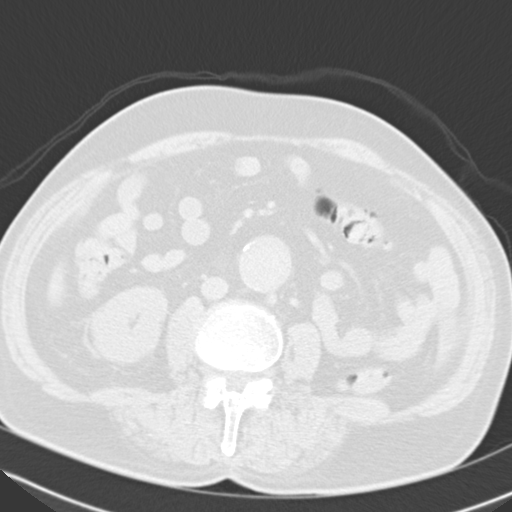
[im 69/103  soft-tissue]
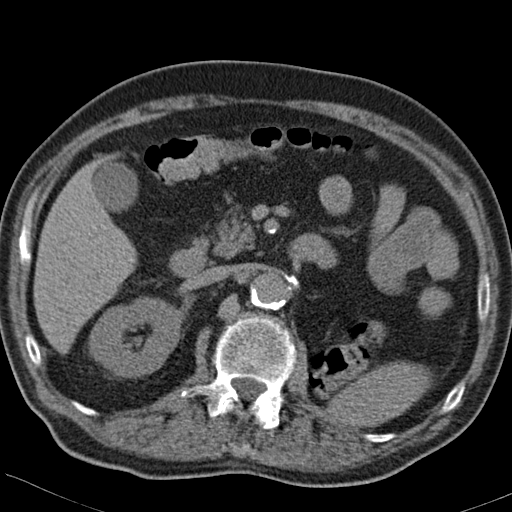
[im 69/103  lung]
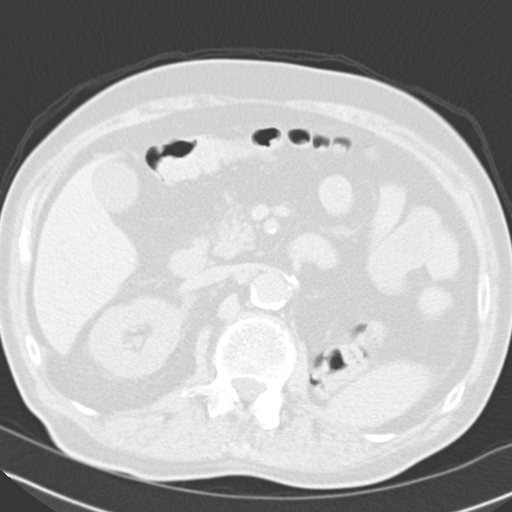
[im 80/103  soft-tissue]
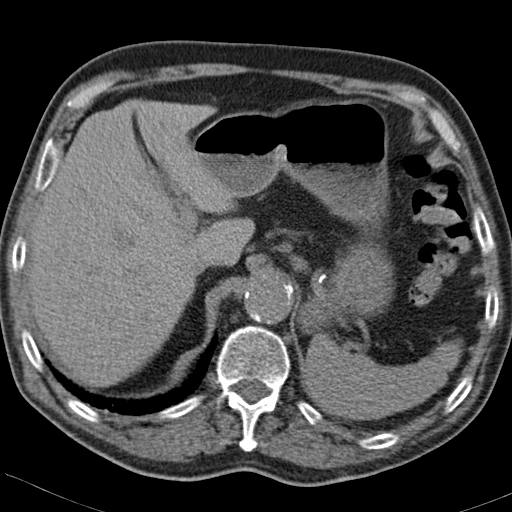
[im 80/103  lung]
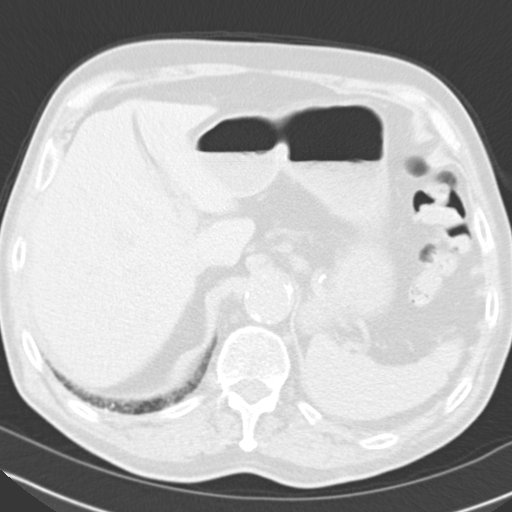
[im 91/103  soft-tissue]
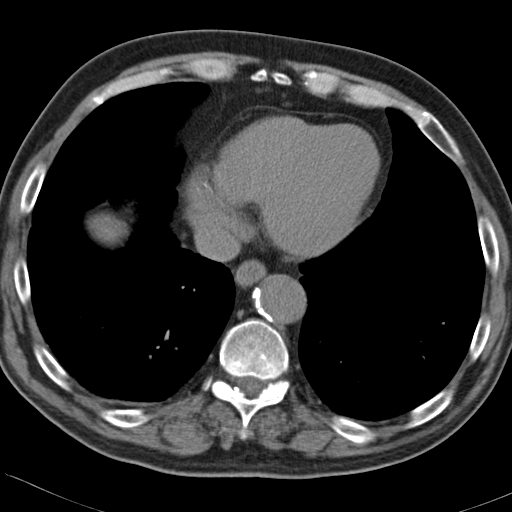
[im 91/103  lung]
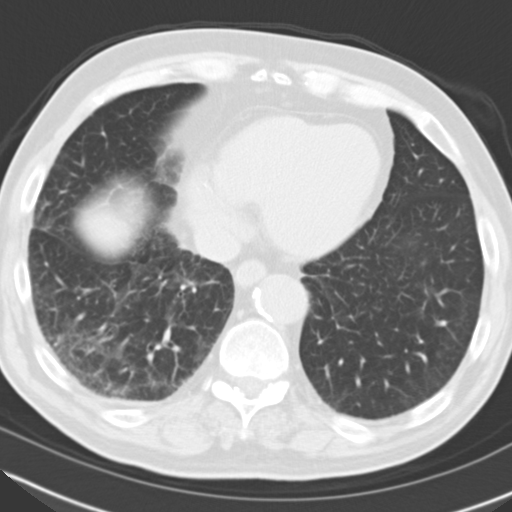

[Series 3: coronal · coronal · 0.67mm/px · 3 of 149 slices shown]
[im 38/149  soft-tissue]
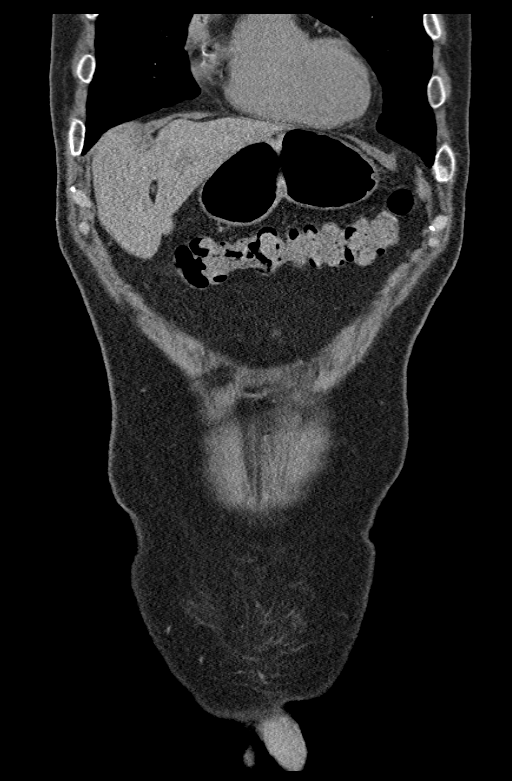
[im 75/149  soft-tissue]
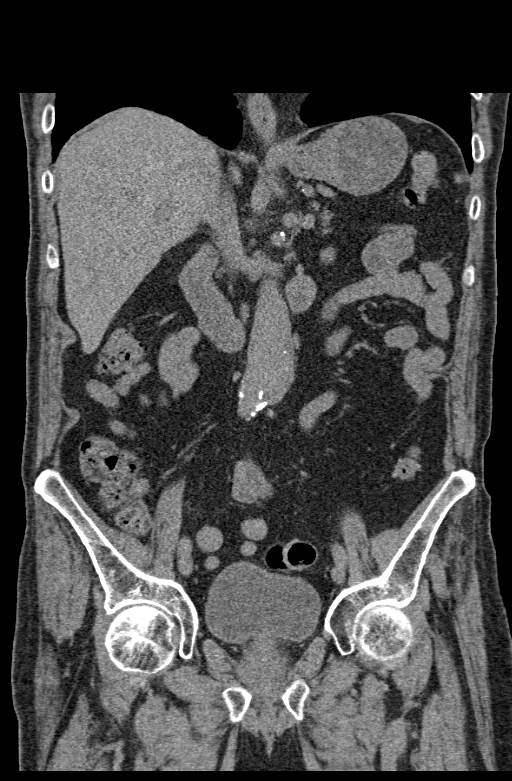
[im 112/149  soft-tissue]
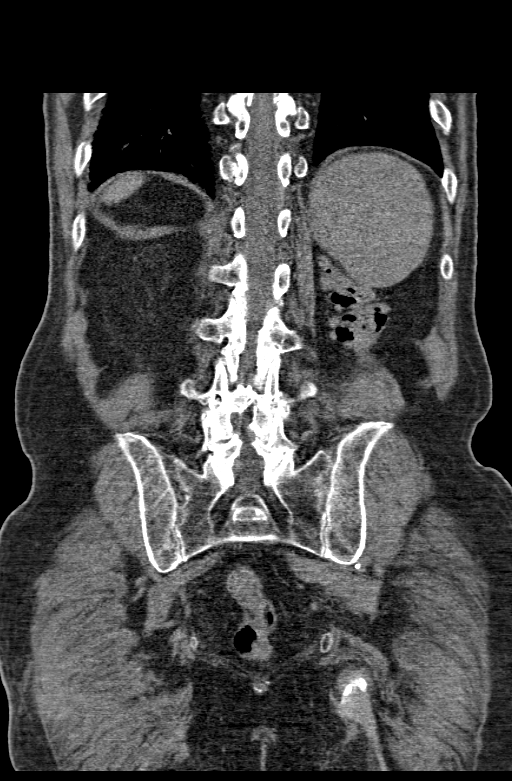

[11 of 46 positions shown; findings below may reference images not displayed]

FINDINGS: Lower chest: Chronic reticular interstitial accentuation in the
visualized portion of the right middle lobe and right lower lobe. 6
by 4 mm right middle lobe nodule, image [DATE], no change from
04/07/2014.

Coronary artery atherosclerosis. Atherosclerotic proximal ascending
aorta.

Hepatobiliary: Unremarkable

Pancreas: Unremarkable

Spleen: Unremarkable.

Adrenals/Urinary Tract: Several small hypodense lesions of the right
kidney are technically too small to characterize although
statistically likely to be benign. Left nephrectomy. Urinary bladder
unremarkable.

Stomach/Bowel: Unremarkable

Vascular/Lymphatic: Aortoiliac atherosclerotic vascular disease.
by 3.3 cm infrarenal fusiform abdominal aortic aneurysm with mural
thrombus, image 50/6, overall stable size compared to 04/07/14.

Small periaortic lymph nodes are not changed from 2120 and
considered benign. No pathologic pelvic adenopathy.

Reproductive: Prostate gland approximately 4.3 by 4.2 by 4.4 cm
(volume = 42 cm^3), with heterogeneous density along the posterior
peripheral zone and calcification at the junction of the central and
peripheral zones.

Other: No supplemental non-categorized findings.

Musculoskeletal: No CT findings of osseous metastatic disease.
Postoperative findings in the lower lumbar spine eccentric to the
left. Lumbar spondylosis and degenerative disc disease.
IMPRESSION: 1. No findings of adenopathy or osseous metastatic disease.
Heterogeneous density in the prostate gland with some
calcifications.
2. **An incidental finding of potential clinical significance has
been found. Stable infrarenal abdominal aortic aneurysm, 3.6 by
cm. Recommend followup by ultrasound in 2 years. This recommendation
follows ACR consensus guidelines: White Paper of the ACR Incidental
Findings Committee II on Vascular Findings. [HOSPITAL] 2193;
[DATE].**
3. A 5 mm right middle lobe pulmonary nodule is unchanged from 2120
and considered benign.
4. Chronic reticular interstitial accentuation at the right lung
base, no change from 2120.
5. Coronary artery atherosclerosis.
6. Left nephrectomy.
7. Lumbar spondylosis and degenerative disc disease.

## 2017-05-08 IMAGING — NM NM BONE WHOLE BODY
2 series · 2 of 2 positions shown · non-contrast
Comparison: MRI 02/07/2016.  CT 06/07/2014.

CLINICAL DATA: Prostate cancer.  Left oophorectomy.

EXAM:
NUCLEAR MEDICINE WHOLE BODY BONE SCAN
TECHNIQUE: Whole body anterior and posterior images were obtained approximately
3 hours after intravenous injection of radiopharmaceutical.
RADIOPHARMACEUTICALS:  20.1 mCi Jechnetium-BBm MDP IV

[Series 1: whole body · 2.66mm/px · 1 of 1 slices shown (1 of 2)]
[im 1/1]
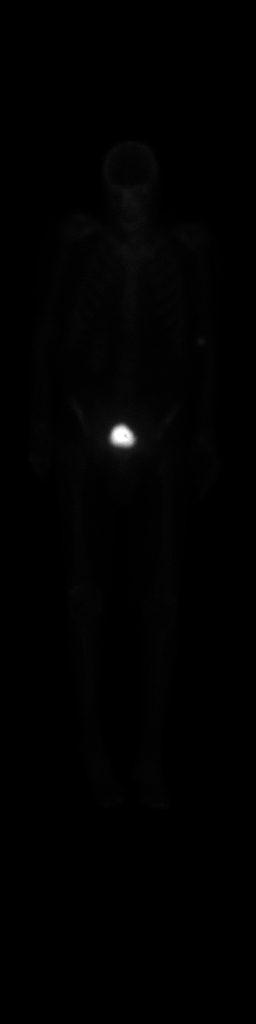

[Series 1: whole body · 2.66mm/px · 1 of 1 slices shown (2 of 2)]
[im 1/1]
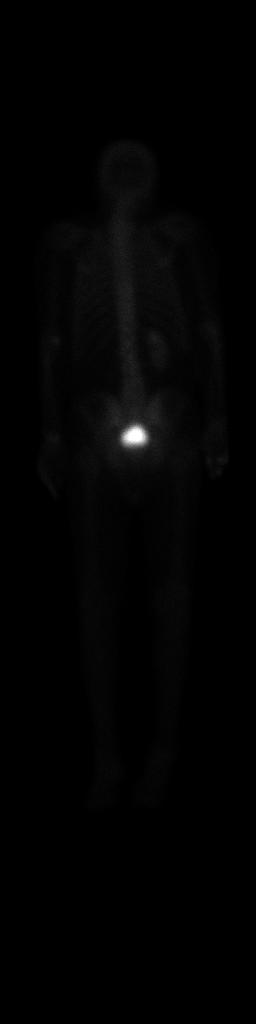

[2 of 2 positions shown; findings below may reference images not displayed]

FINDINGS: Left nephrectomy. Normal visualization the right kidney and bladder.
No focal bony abnormalities noted to suggest metastatic disease .
IMPRESSION: Negative exam.  No evidence of metastatic disease.

## 2017-05-29 ENCOUNTER — Ambulatory Visit (INDEPENDENT_AMBULATORY_CARE_PROVIDER_SITE_OTHER): Payer: Medicare Other | Admitting: Otolaryngology

## 2017-05-29 DIAGNOSIS — Z8521 Personal history of malignant neoplasm of larynx: Secondary | ICD-10-CM

## 2017-06-12 ENCOUNTER — Ambulatory Visit (HOSPITAL_COMMUNITY)
Admission: RE | Admit: 2017-06-12 | Discharge: 2017-06-12 | Disposition: A | Discharge status: Home / Self Care | Payer: Medicare Other | Source: Ambulatory Visit | Attending: Oncology | Admitting: Oncology

## 2017-06-12 DIAGNOSIS — C44329 Squamous cell carcinoma of skin of other parts of face: Secondary | ICD-10-CM | POA: Diagnosis not present

## 2017-06-12 DIAGNOSIS — R918 Other nonspecific abnormal finding of lung field: Secondary | ICD-10-CM | POA: Insufficient documentation

## 2017-06-12 DIAGNOSIS — I2510 Atherosclerotic heart disease of native coronary artery without angina pectoris: Secondary | ICD-10-CM | POA: Diagnosis not present

## 2017-06-12 DIAGNOSIS — I700 Atherosclerosis of aorta: Secondary | ICD-10-CM | POA: Diagnosis not present

## 2017-06-12 DIAGNOSIS — C329 Malignant neoplasm of larynx, unspecified: Secondary | ICD-10-CM | POA: Diagnosis not present

## 2017-06-12 DIAGNOSIS — C320 Malignant neoplasm of glottis: Secondary | ICD-10-CM

## 2017-06-12 DIAGNOSIS — C61 Malignant neoplasm of prostate: Secondary | ICD-10-CM | POA: Diagnosis not present

## 2017-06-12 DIAGNOSIS — J849 Interstitial pulmonary disease, unspecified: Secondary | ICD-10-CM | POA: Diagnosis not present

## 2017-06-12 LAB — POCT I-STAT CREATININE: Creatinine, Ser: 1.4 mg/dL — ABNORMAL HIGH (ref 0.61–1.24)

## 2017-06-12 MED ORDER — IOPAMIDOL (ISOVUE-300) INJECTION 61%
125.0000 mL | Freq: Once | INTRAVENOUS | Status: AC | PRN
  Administered 2017-06-12: 125 mL via INTRAVENOUS

## 2017-06-17 ENCOUNTER — Encounter (HOSPITAL_COMMUNITY): Payer: Self-pay | Admitting: Oncology

## 2017-06-17 ENCOUNTER — Encounter (HOSPITAL_BASED_OUTPATIENT_CLINIC_OR_DEPARTMENT_OTHER): Payer: Medicare Other | Admitting: Oncology

## 2017-06-17 ENCOUNTER — Encounter (HOSPITAL_COMMUNITY): Payer: Medicare Other | Attending: Oncology

## 2017-06-17 VITALS — BP 123/71 | HR 100 | Resp 18 | Ht 70.0 in | Wt 165.0 lb

## 2017-06-17 DIAGNOSIS — Z87442 Personal history of urinary calculi: Secondary | ICD-10-CM | POA: Diagnosis not present

## 2017-06-17 DIAGNOSIS — M549 Dorsalgia, unspecified: Secondary | ICD-10-CM | POA: Diagnosis not present

## 2017-06-17 DIAGNOSIS — D751 Secondary polycythemia: Secondary | ICD-10-CM | POA: Diagnosis not present

## 2017-06-17 DIAGNOSIS — G8929 Other chronic pain: Secondary | ICD-10-CM | POA: Diagnosis not present

## 2017-06-17 DIAGNOSIS — Z720 Tobacco use: Secondary | ICD-10-CM

## 2017-06-17 DIAGNOSIS — Z87440 Personal history of urinary (tract) infections: Secondary | ICD-10-CM | POA: Diagnosis not present

## 2017-06-17 DIAGNOSIS — I714 Abdominal aortic aneurysm, without rupture: Secondary | ICD-10-CM | POA: Insufficient documentation

## 2017-06-17 DIAGNOSIS — Z905 Acquired absence of kidney: Secondary | ICD-10-CM | POA: Diagnosis not present

## 2017-06-17 DIAGNOSIS — C61 Malignant neoplasm of prostate: Secondary | ICD-10-CM | POA: Diagnosis not present

## 2017-06-17 DIAGNOSIS — J479 Bronchiectasis, uncomplicated: Secondary | ICD-10-CM | POA: Insufficient documentation

## 2017-06-17 DIAGNOSIS — C320 Malignant neoplasm of glottis: Secondary | ICD-10-CM | POA: Insufficient documentation

## 2017-06-17 DIAGNOSIS — I252 Old myocardial infarction: Secondary | ICD-10-CM | POA: Insufficient documentation

## 2017-06-17 DIAGNOSIS — J449 Chronic obstructive pulmonary disease, unspecified: Secondary | ICD-10-CM | POA: Diagnosis not present

## 2017-06-17 DIAGNOSIS — R590 Localized enlarged lymph nodes: Secondary | ICD-10-CM | POA: Diagnosis not present

## 2017-06-17 DIAGNOSIS — I700 Atherosclerosis of aorta: Secondary | ICD-10-CM | POA: Diagnosis not present

## 2017-06-17 DIAGNOSIS — R918 Other nonspecific abnormal finding of lung field: Secondary | ICD-10-CM

## 2017-06-17 DIAGNOSIS — I2510 Atherosclerotic heart disease of native coronary artery without angina pectoris: Secondary | ICD-10-CM | POA: Diagnosis not present

## 2017-06-17 DIAGNOSIS — F17210 Nicotine dependence, cigarettes, uncomplicated: Secondary | ICD-10-CM | POA: Insufficient documentation

## 2017-06-17 DIAGNOSIS — R911 Solitary pulmonary nodule: Secondary | ICD-10-CM | POA: Diagnosis not present

## 2017-06-17 LAB — COMPREHENSIVE METABOLIC PANEL
ALT: 21 U/L (ref 17–63)
AST: 25 U/L (ref 15–41)
Albumin: 4 g/dL (ref 3.5–5.0)
Alkaline Phosphatase: 118 U/L (ref 38–126)
Anion gap: 8 (ref 5–15)
BILIRUBIN TOTAL: 0.7 mg/dL (ref 0.3–1.2)
BUN: 22 mg/dL — AB (ref 6–20)
CALCIUM: 10.9 mg/dL — AB (ref 8.9–10.3)
CO2: 25 mmol/L (ref 22–32)
CREATININE: 1.24 mg/dL (ref 0.61–1.24)
Chloride: 105 mmol/L (ref 101–111)
GFR calc Af Amer: >60 mL/min (ref >60)
GFR, EST NON AFRICAN AMERICAN: 58 mL/min — AB (ref >60)
Glucose, Bld: 125 mg/dL — ABNORMAL HIGH (ref 65–99)
Potassium: 4.6 mmol/L (ref 3.5–5.1)
Sodium: 138 mmol/L (ref 135–145)
TOTAL PROTEIN: 7.3 g/dL (ref 6.5–8.1)

## 2017-06-17 LAB — CBC WITH DIFFERENTIAL/PLATELET
Basophils Absolute: 0 10*3/uL (ref 0.0–0.1)
Basophils Relative: 0 %
EOS PCT: 2 %
Eosinophils Absolute: 0.1 10*3/uL (ref 0.0–0.7)
HCT: 49 % (ref 39.0–52.0)
Hemoglobin: 17.1 g/dL — ABNORMAL HIGH (ref 13.0–17.0)
LYMPHS ABS: 0.5 10*3/uL — AB (ref 0.7–4.0)
LYMPHS PCT: 10 %
MCH: 32.6 pg (ref 26.0–34.0)
MCHC: 34.9 g/dL (ref 30.0–36.0)
MCV: 93.3 fL (ref 78.0–100.0)
MONO ABS: 0.3 10*3/uL (ref 0.1–1.0)
MONOS PCT: 5 %
Neutro Abs: 4.2 10*3/uL (ref 1.7–7.7)
Neutrophils Relative %: 82 %
PLATELETS: 97 10*3/uL — AB (ref 150–400)
RBC: 5.25 MIL/uL (ref 4.22–5.81)
RDW: 12.9 % (ref 11.5–15.5)
WBC: 5.1 10*3/uL (ref 4.0–10.5)

## 2017-06-17 NOTE — Progress Notes (Signed)
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 There is probable supraglottic extension along the anterior false cords and suspected laryngeal cartilage erosion anteriorly at the level of of the commissure. No extension beyond the larynx, extension into the subglottic airway, or lymphadenopathy is identified.      08/06/2016 Imaging    CT chest- 1. Asymmetric interstitial changes involving the right lobe suggestive of interstitial lung disease. 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 A followup high-resolution CT of the chest is recommended in 6 months to assess for temporal change and provide a more detailed assessment of interstitial changes. 2. Enlarged mediastinal and right hilar lymph nodes. Nodes measure up to 1.5 cm which is considered pathologically enlarged by size criteria. In the setting of interstitial lung disease this is a nonspecific finding and may be reactive. 3. Aortic atherosclerosis, cardiac enlargement and multi vessel coronary artery calcification 4. Small pulmonary nodules are  identified in the right lung. The largest measures 6 mm. Attention at follow-up imaging advised.      08/15/2016 - 09/26/2016 Radiation Therapy    [name], [place]      12/27/2016 Imaging    CT chest high resolution- 1. Stable scattered pulmonary nodules, largest 6 mm, for which 4 month stability has been demonstrated. Recommend continued attention on follow-up chest CT in 6 months. 2. Interval stability of interstitial lung disease asymmetrically involving the right lung characterized by patchy subpleural reticulation and ground-glass attenuation with associated mild traction bronchiolectasis, no frank honeycombing and no clear basilar gradient. Findings are not appreciably changed at the lung bases compared to a 2015 CT abdomen study. 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 Follow-up high-resolution chest CT in 12 months recommended to ensure continued temporal pattern stability. 3. Stable mild mediastinal lymphadenopathy, most compatible with benign reactive adenopathy. 4. Aortic atherosclerosis. Left main and 3 vessel coronary atherosclerosis.      03/10/2017 PET scan    1. Accentuated activity at the glottic level posteriorly in the midline, maximum SUV 13.4, without well-defined CT correlate. Lesser activity along the arytenoid cartilage region, left greater than right. The patient's prior cancer was centered somewhat anteriorly along the anterior commissure, in this region does not appear differentially hypermetabolic on today's exam compared to the rest of the glottic region. Accordingly, the activity present today may simply be physiologic but forms a reasonable baseline for future comparisons. No adenopathy in the neck. 2. Mildly enlarged right paratracheal lymph node is mildly hypermetabolic with a maximum SUV of 5.4 (compared to background mediastinal blood pool activity level of 2.9). 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 Surveillance is likely warranted. There is some activity slightly above background in the right hilum. 3. No hypermetabolic lung nodules are currently visible, including the 7 mm average size right lower lobe nodule adjacent to the major fissure. This is right at the borderline of sensitive PET-CT size thresholds. 4. 3.7 cm infrarenal abdominal aortic aneurysm. Recommend followup by [place] in 2 years. This recommendation follows ACR consensus guidelines: White Paper of the ACR Incidental Findings Committee II on Vascular Findings. J Am Coll Radiol 2013; 10:789-794. 5. Vaguely accentuated activity in the left side of the prostate gland. No pelvic adenopathy is observed. No hypermetabolic bony lesions. 6.  Aortic Atherosclerosis (ICD10-I70.0).  Coronary atherosclerosis. 7. Left nephrectomy.      06/12/2017 Imaging    CT Chest with contrast: IMPRESSION: 1. No definitive change in pulmonary nodules with the largest measuring 6 mm, taking difference in slice selection into account. Recommend continued attention on follow-up in 6 months. 2. Interstitial lung disease, unchanged in the interval. 3. A few mildly enlarged mediastinal nodes as above are nonspecific but may be reactive. 4. Aortic atherosclerosis, cardiac enlargement, and multi vessel coronary artery disease.  Aortic Atherosclerosis (ICD10-I70.0).   Electronically Signed   By: Dorise Bullion III M.D   On: 06/12/2017 15:27      06/12/2017 Imaging    CT soft tissue neck: IMPRESSION: 1. Decreased enhancement and mass effect anterior commissure region of larynx. No new exophytic lesion or locally invasive mass to suggest recurrent/residual disease. No cervical lymphadenopathy by imaging criteria. 2. Bilateral carotid bifurcation atherosclerotic disease, right greater than left.  Given radiation treatment to the neck, consider follow-up with carotid Doppler to ensure continued patency.   Electronically Signed   By: Kristine Garbe M.D.   On: 06/12/2017 17:01       Prostate cancer ([place])   08/11/2016 Initial Diagnosis    Prostate cancer ([place])     09/23/2016 - 11/22/2016 Radiation Therapy    [name], [place]       CURRENT THERAPY: surveillance   INTERVAL HISTORY: FINCH COSTANZO 71 y.o. male returns for continued follow-up. He states that he has been doing well since his last visit. He continues to eat well and gaining weight. He denies any chest pain, shortness breath, abdominal pain, focal weakness, nausea, vomiting, diarrhea. Since his last visit he has taken up smoking again and currently smokes 1 pack per day.   Patient Active Problem List   Diagnosis Date Noted  . Pulmonary nodules/lesions, multiple 03/12/2017  . Tobacco abuse 08/11/2016  . 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 Prostate cancer ([place]) 08/11/2016  . 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 Acute renal failure ([place]) 09/15/2014  . UTI (lower urinary tract infection) 09/15/2014  . Dehydration 09/15/2014  . Elevated troponin 09/15/2014  . Thrombocytopenia ([place]) 09/15/2014  . Hyponatremia 09/15/2014  . Sepsis ([place]) 09/15/2014  . Acute encephalopathy 09/15/2014  . NSTEMI (non-ST elevated myocardial infarction) ([place])     has No Known Allergies.  MEDICAL HISTORY: Past Medical History:  Diagnosis Date  . Arthritis   . Chronic back pain   . COPD (chronic obstructive pulmonary disease) ([place])   . Hypercalcemia   . Kidney stones   . Myocardial infarction ([place])   . 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 Prostate cancer ([place]) 08/11/2016  . Sepsis ([place])   . Squamous cell carcinoma of  left vocal cord ([place]) 07/25/2016  . UTI (lower urinary tract infection)     SURGICAL HISTORY: Past Surgical History:  Procedure Laterality Date  . APPENDECTOMY    . BACK  SURGERY     10/2013  . GOLD SEED IMPLANT N/A 08/21/2016   Procedure: GOLD SEED IMPLANT;  Surgeon: Cleon Gustin, MD;  Location: [facility];  Service: Urology;  Laterality: N/A;  . HERNIA REPAIR    . MICROLARYNGOSCOPY Bilateral 07/23/2016   Procedure: MICRO DIRECT LARYNGOSCOPY WITH BIOPSY;  Surgeon: Leta Baptist, MD;  Location: [facility];  Service: ENT;  Laterality: Bilateral;  . NEPHRECTOMY Left    04/2014  . PROSTATE BIOPSY N/A 06/05/2016   Procedure: PROSTATE BIOPSY;  Surgeon: Cleon Gustin, MD;  Location: [facility];  Service: Urology;  Laterality: N/A;  . TRANSRECTAL ULTRASOUND N/A 08/21/2016   Procedure: TRANSRECTAL ULTRASOUND;  Surgeon: Cleon Gustin, MD;  Location: [facility];  Service: Urology;  Laterality: N/A;  . vocal cord biopsy  06/2016    SOCIAL HISTORY: Social History   Social History  . Marital status: Married    Spouse name: N/A  . Number of children: N/A  . Years of education: N/A   Occupational History  . Not on file.   Social History Main Topics  . Smoking status: Current Every Day Smoker    Packs/day: 1.00    Years: 50.00    Types: Cigarettes  . Smokeless tobacco: Never Used  . Alcohol use No  . Drug use: No  . Sexual activity: Yes   Other Topics Concern  . Not on file   Social History Narrative  . No narrative on file    FAMILY HISTORY: Family History  Problem Relation Age of Onset  . Cancer Mother   . Cancer Brother     Review of Systems  Constitutional: Negative for appetite change, chills, fatigue and fever.  HENT:   Negative for hearing loss, lump/mass, mouth sores, sore throat and tinnitus.   Eyes: Negative for eye problems and icterus.  Respiratory: Negative for chest tightness, cough, hemoptysis, shortness of breath and wheezing.   Cardiovascular: Negative for chest pain, leg swelling and palpitations.  Gastrointestinal: Negative for abdominal distention, abdominal pain, blood in stool, diarrhea, nausea and vomiting.   Endocrine: Negative.  Negative for hot flashes.  Genitourinary: Negative for difficulty urinating, frequency and hematuria.   Musculoskeletal: Positive for back pain. Negative for arthralgias and neck pain.  Skin: Negative for itching and rash.  Neurological: Negative for dizziness, headaches and speech difficulty.  Hematological: Negative for adenopathy. Does not bruise/bleed easily.  Psychiatric/Behavioral: Negative for confusion. The patient is not nervous/anxious.       PHYSICAL EXAMINATION  ECOG PERFORMANCE STATUS: 1 - Symptomatic but completely ambulatory  Vitals:   06/17/17 1407  BP: 123/71  Pulse: 100  Resp: 18  SpO2: 100%    Physical Exam  Constitutional: He is oriented to person, place, and time and well-developed, well-nourished, and in no distress. No distress.  HENT:  Head: Normocephalic and atraumatic.  Mouth/Throat: No oropharyngeal exudate.  Eyes: Pupils are equal, round, and reactive to light. Conjunctivae are normal. No scleral icterus.  Neck: Normal range of motion. Neck supple. No JVD present.  Cardiovascular: Normal rate, regular rhythm and normal heart sounds.  Exam reveals no gallop and no friction rub.   No murmur heard. Pulmonary/Chest: Breath sounds normal. No respiratory distress. He has no wheezes. He has no rales.  Abdominal: Soft. Bowel sounds are  normal. He exhibits no distension. There is no tenderness. There is no guarding.  Musculoskeletal: He exhibits no edema or tenderness.  Lymphadenopathy:       Right: No supraclavicular adenopathy present.       Left: No supraclavicular adenopathy present.  Neurological: He is alert and oriented to person, place, and time. No cranial nerve deficit.  Skin: Skin is warm and dry. No rash noted. No erythema. No pallor.  Psychiatric: Affect and judgment normal.    LABORATORY DATA:  CBC    Component Value Date/Time   WBC 5.1 06/17/2017 1321   RBC 5.25 06/17/2017 1321   HGB 17.1 (H) 06/17/2017 1321    HCT 49.0 06/17/2017 1321   PLT 97 (L) 06/17/2017 1321   MCV 93.3 06/17/2017 1321   MCH 32.6 06/17/2017 1321   MCHC 34.9 06/17/2017 1321   RDW 12.9 06/17/2017 1321   LYMPHSABS 0.5 (L) 06/17/2017 1321   MONOABS 0.3 06/17/2017 1321   EOSABS 0.1 06/17/2017 1321   BASOSABS 0.0 06/17/2017 1321    CMP     Component Value Date/Time   NA 138 06/17/2017 1321   K 4.6 06/17/2017 1321   CL 105 06/17/2017 1321   CO2 25 06/17/2017 1321   GLUCOSE 125 (H) 06/17/2017 1321   BUN 22 (H) 06/17/2017 1321   CREATININE 1.24 06/17/2017 1321   CALCIUM 10.9 (H) 06/17/2017 1321   CALCIUM 9.7 09/16/2014 0347   PROT 7.3 06/17/2017 1321   ALBUMIN 4.0 06/17/2017 1321   AST 25 06/17/2017 1321   ALT 21 06/17/2017 1321   ALKPHOS 118 06/17/2017 1321   BILITOT 0.7 06/17/2017 1321   GFRNONAA 58 (L) 06/17/2017 1321   GFRAA >60 06/17/2017 1321       PENDING LABS:   RADIOGRAPHIC STUDIES:  No results found.     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 Plan -Clinically NED. I have reviewed his CT soft tissue neck with contrast and CT chest in detail with his wife. No evidence of recurrent laryngeal disease. Pulm nodules are stable. Plan to repeat his CT soft tissue neck and CT chest in 6 months.  Prostate cancer [facility]) Prostate cancer, S/P definitive XRT finishing in Jan 2018. No hematuria and no new urinary symptoms Plan  -PSA low at 0.03 in may 2018. Continue follow-up with urology.    Polycythemia, secondary Secondary polycythemia secondary to smoking has reports SINCE patient has quit smoking.   Plan: - Hemoglobin elevated today likely due to smoking.  Pulmonary Nodules - Pulmonary nodules stable on repeat CT. Repeat surveillance scan in 6 months with a CT chest with contrast. - No hypermetabolic activity was seen in the pulmonary nodules on recent  PET scan.  Infrarenal abdominal aortic aneurysm- 3.7 cm -Patient made aware that he has an abdominal aortic aneurysm as seen on recent PET scan. He will need a repeat abdominal ultrasound in 2 years per guidelines for continued surveillance.  Smoking -Strongly counseled patient again on efforts for smoke cessation, he verbalized understanding.  Return to clinic in 3 months for continued follow-up. Lab work on next visit as well.    All questions were answered. The patient knows to call the clinic with any problems, questions or concerns. We can certainly see the patient much sooner if necessary.  This note was electronically signed.  Twana First, MD 06/17/2017

## 2017-06-18 LAB — PSA: Prostatic Specific Antigen: 0.02 ng/mL (ref 0.00–4.00)

## 2017-06-25 ENCOUNTER — Ambulatory Visit (INDEPENDENT_AMBULATORY_CARE_PROVIDER_SITE_OTHER): Payer: Medicare Other | Admitting: Urology

## 2017-06-25 DIAGNOSIS — C61 Malignant neoplasm of prostate: Secondary | ICD-10-CM

## 2017-08-29 ENCOUNTER — Emergency Department (HOSPITAL_COMMUNITY): Payer: Medicare Other

## 2017-08-29 ENCOUNTER — Encounter (HOSPITAL_COMMUNITY): Payer: Self-pay | Admitting: Emergency Medicine

## 2017-08-29 ENCOUNTER — Emergency Department (HOSPITAL_COMMUNITY)
Admission: EM | Admit: 2017-08-29 | Discharge: 2017-08-29 | Disposition: A | Discharge status: Home / Self Care | Payer: Medicare Other | Attending: Emergency Medicine | Admitting: Emergency Medicine

## 2017-08-29 DIAGNOSIS — Z8521 Personal history of malignant neoplasm of larynx: Secondary | ICD-10-CM | POA: Insufficient documentation

## 2017-08-29 DIAGNOSIS — M545 Low back pain, unspecified: Secondary | ICD-10-CM

## 2017-08-29 DIAGNOSIS — Z79899 Other long term (current) drug therapy: Secondary | ICD-10-CM | POA: Diagnosis not present

## 2017-08-29 DIAGNOSIS — Z8546 Personal history of malignant neoplasm of prostate: Secondary | ICD-10-CM | POA: Diagnosis not present

## 2017-08-29 DIAGNOSIS — I252 Old myocardial infarction: Secondary | ICD-10-CM | POA: Diagnosis not present

## 2017-08-29 DIAGNOSIS — M549 Dorsalgia, unspecified: Secondary | ICD-10-CM | POA: Diagnosis not present

## 2017-08-29 DIAGNOSIS — J449 Chronic obstructive pulmonary disease, unspecified: Secondary | ICD-10-CM | POA: Diagnosis not present

## 2017-08-29 DIAGNOSIS — Z87891 Personal history of nicotine dependence: Secondary | ICD-10-CM | POA: Diagnosis not present

## 2017-08-29 LAB — URINALYSIS, ROUTINE W REFLEX MICROSCOPIC
BACTERIA UA: NONE SEEN
Bilirubin Urine: NEGATIVE
GLUCOSE, UA: NEGATIVE mg/dL
KETONES UR: NEGATIVE mg/dL
Nitrite: NEGATIVE
PROTEIN: NEGATIVE mg/dL
Specific Gravity, Urine: 1.017 (ref 1.005–1.030)
Squamous Epithelial / LPF: NONE SEEN
pH: 5 (ref 5.0–8.0)

## 2017-08-29 LAB — CBC
HEMATOCRIT: 46.2 % (ref 39.0–52.0)
HEMOGLOBIN: 15.9 g/dL (ref 13.0–17.0)
MCH: 31.6 pg (ref 26.0–34.0)
MCHC: 34.4 g/dL (ref 30.0–36.0)
MCV: 91.8 fL (ref 78.0–100.0)
Platelets: 125 10*3/uL — ABNORMAL LOW (ref 150–400)
RBC: 5.03 MIL/uL (ref 4.22–5.81)
RDW: 13.2 % (ref 11.5–15.5)
WBC: 7 10*3/uL (ref 4.0–10.5)

## 2017-08-29 LAB — BASIC METABOLIC PANEL
ANION GAP: 9 (ref 5–15)
BUN: 21 mg/dL — ABNORMAL HIGH (ref 6–20)
CHLORIDE: 105 mmol/L (ref 101–111)
CO2: 23 mmol/L (ref 22–32)
CREATININE: 1.09 mg/dL (ref 0.61–1.24)
Calcium: 10.9 mg/dL — ABNORMAL HIGH (ref 8.9–10.3)
Glucose, Bld: 114 mg/dL — ABNORMAL HIGH (ref 65–99)
Potassium: 4.6 mmol/L (ref 3.5–5.1)
SODIUM: 137 mmol/L (ref 135–145)

## 2017-08-29 MED ORDER — CYCLOBENZAPRINE HCL 10 MG PO TABS
10.0000 mg | ORAL_TABLET | Freq: Once | ORAL | Status: AC
  Administered 2017-08-29: 10 mg via ORAL
  Filled 2017-08-29: qty 1

## 2017-08-29 MED ORDER — LIDOCAINE 5 % EX PTCH: 1.0000 | MEDICATED_PATCH | CUTANEOUS | 0 refills | Status: DC

## 2017-08-29 MED ORDER — OXYCODONE-ACETAMINOPHEN 5-325 MG PO TABS
2.0000 | ORAL_TABLET | Freq: Once | ORAL | Status: AC
  Administered 2017-08-29: 2 via ORAL
  Filled 2017-08-29: qty 2

## 2017-08-29 MED ORDER — PREDNISONE 20 MG PO TABS: 20.0000 mg | ORAL_TABLET | Freq: Every day | ORAL | 0 refills | Status: AC

## 2017-08-29 NOTE — Discharge Instructions (Signed)
No fracture or bony abnormality found on the xrays of the spine and pelvis.  You have chronic arthritis in the lower back which was seen on the x-ray.  White blood cell count and red blood cell count was normal.  Your kidney function looked good.  Please keep appointment with Dr. Luan Pulling for potential need for MRI, PSA recheck and further workup of your back pain.  You also had a small amount of blood in the urine, this has been found in previous samples but please have this rechecked as well.  Your urine does not look infected today, however we are sending it for culture and this will take a few days to result.   I have prescribed you a lidocaine patch, please apply this to the back daily to help relieve your symptoms.  You may continue taking oxycodone for your lower back pain prescribed to you by Dr. Luan Pulling.  I also prescribed a steroid medicine called prednisone for you to take for the next 5 days.  Return to the ER if you have back pain with fever greater than 100.4 Fahrenheit, back pain with numbness in both of your legs, back pain in which he lose control of your bowels or bladder.  Please also return for any new or worsening symptoms.

## 2017-08-29 NOTE — ED Provider Notes (Signed)
[facility] EMERGENCY DEPARTMENT Provider Note   CSN: [id number] Arrival date & time: 08/29/17  0856     History   Chief Complaint Chief Complaint  Patient presents with  . Back Pain    HPI Ivan [name] is a 71 y.o. male.  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  Patient states that his pain is been worsening for the past 2 weeks.  It started when he leaned over to grab something in front of him and has been constant since that time.  States that his pain was so bad today that it was difficult for him to get out of the car without assistance.  At this time reports 8/10 severity constant "dull and aching" right lower back pain.  It becomes a 10/10 in severity and "sharp" when he stands up, twists or ambulates. It is relieved by lying still on his right side.  States that the pain was so bad today that he felt nauseated. He has taken oxycodone today for his symptoms, which he chronically takes for lower back pain.  Has had previous back surgery "for my sciatica in 2015."  Reports that he is concerned about his kidney function, as his urine has been dark in color for the past 2 weeks as well.  States that he has had worsening weakness for the past 2 years now.  Denies recent injury.  Denies fever, weight loss, night sweats, abdominal pain, vomiting, diarrhea, dysuria, urinary frequency, malodorous urine, loss of bladder or bowel control, saddle anesthesia, numbness. States that his brother died of renal cell carcinoma. He is able to ambulate independently although painful.   Past Medical History:  Diagnosis Date  . Arthritis   .  Chronic back pain   . COPD (chronic obstructive pulmonary disease) ([place])   . Hypercalcemia   . Kidney stones   . Myocardial infarction ([place])   . 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 Prostate cancer ([place]) 08/11/2016  . Sepsis ([place])   . 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 UTI (lower urinary tract infection)     Patient Active Problem List   Diagnosis Date Noted  . Pulmonary nodules/lesions, multiple 03/12/2017  . Tobacco abuse 08/11/2016  . 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 Prostate cancer ([place]) 08/11/2016  . Squamous cell carcinoma of left vocal cord ([place]) 07/25/2016  . Acute renal failure ([place]) 09/15/2014  . UTI (lower urinary tract infection) 09/15/2014  . Dehydration 09/15/2014  . Elevated troponin 09/15/2014  . Thrombocytopenia ([place]) 09/15/2014  . Hyponatremia 09/15/2014  . Sepsis ([place]) 09/15/2014  . Acute encephalopathy 09/15/2014  . NSTEMI (non-ST elevated myocardial infarction) [facility])     Past Surgical History:  Procedure Laterality Date  . APPENDECTOMY    . BACK SURGERY     10/2013  . GOLD SEED IMPLANT N/A 08/21/2016   Procedure: GOLD SEED IMPLANT;  Surgeon: Cleon Gustin, MD;  Location: [facility];  Service: Urology;  Laterality: N/A;  . HERNIA REPAIR    . MICROLARYNGOSCOPY Bilateral 07/23/2016   Procedure: MICRO DIRECT LARYNGOSCOPY WITH BIOPSY;  Surgeon: Leta Baptist, MD;  Location: [facility];  Service: ENT;  Laterality:  Bilateral;  . NEPHRECTOMY Left    04/2014  . PROSTATE BIOPSY N/A 06/05/2016   Procedure: PROSTATE BIOPSY;  Surgeon: Cleon Gustin, MD;  Location: [facility];  Service: Urology;  Laterality: N/A;  . TRANSRECTAL ULTRASOUND N/A 08/21/2016   Procedure: TRANSRECTAL ULTRASOUND;  Surgeon: Cleon Gustin, MD;  Location: [facility];  Service: Urology;  Laterality: N/A;  . vocal cord biopsy  06/2016       Home Medications    Prior to Admission medications   Medication Sig Start Date End Date Taking?  Authorizing Provider  acetaminophen (TYLENOL) 500 MG tablet Take 500 mg by mouth every 6 (six) hours as needed (for pain.).   Yes [provider]  ALPRAZolam Duanne Moron) 1 MG tablet Take 1 mg by mouth at bedtime.  08/15/14  Yes [provider]  atorvastatin (LIPITOR) 20 MG tablet Take 20 mg by mouth daily.   Yes [provider]  Melatonin 5 MG TABS Take 5 mg by mouth at bedtime as needed (for sleep).   Yes [provider]  Multiple Vitamin (MULTIVITAMIN WITH MINERALS) TABS tablet Take 1 tablet by mouth daily.   Yes [provider]  nitrofurantoin, macrocrystal-monohydrate, (MACROBID) 100 MG capsule Take 1 capsule (100 mg total) by mouth 2 (two) times daily. Patient taking differently: Take 100 mg by mouth daily.  09/20/14  Yes [name], MD  ondansetron (ZOFRAN) 4 MG tablet Take 4 mg by mouth every 8 (eight) hours as needed for nausea or vomiting.  10/07/16  Yes [provider]  Oxycodone HCl 10 MG TABS Take 10 mg by mouth every 6 (six) hours as needed (pain).  08/15/14  Yes [provider]  polyethylene glycol (MIRALAX / GLYCOLAX) packet Take 17 g by mouth daily.    Yes [provider]  Potassium Gluconate 595 MG CAPS Take 595 mg by mouth daily.   Yes [provider]  lidocaine (LIDODERM) 5 % Place 1 patch onto the skin daily. Remove & Discard patch within 12 hours or as directed by MD 08/29/17   Glyn Ade, PA-C  predniSONE (DELTASONE) 20 MG tablet Take 1 tablet (20 mg total) by mouth daily. 08/29/17 09/03/17  Glyn Ade, PA-C    Family History Family History  Problem Relation Age of Onset  . Cancer Mother   . Cancer Brother     Social History Social History  Substance Use Topics  . Smoking status: Former Smoker    Packs/day: 1.00    Years: 50.00    Types: Cigarettes    Quit date: 08/24/2017  . Smokeless tobacco: Never Used  . Alcohol use No     Allergies   Patient has no known  allergies.   Review of Systems Review of Systems  Constitutional: Negative for appetite change, chills, diaphoresis, fatigue, fever and unexpected weight change.  HENT: Negative for congestion.   Eyes: Negative for visual disturbance.  Respiratory: Negative for cough and shortness of breath.   Cardiovascular: Negative for chest pain.  Gastrointestinal: Positive for nausea. Negative for abdominal pain, diarrhea and vomiting.  Genitourinary: Negative for decreased urine volume, difficulty urinating, dysuria, flank pain, frequency, hematuria and urgency.  Musculoskeletal: Positive for back pain. Negative for neck pain and neck stiffness.  Skin: Negative for rash.  Neurological: Positive for weakness (generalized, for past two years.). Negative for numbness and headaches.  Psychiatric/Behavioral: Negative for agitation.     Physical Exam Updated Vital Signs BP 117/76 (BP Location: Right Arm)   Pulse  75   Temp 97.7 F (36.5 C) (Oral)   Resp 18   Ht 5\' 10"  (1.778 m)   Wt 74.8 kg (165 lb)   SpO2 95%   BMI 23.68 kg/m   Physical Exam  Constitutional: He is oriented to person, place, and time. He appears well-developed and well-nourished. No distress.  Lying on his right side, appears comfortable in the bed.   HENT:  Head: Normocephalic and atraumatic.  Mouth/Throat: Oropharynx is clear and moist. No oropharyngeal exudate.  Eyes: Pupils are equal, round, and reactive to light. Conjunctivae are normal. Right eye exhibits no discharge. Left eye exhibits no discharge.  Neck: Normal range of motion. Neck supple.  No midline cervical spine tenderness.   Cardiovascular: Normal rate, regular rhythm and intact distal pulses.  Exam reveals no friction rub.   No murmur heard. Pulmonary/Chest: Effort normal and breath sounds normal. No respiratory distress. He has no wheezes. He has no rales.  Abdominal: Soft. Bowel sounds are normal. There is no tenderness. There is no rebound and no  guarding.  No CVA tenderness. No pulsatile mass.   Musculoskeletal: Normal range of motion.  Midline surgical scar noted in the lumbar spine. No midline T-spine or L-spine tenderness.  Mild tenderness to palpation in the right lumbar paraspinal muscles.  Strength 5/5 in bilateral ankles and lower legs.  DP pulses 2+ bilaterally.  Lymphadenopathy:    He has no cervical adenopathy.  Neurological: He is alert and oriented to person, place, and time. Coordination normal.  Distal sensation to sharp/light touch intact in bilateral lower extremities.  Patellar reflex 2+ bilaterally.  Gait normal in balance and coordination although painful.  Skin: Skin is warm and dry. Capillary refill takes less than 2 seconds. He is not diaphoretic.  Psychiatric: He has a normal mood and affect. His behavior is normal.  Nursing note and vitals reviewed.    ED Treatments / Results  Labs (all labs ordered are listed, but only abnormal results are displayed) Labs Reviewed  URINALYSIS, ROUTINE W REFLEX MICROSCOPIC - Abnormal; Notable for the following:       Result Value   APPearance HAZY (*)    Hgb urine dipstick SMALL (*)    Leukocytes, UA SMALL (*)    All other components within normal limits  CBC - Abnormal; Notable for the following:    Platelets 125 (*)    All other components within normal limits  BASIC METABOLIC PANEL - Abnormal; Notable for the following:    Glucose, Bld 114 (*)    BUN 21 (*)    Calcium 10.9 (*)    All other components within normal limits  URINE CULTURE    EKG  EKG Interpretation None       Radiology Dg Lumbar Spine Complete  Result Date: 08/29/2017 CLINICAL DATA:  Back and pelvis pain. EXAM: LUMBAR SPINE - COMPLETE 4+ VIEW COMPARISON:  PET-CT 03/10/2017. FINDINGS: Lumbar spine numbered with the lowest segmented appearing lumbar shaped vertebra on lateral view as L5. Degenerative changes lumbar spine. 7 mm anterolisthesis L4 on L5. Diffuse osteopenia. No acute bony  abnormality. Aortoiliac atherosclerotic vascular calcification. IMPRESSION: 1. Diffuse osteopenia and degenerative change. 7 mm anterolisthesis L4 on L5. No evidence fracture. 2. Aortoiliac atherosclerotic vascular disease. Electronically Signed   By: Marcello Moores  Register   On: 08/29/2017 12:56   Dg Pelvis 1-2 Views  Result Date: 08/29/2017 CLINICAL DATA:  Back pain.  History of cancer EXAM: PELVIS - 1-2 VIEW COMPARISON:  PET-CT 03/10/2017 FINDINGS: Osteopenia.  No evidence of bone lesion or fracture. No erosive changes. Prostate fiducial markers. IMPRESSION: 1. No acute or aggressive finding. 2. Osteopenia and atherosclerosis. Electronically Signed   By: Monte Fantasia M.D.   On: 08/29/2017 12:56    Procedures Procedures (including critical care time)  Medications Ordered in ED Medications  oxyCODONE-acetaminophen (PERCOCET/ROXICET) 5-325 MG per tablet 2 tablet (2 tablets Oral Given 08/29/17 1233)  cyclobenzaprine (FLEXERIL) tablet 10 mg (10 mg Oral Given 08/29/17 1233)     Initial Impression / Assessment and Plan / ED Course  I have reviewed the triage vital signs and the nursing notes.  Pertinent labs & imaging results that were available during my care of the patient were reviewed by me and considered in my medical decision making (see chart for details).    Patient presents with right lower back pain for the past 2 weeks, worsened with twisting and bending. No neurological deficits and normal neuro exam.  Patient can walk but states is painful. No loss of bowel or bladder control. No concern for cauda equina. Discussed this patient with Dr. Laverta Baltimore who agrees that given significant past history of cancer, will get x-rays of the lumbar spine and pelvis.  We will also get basic labs and UA given history of UTIs and dark urine recently.  Xrays of lumbar spine and pelvis reveal diffuse osteopenia and degenerative changes, no acute fracture or lytic lesion.  Basic labs reviewed.  Kidney function is  at baseline, CBC unremarkable, UA shows small amount of RBCs. UA also shows small amount of leukocytes, no bacteria. Doubt infection, but will send urine for culture given history of recurrent UTI with sepsis.  Do not suspect renal stone, as patient denies flank pain, his symptoms of pain are related to movement and his pain is reproduced with palpation of the back.  He does have some blood in his urine, but this appears to be chronic for him over the past 2 years.  Patient is able to ambulate independently, although painful. Will send him home with symptomatic treatment of lidocaine patch to place over the lower back and a steroid burst.  He may continue to take the oxycodone prescribed by his primary doctor for chronic low back pain.  Have discussed importance of following up with his primary care doctor regarding his lower right back pain.  Have also counseled patient that he has a small amount of blood in his urine, that he should have this evaluated at his primary doctor's office as well.  Discussed return precautions and patient agrees and voices understanding.  Discussed this patient with Dr. Laverta Baltimore who also saw the patient and agrees with plan to discharge.   Final Clinical Impressions(s) / ED Diagnoses   Final diagnoses:  Acute right-sided low back pain without sciatica    New Prescriptions Discharge Medication List as of 08/29/2017  3:02 PM    START taking these medications   Details  lidocaine (LIDODERM) 5 % Place 1 patch onto the skin daily. Remove & Discard patch within 12 hours or as directed by MD, Starting Fri 08/29/2017, Print    predniSONE (DELTASONE) 20 MG tablet Take 1 tablet (20 mg total) by mouth daily., Starting Fri 08/29/2017, Until Wed 09/03/2017, Print         Luanna Cole, Mirian Mo, PA-C 08/29/17 1706    Long, Wonda [facility], MD 08/30/17 1505

## 2017-08-29 NOTE — ED Triage Notes (Signed)
Patient c/o right lower back pain x2 weeks. Denies any fevers. Patient reports nausea but no vomiting or diarrhea. Last BM yesterday-black per patient. Patient does report taking pepto-bismol. Per patient urinary frequency. Patient states this is new. Hx of prostate cancer-last radiation in January 2018.

## 2017-08-29 NOTE — ED Notes (Addendum)
Pt assisted to BR at this time. Pt was able to slide from w/c after removing arm to toilet independently

## 2017-08-29 NOTE — ED Notes (Signed)
Patient reports taking oxycodone 5mg /325mg  prior to triage.

## 2017-09-03 DIAGNOSIS — Z23 Encounter for immunization: Secondary | ICD-10-CM | POA: Diagnosis not present

## 2017-09-03 DIAGNOSIS — M545 Low back pain: Secondary | ICD-10-CM | POA: Diagnosis not present

## 2017-09-03 DIAGNOSIS — J449 Chronic obstructive pulmonary disease, unspecified: Secondary | ICD-10-CM | POA: Diagnosis not present

## 2017-09-03 DIAGNOSIS — N189 Chronic kidney disease, unspecified: Secondary | ICD-10-CM | POA: Diagnosis not present

## 2017-09-03 DIAGNOSIS — C61 Malignant neoplasm of prostate: Secondary | ICD-10-CM | POA: Diagnosis not present

## 2017-09-09 ENCOUNTER — Other Ambulatory Visit (HOSPITAL_COMMUNITY): Payer: Self-pay | Admitting: Pulmonary Disease

## 2017-09-09 DIAGNOSIS — M545 Low back pain: Secondary | ICD-10-CM

## 2017-09-12 ENCOUNTER — Other Ambulatory Visit (HOSPITAL_COMMUNITY): Payer: Self-pay | Admitting: *Deleted

## 2017-09-12 DIAGNOSIS — C320 Malignant neoplasm of glottis: Secondary | ICD-10-CM

## 2017-09-13 DIAGNOSIS — C61 Malignant neoplasm of prostate: Secondary | ICD-10-CM | POA: Diagnosis not present

## 2017-09-15 ENCOUNTER — Encounter (HOSPITAL_BASED_OUTPATIENT_CLINIC_OR_DEPARTMENT_OTHER): Payer: Medicare Other | Admitting: Oncology

## 2017-09-15 ENCOUNTER — Other Ambulatory Visit: Payer: Self-pay

## 2017-09-15 ENCOUNTER — Encounter (HOSPITAL_COMMUNITY): Payer: Medicare Other | Attending: Oncology

## 2017-09-15 ENCOUNTER — Encounter (HOSPITAL_COMMUNITY): Payer: Self-pay | Admitting: Oncology

## 2017-09-15 VITALS — BP 135/88 | HR 115 | Temp 97.9°F | Resp 16 | Wt 157.7 lb

## 2017-09-15 DIAGNOSIS — Z905 Acquired absence of kidney: Secondary | ICD-10-CM | POA: Diagnosis not present

## 2017-09-15 DIAGNOSIS — Z8521 Personal history of malignant neoplasm of larynx: Secondary | ICD-10-CM | POA: Diagnosis not present

## 2017-09-15 DIAGNOSIS — I714 Abdominal aortic aneurysm, without rupture: Secondary | ICD-10-CM | POA: Insufficient documentation

## 2017-09-15 DIAGNOSIS — C760 Malignant neoplasm of head, face and neck: Secondary | ICD-10-CM

## 2017-09-15 DIAGNOSIS — D751 Secondary polycythemia: Secondary | ICD-10-CM | POA: Diagnosis not present

## 2017-09-15 DIAGNOSIS — J449 Chronic obstructive pulmonary disease, unspecified: Secondary | ICD-10-CM | POA: Diagnosis not present

## 2017-09-15 DIAGNOSIS — R918 Other nonspecific abnormal finding of lung field: Secondary | ICD-10-CM | POA: Diagnosis not present

## 2017-09-15 DIAGNOSIS — D6959 Other secondary thrombocytopenia: Secondary | ICD-10-CM

## 2017-09-15 DIAGNOSIS — Z85818 Personal history of malignant neoplasm of other sites of lip, oral cavity, and pharynx: Secondary | ICD-10-CM

## 2017-09-15 DIAGNOSIS — Z87440 Personal history of urinary (tract) infections: Secondary | ICD-10-CM | POA: Insufficient documentation

## 2017-09-15 DIAGNOSIS — C61 Malignant neoplasm of prostate: Secondary | ICD-10-CM | POA: Diagnosis not present

## 2017-09-15 DIAGNOSIS — F17210 Nicotine dependence, cigarettes, uncomplicated: Secondary | ICD-10-CM | POA: Diagnosis not present

## 2017-09-15 DIAGNOSIS — C320 Malignant neoplasm of glottis: Secondary | ICD-10-CM

## 2017-09-15 DIAGNOSIS — Z923 Personal history of irradiation: Secondary | ICD-10-CM

## 2017-09-15 DIAGNOSIS — M549 Dorsalgia, unspecified: Secondary | ICD-10-CM | POA: Diagnosis not present

## 2017-09-15 DIAGNOSIS — Z08 Encounter for follow-up examination after completed treatment for malignant neoplasm: Secondary | ICD-10-CM | POA: Insufficient documentation

## 2017-09-15 DIAGNOSIS — F17218 Nicotine dependence, cigarettes, with other nicotine-induced disorders: Secondary | ICD-10-CM | POA: Diagnosis not present

## 2017-09-15 DIAGNOSIS — I252 Old myocardial infarction: Secondary | ICD-10-CM | POA: Insufficient documentation

## 2017-09-15 DIAGNOSIS — Z87442 Personal history of urinary calculi: Secondary | ICD-10-CM | POA: Diagnosis not present

## 2017-09-15 DIAGNOSIS — G8929 Other chronic pain: Secondary | ICD-10-CM | POA: Insufficient documentation

## 2017-09-15 DIAGNOSIS — Z8546 Personal history of malignant neoplasm of prostate: Secondary | ICD-10-CM | POA: Insufficient documentation

## 2017-09-15 LAB — COMPREHENSIVE METABOLIC PANEL
ALBUMIN: 4.1 g/dL (ref 3.5–5.0)
ALT: 51 U/L (ref 17–63)
AST: 32 U/L (ref 15–41)
Alkaline Phosphatase: 126 U/L (ref 38–126)
Anion gap: 11 (ref 5–15)
BUN: 25 mg/dL — AB (ref 6–20)
CO2: 22 mmol/L (ref 22–32)
Calcium: 10.9 mg/dL — ABNORMAL HIGH (ref 8.9–10.3)
Chloride: 104 mmol/L (ref 101–111)
Creatinine, Ser: 1.26 mg/dL — ABNORMAL HIGH (ref 0.61–1.24)
GFR calc Af Amer: >60 mL/min (ref >60)
GFR, EST NON AFRICAN AMERICAN: 57 mL/min — AB (ref >60)
Glucose, Bld: 156 mg/dL — ABNORMAL HIGH (ref 65–99)
POTASSIUM: 4.8 mmol/L (ref 3.5–5.1)
Sodium: 137 mmol/L (ref 135–145)
Total Bilirubin: 0.9 mg/dL (ref 0.3–1.2)
Total Protein: 6.9 g/dL (ref 6.5–8.1)

## 2017-09-15 LAB — CBC WITH DIFFERENTIAL/PLATELET
BASOS ABS: 0 10*3/uL (ref 0.0–0.1)
Basophils Relative: 0 %
EOS PCT: 0 %
Eosinophils Absolute: 0 10*3/uL (ref 0.0–0.7)
HCT: 50.7 % (ref 39.0–52.0)
Hemoglobin: 16.9 g/dL (ref 13.0–17.0)
Lymphocytes Relative: 3 %
Lymphs Abs: 0.3 10*3/uL — ABNORMAL LOW (ref 0.7–4.0)
MCH: 31.9 pg (ref 26.0–34.0)
MCHC: 33.3 g/dL (ref 30.0–36.0)
MCV: 95.8 fL (ref 78.0–100.0)
MONO ABS: 0.2 10*3/uL (ref 0.1–1.0)
Monocytes Relative: 2 %
Neutro Abs: 10.1 10*3/uL — ABNORMAL HIGH (ref 1.7–7.7)
Neutrophils Relative %: 95 %
PLATELETS: 108 10*3/uL — AB (ref 150–400)
RBC: 5.29 MIL/uL (ref 4.22–5.81)
RDW: 14.1 % (ref 11.5–15.5)
WBC: 10.6 10*3/uL — AB (ref 4.0–10.5)

## 2017-09-15 NOTE — Progress Notes (Signed)
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 There is probable supraglottic extension along the anterior false cords and suspected laryngeal cartilage erosion anteriorly at the level of of the commissure. No extension beyond the larynx, extension into the subglottic airway, or lymphadenopathy is identified.      08/06/2016 Imaging    CT chest- 1. Asymmetric interstitial changes involving the right lobe suggestive of interstitial lung disease. 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 A followup high-resolution CT of the chest is recommended in 6 months to assess for temporal change and provide a more detailed assessment of interstitial changes. 2. Enlarged mediastinal and right hilar lymph nodes. Nodes measure up to 1.5 cm which is considered pathologically enlarged by size criteria. In the setting of interstitial lung disease this is a nonspecific finding and may be reactive. 3. Aortic atherosclerosis, cardiac enlargement and multi vessel coronary artery calcification 4. Small pulmonary nodules are  identified in the right lung. The largest measures 6 mm. Attention at follow-up imaging advised.      08/15/2016 - 09/26/2016 Radiation Therapy    [name], [place]      12/27/2016 Imaging    CT chest high resolution- 1. Stable scattered pulmonary nodules, largest 6 mm, for which 4 month stability has been demonstrated. Recommend continued attention on follow-up chest CT in 6 months. 2. Interval stability of interstitial lung disease asymmetrically involving the right lung characterized by patchy subpleural reticulation and ground-glass attenuation with associated mild traction bronchiolectasis, no frank honeycombing and no clear basilar gradient. Findings are not appreciably changed at the lung bases compared to a 2015 CT abdomen study. 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 Follow-up high-resolution chest CT in 12 months recommended to ensure continued temporal pattern stability. 3. Stable mild mediastinal lymphadenopathy, most compatible with benign reactive adenopathy. 4. Aortic atherosclerosis. Left main and 3 vessel coronary atherosclerosis.      03/10/2017 PET scan    1. Accentuated activity at the glottic level posteriorly in the midline, maximum SUV 13.4, without well-defined CT correlate. Lesser activity along the arytenoid cartilage region, left greater than right. The patient's prior cancer was centered somewhat anteriorly along the anterior commissure, in this region does not appear differentially hypermetabolic on today's exam compared to the rest of the glottic region. Accordingly, the activity present today may simply be physiologic but forms a reasonable baseline for future comparisons. No adenopathy in the neck. 2. Mildly enlarged right paratracheal lymph node is mildly hypermetabolic with a maximum SUV of 5.4 (compared to background mediastinal blood pool activity level of 2.9). 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 Surveillance is likely warranted. There is some activity slightly above background in the right hilum. 3. No hypermetabolic lung nodules are currently visible, including the 7 mm average size right lower lobe nodule adjacent to the major fissure. This is right at the borderline of sensitive PET-CT size thresholds. 4. 3.7 cm infrarenal abdominal aortic aneurysm. Recommend followup by [place] in 2 years. This recommendation follows ACR consensus guidelines: White Paper of the ACR Incidental Findings Committee II on Vascular Findings. J Am Coll Radiol 2013; 10:789-794. 5. Vaguely accentuated activity in the left side of the prostate gland. No pelvic adenopathy is observed. No hypermetabolic bony lesions. 6.  Aortic Atherosclerosis (ICD10-I70.0).  Coronary atherosclerosis. 7. Left nephrectomy.      06/12/2017 Imaging    CT Chest with contrast: IMPRESSION: 1. No definitive change in pulmonary nodules with the largest measuring 6 mm, taking difference in slice selection into account. Recommend continued attention on follow-up in 6 months. 2. Interstitial lung disease, unchanged in the interval. 3. A few mildly enlarged mediastinal nodes as above are nonspecific but may be reactive. 4. Aortic atherosclerosis, cardiac enlargement, and multi vessel coronary artery disease.  Aortic Atherosclerosis (ICD10-I70.0).   Electronically Signed   By: Dorise Bullion III M.D   On: 06/12/2017 15:27      06/12/2017 Imaging    CT soft tissue neck: IMPRESSION: 1. Decreased enhancement and mass effect anterior commissure region of larynx. No new exophytic lesion or locally invasive mass to suggest recurrent/residual disease. No cervical lymphadenopathy by imaging criteria. 2. Bilateral carotid bifurcation atherosclerotic disease, right greater than left.  Given radiation treatment to the neck, consider follow-up with carotid Doppler to ensure continued patency.   Electronically Signed   By: Kristine Garbe M.D.   On: 06/12/2017 17:01       Prostate cancer ([place])   08/11/2016 Initial Diagnosis    Prostate cancer ([place])      09/23/2016 - 11/22/2016 Radiation Therapy    [name], [place]       CURRENT THERAPY: surveillance   INTERVAL HISTORY: JUSTINN WELTER 71 y.o. male returns for continued follow-up. He states that he has been doing well since his last visit. He continues to eat well and has not lost any weight. He states that he feels like he is deconditioned and cannot do his normal activities as he did prior to his diagnosis of cancer. He complains of chronic back pain as well which is limiting his mobility. He denies any chest pain, shortness breath, abdominal pain, focal weakness, nausea, vomiting, diarrhea. Since his last visit he has taken up smoking again and currently smokes 1 pack per day.   Patient Active Problem List   Diagnosis Date Noted  . Pulmonary nodules/lesions, multiple 03/12/2017  . Tobacco abuse 08/11/2016  . 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 Prostate cancer ([place]) 08/11/2016  . 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 Acute renal failure ([place]) 09/15/2014  . UTI (lower urinary tract infection) 09/15/2014  . Dehydration 09/15/2014  . Elevated troponin 09/15/2014  . Thrombocytopenia ([place]) 09/15/2014  . Hyponatremia 09/15/2014  . Sepsis ([place]) 09/15/2014  . Acute encephalopathy 09/15/2014  . NSTEMI (non-ST elevated myocardial infarction) ([place])     has No Known Allergies.  MEDICAL HISTORY: Past Medical History:  Diagnosis Date  . Arthritis   . Chronic back pain   . COPD (chronic obstructive  pulmonary disease) ([place])   . Hypercalcemia   . Kidney stones   . Myocardial infarction ([place])   . 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 Prostate cancer ([place]) 08/11/2016  . Sepsis  ([place])   . 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 UTI (lower urinary tract infection)     SURGICAL HISTORY: Past Surgical History:  Procedure Laterality Date  . APPENDECTOMY    . BACK SURGERY     10/2013  . GOLD SEED IMPLANT N/A 08/21/2016   Performed by Cleon Gustin, MD at [facility]  . HERNIA REPAIR    . MICRO DIRECT LARYNGOSCOPY WITH BIOPSY Bilateral 07/23/2016   Performed by Leta Baptist, MD at [facility]  . NEPHRECTOMY Left    04/2014  . PROSTATE BIOPSY N/A 06/05/2016   Performed by Cleon Gustin, MD at [facility]  . TRANSRECTAL ULTRASOUND N/A 08/21/2016   Performed by Cleon Gustin, MD at [facility]  . vocal cord biopsy  06/2016    SOCIAL HISTORY: Social History   Socioeconomic History  . Marital status: Married    Spouse name: Not on file  . Number of children: Not on file  . Years of education: Not on file  . Highest education level: Not on file  Social Needs  . Financial resource strain: Not on file  . Food insecurity - worry: Not on file  . Food insecurity - inability: Not on file  . Transportation needs - medical: Not on file  . Transportation needs - non-medical: Not on file  Occupational History  . Not on file  Tobacco Use  . Smoking status: Former Smoker    Packs/day: 1.00    Years: 50.00    Pack years: 50.00    Types: Cigarettes    Last attempt to quit: 08/24/2017    Years since quitting: 0.0  . Smokeless tobacco: Never Used  Substance and Sexual Activity  . Alcohol use: No  . Drug use: No  . Sexual activity: Yes  Other Topics Concern  . Not on file  Social History Narrative  . Not on file    FAMILY HISTORY: Family History  Problem Relation Age of Onset  . Cancer Mother   . Cancer Brother     Review of Systems  Constitutional: Negative for appetite change, chills, fatigue and fever.  HENT:   Negative for hearing loss, lump/mass, mouth sores, sore throat and tinnitus.   Eyes: Negative for eye  problems and icterus.  Respiratory: Negative for chest tightness, cough, hemoptysis, shortness of breath and wheezing.   Cardiovascular: Negative for chest pain, leg swelling and palpitations.  Gastrointestinal: Negative for abdominal distention, abdominal pain, blood in stool, diarrhea, nausea and vomiting.  Endocrine: Negative.  Negative for hot flashes.  Genitourinary: Negative for difficulty urinating, frequency and hematuria.   Musculoskeletal: Positive for back pain. Negative for arthralgias and neck pain.  Skin: Negative for itching and rash.  Neurological: Negative for dizziness, headaches and speech difficulty.  Hematological: Negative for adenopathy. Does not bruise/bleed easily.  Psychiatric/Behavioral: Negative for confusion. The patient is not nervous/anxious.       PHYSICAL EXAMINATION  ECOG PERFORMANCE STATUS: 1 - Symptomatic but completely ambulatory  Vitals:   09/15/17 1357  BP: 135/88  Pulse: (!) 115  Resp: 16  Temp: 97.9 F (36.6 C)  SpO2: 96%    Physical Exam  Constitutional: He is oriented to person, place, and time and well-developed, well-nourished, and in no distress.  No distress.  HENT:  Head: Normocephalic and atraumatic.  Mouth/Throat: No oropharyngeal exudate.  Eyes: Conjunctivae are normal. Pupils are equal, round, and reactive to light. No scleral icterus.  Neck: Normal range of motion. Neck supple. No JVD present.  Cardiovascular: Normal rate, regular rhythm and normal heart sounds. Exam reveals no gallop and no friction rub.  No murmur heard. Pulmonary/Chest: Breath sounds normal. No respiratory distress. He has no wheezes. He has no rales.  Abdominal: Soft. Bowel sounds are normal. He exhibits no distension. There is no tenderness. There is no guarding.  Musculoskeletal: He exhibits no edema or tenderness.  Lymphadenopathy:       Right: No supraclavicular adenopathy present.       Left: No supraclavicular adenopathy present.  Neurological:  He is alert and oriented to person, place, and time. No cranial nerve deficit.  Skin: Skin is warm and dry. No rash noted. No erythema. No pallor.  Psychiatric: Affect and judgment normal.    LABORATORY DATA:  CBC    Component Value Date/Time   WBC 10.6 (H) 09/15/2017 1333   RBC 5.29 09/15/2017 1333   HGB 16.9 09/15/2017 1333   HCT 50.7 09/15/2017 1333   PLT PENDING 09/15/2017 1333   MCV 95.8 09/15/2017 1333   MCH 31.9 09/15/2017 1333   MCHC 33.3 09/15/2017 1333   RDW 14.1 09/15/2017 1333   LYMPHSABS 0.3 (L) 09/15/2017 1333   MONOABS 0.2 09/15/2017 1333   EOSABS 0.0 09/15/2017 1333   BASOSABS 0.0 09/15/2017 1333    CMP     Component Value Date/Time   NA 137 08/29/2017 1329   K 4.6 08/29/2017 1329   CL 105 08/29/2017 1329   CO2 23 08/29/2017 1329   GLUCOSE 114 (H) 08/29/2017 1329   BUN 21 (H) 08/29/2017 1329   CREATININE 1.09 08/29/2017 1329   CALCIUM 10.9 (H) 08/29/2017 1329   CALCIUM 9.7 09/16/2014 0347   PROT 7.3 06/17/2017 1321   ALBUMIN 4.0 06/17/2017 1321   AST 25 06/17/2017 1321   ALT 21 06/17/2017 1321   ALKPHOS 118 06/17/2017 1321   BILITOT 0.7 06/17/2017 1321   GFRNONAA >60 08/29/2017 1329   GFRAA >60 08/29/2017 1329       PENDING LABS:   RADIOGRAPHIC STUDIES:  No results found.     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  Plan -Clinically NED. No evidence of recurrent laryngeal disease. Pulm nodules are stable. Plan to repeat his CT soft tissue neck and CT chest in 3 months.  Prostate cancer [facility]) Prostate cancer, S/P definitive XRT finishing in Jan 2018. No hematuria and no new urinary symptoms  Plan  -PSA low at 0.02 in August 2018. Continue follow-up with urology.    Polycythemia, secondary Secondary polycythemia secondary to smoking has reports SINCE patient has quit smoking.    Plan: - Hemoglobin elevated today likely due to smoking.  Pulmonary Nodules - Pulmonary nodules stable on repeat CT. Repeat surveillance scan in 3 months with a CT chest with contrast. - No hypermetabolic activity was seen in the pulmonary nodules on recent PET scan.  Infrarenal abdominal aortic aneurysm- 3.7 cm -Patient made aware that he has an abdominal aortic aneurysm as seen on recent PET scan. He will need a repeat abdominal ultrasound in 2 years per guidelines for continued surveillance.  Smoking -Strongly counseled patient again on efforts for  smoke cessation, he verbalized understanding.  Return to clinic in 3 months for continued follow-up to review labs and CT results.  Orders Placed This Encounter  Procedures  . CT Chest W Contrast    Standing Status:   Future    Standing Expiration Date:   09/15/2018    Order Specific Question:   If indicated for the ordered procedure, I authorize the administration of contrast media per Radiology protocol    Answer:   Yes    Order Specific Question:   Preferred imaging location?    Answer:   [facility]    Order Specific Question:   Radiology Contrast Protocol - do NOT remove file path    Answer:   [email or web address]  . CT SOFT TISSUE NECK W CONTRAST    Standing Status:   Future    Standing Expiration Date:   12/16/2018    Order Specific Question:   If indicated for the ordered procedure, I authorize the administration of contrast media per Radiology protocol    Answer:   Yes    Order Specific Question:   Preferred imaging location?    Answer:   [facility]    Order Specific Question:   Radiology Contrast Protocol - do NOT remove file path    Answer:   [email or web address]  . CBC with Differential    Standing Status:   Future    Standing Expiration Date:   09/15/2018  . Comprehensive metabolic panel    Standing Status:   Future    Standing Expiration Date:    09/15/2018      All questions were answered. The patient knows to call the clinic with any problems, questions or concerns. We can certainly see the patient much sooner if necessary.  This note was electronically signed.  Twana First, MD 09/15/2017

## 2017-09-16 ENCOUNTER — Ambulatory Visit (HOSPITAL_COMMUNITY): Payer: Medicare Other

## 2017-09-16 LAB — PSA: PROSTATIC SPECIFIC ANTIGEN: 0.01 ng/mL (ref 0.00–4.00)

## 2017-09-17 ENCOUNTER — Ambulatory Visit (HOSPITAL_COMMUNITY)
Admission: RE | Admit: 2017-09-17 | Discharge: 2017-09-17 | Disposition: A | Discharge status: Home / Self Care | Payer: Medicare Other | Source: Ambulatory Visit | Attending: Pulmonary Disease | Admitting: Pulmonary Disease

## 2017-09-17 DIAGNOSIS — M5136 Other intervertebral disc degeneration, lumbar region: Secondary | ICD-10-CM | POA: Insufficient documentation

## 2017-09-17 DIAGNOSIS — M48061 Spinal stenosis, lumbar region without neurogenic claudication: Secondary | ICD-10-CM | POA: Insufficient documentation

## 2017-09-17 DIAGNOSIS — M4856XA Collapsed vertebra, not elsewhere classified, lumbar region, initial encounter for fracture: Secondary | ICD-10-CM | POA: Insufficient documentation

## 2017-09-17 DIAGNOSIS — M961 Postlaminectomy syndrome, not elsewhere classified: Secondary | ICD-10-CM | POA: Insufficient documentation

## 2017-09-17 DIAGNOSIS — M545 Low back pain: Secondary | ICD-10-CM | POA: Diagnosis not present

## 2017-09-17 DIAGNOSIS — Z8546 Personal history of malignant neoplasm of prostate: Secondary | ICD-10-CM | POA: Diagnosis not present

## 2017-09-17 DIAGNOSIS — R937 Abnormal findings on diagnostic imaging of other parts of musculoskeletal system: Secondary | ICD-10-CM | POA: Diagnosis not present

## 2017-09-24 ENCOUNTER — Ambulatory Visit (INDEPENDENT_AMBULATORY_CARE_PROVIDER_SITE_OTHER): Payer: Medicare Other | Admitting: Urology

## 2017-09-24 DIAGNOSIS — C61 Malignant neoplasm of prostate: Secondary | ICD-10-CM | POA: Diagnosis not present

## 2017-09-26 DIAGNOSIS — M545 Low back pain: Secondary | ICD-10-CM | POA: Diagnosis not present

## 2017-09-26 DIAGNOSIS — M5416 Radiculopathy, lumbar region: Secondary | ICD-10-CM | POA: Diagnosis not present

## 2017-11-24 DIAGNOSIS — M545 Low back pain: Secondary | ICD-10-CM | POA: Diagnosis not present

## 2017-11-27 ENCOUNTER — Other Ambulatory Visit (HOSPITAL_COMMUNITY): Payer: Self-pay | Admitting: Orthopedic Surgery

## 2017-11-27 DIAGNOSIS — M545 Low back pain: Secondary | ICD-10-CM

## 2017-12-02 ENCOUNTER — Other Ambulatory Visit (HOSPITAL_COMMUNITY): Payer: Self-pay | Admitting: Pulmonary Disease

## 2017-12-02 DIAGNOSIS — M159 Polyosteoarthritis, unspecified: Secondary | ICD-10-CM

## 2017-12-04 ENCOUNTER — Ambulatory Visit (INDEPENDENT_AMBULATORY_CARE_PROVIDER_SITE_OTHER): Payer: Medicare Other | Admitting: Otolaryngology

## 2017-12-04 DIAGNOSIS — M545 Low back pain: Secondary | ICD-10-CM | POA: Diagnosis not present

## 2017-12-04 DIAGNOSIS — N183 Chronic kidney disease, stage 3 (moderate): Secondary | ICD-10-CM | POA: Diagnosis not present

## 2017-12-04 DIAGNOSIS — Z8521 Personal history of malignant neoplasm of larynx: Secondary | ICD-10-CM | POA: Diagnosis not present

## 2017-12-04 DIAGNOSIS — J449 Chronic obstructive pulmonary disease, unspecified: Secondary | ICD-10-CM | POA: Diagnosis not present

## 2017-12-04 DIAGNOSIS — F17210 Nicotine dependence, cigarettes, uncomplicated: Secondary | ICD-10-CM

## 2017-12-04 DIAGNOSIS — C61 Malignant neoplasm of prostate: Secondary | ICD-10-CM | POA: Diagnosis not present

## 2017-12-05 ENCOUNTER — Ambulatory Visit (HOSPITAL_COMMUNITY)
Admission: RE | Admit: 2017-12-05 | Discharge: 2017-12-05 | Disposition: A | Discharge status: Home / Self Care | Payer: Medicare Other | Source: Ambulatory Visit | Attending: Pulmonary Disease | Admitting: Pulmonary Disease

## 2017-12-05 ENCOUNTER — Ambulatory Visit (HOSPITAL_COMMUNITY)
Admission: RE | Admit: 2017-12-05 | Discharge: 2017-12-05 | Disposition: A | Discharge status: Home / Self Care | Payer: Medicare Other | Source: Ambulatory Visit | Attending: Orthopedic Surgery | Admitting: Orthopedic Surgery

## 2017-12-05 DIAGNOSIS — M47816 Spondylosis without myelopathy or radiculopathy, lumbar region: Secondary | ICD-10-CM | POA: Insufficient documentation

## 2017-12-05 DIAGNOSIS — X58XXXA Exposure to other specified factors, initial encounter: Secondary | ICD-10-CM | POA: Insufficient documentation

## 2017-12-05 DIAGNOSIS — M4856XA Collapsed vertebra, not elsewhere classified, lumbar region, initial encounter for fracture: Secondary | ICD-10-CM | POA: Insufficient documentation

## 2017-12-05 DIAGNOSIS — R609 Edema, unspecified: Secondary | ICD-10-CM | POA: Diagnosis not present

## 2017-12-05 DIAGNOSIS — M545 Low back pain: Secondary | ICD-10-CM

## 2017-12-05 DIAGNOSIS — M159 Polyosteoarthritis, unspecified: Secondary | ICD-10-CM | POA: Insufficient documentation

## 2017-12-05 DIAGNOSIS — M5126 Other intervertebral disc displacement, lumbar region: Secondary | ICD-10-CM | POA: Insufficient documentation

## 2017-12-05 DIAGNOSIS — M810 Age-related osteoporosis without current pathological fracture: Secondary | ICD-10-CM | POA: Diagnosis not present

## 2017-12-09 ENCOUNTER — Other Ambulatory Visit (HOSPITAL_COMMUNITY): Payer: Self-pay | Admitting: *Deleted

## 2017-12-09 DIAGNOSIS — C61 Malignant neoplasm of prostate: Secondary | ICD-10-CM

## 2017-12-10 ENCOUNTER — Ambulatory Visit (HOSPITAL_COMMUNITY)
Admission: RE | Admit: 2017-12-10 | Discharge: 2017-12-10 | Disposition: A | Discharge status: Home / Self Care | Payer: Medicare Other | Source: Ambulatory Visit | Attending: Oncology | Admitting: Oncology

## 2017-12-10 ENCOUNTER — Other Ambulatory Visit (HOSPITAL_COMMUNITY)
Admission: RE | Admit: 2017-12-10 | Discharge: 2017-12-10 | Disposition: A | Discharge status: Home / Self Care | Payer: Medicare Other | Source: Ambulatory Visit | Attending: Pulmonary Disease | Admitting: Pulmonary Disease

## 2017-12-10 ENCOUNTER — Inpatient Hospital Stay (HOSPITAL_COMMUNITY): Payer: Medicare Other | Attending: Internal Medicine

## 2017-12-10 DIAGNOSIS — C760 Malignant neoplasm of head, face and neck: Secondary | ICD-10-CM | POA: Diagnosis not present

## 2017-12-10 DIAGNOSIS — I700 Atherosclerosis of aorta: Secondary | ICD-10-CM | POA: Insufficient documentation

## 2017-12-10 DIAGNOSIS — Z87891 Personal history of nicotine dependence: Secondary | ICD-10-CM | POA: Insufficient documentation

## 2017-12-10 DIAGNOSIS — I2510 Atherosclerotic heart disease of native coronary artery without angina pectoris: Secondary | ICD-10-CM | POA: Insufficient documentation

## 2017-12-10 DIAGNOSIS — I714 Abdominal aortic aneurysm, without rupture: Secondary | ICD-10-CM | POA: Insufficient documentation

## 2017-12-10 DIAGNOSIS — C44329 Squamous cell carcinoma of skin of other parts of face: Secondary | ICD-10-CM | POA: Diagnosis not present

## 2017-12-10 DIAGNOSIS — Z8546 Personal history of malignant neoplasm of prostate: Secondary | ICD-10-CM | POA: Diagnosis not present

## 2017-12-10 DIAGNOSIS — C320 Malignant neoplasm of glottis: Secondary | ICD-10-CM

## 2017-12-10 DIAGNOSIS — I252 Old myocardial infarction: Secondary | ICD-10-CM | POA: Insufficient documentation

## 2017-12-10 DIAGNOSIS — Z905 Acquired absence of kidney: Secondary | ICD-10-CM | POA: Insufficient documentation

## 2017-12-10 DIAGNOSIS — R918 Other nonspecific abnormal finding of lung field: Secondary | ICD-10-CM | POA: Diagnosis not present

## 2017-12-10 DIAGNOSIS — Z87442 Personal history of urinary calculi: Secondary | ICD-10-CM | POA: Diagnosis not present

## 2017-12-10 DIAGNOSIS — J449 Chronic obstructive pulmonary disease, unspecified: Secondary | ICD-10-CM | POA: Diagnosis not present

## 2017-12-10 DIAGNOSIS — Z79899 Other long term (current) drug therapy: Secondary | ICD-10-CM | POA: Diagnosis not present

## 2017-12-10 DIAGNOSIS — C61 Malignant neoplasm of prostate: Secondary | ICD-10-CM

## 2017-12-10 DIAGNOSIS — Z923 Personal history of irradiation: Secondary | ICD-10-CM | POA: Insufficient documentation

## 2017-12-10 DIAGNOSIS — J849 Interstitial pulmonary disease, unspecified: Secondary | ICD-10-CM | POA: Insufficient documentation

## 2017-12-10 DIAGNOSIS — Z8521 Personal history of malignant neoplasm of larynx: Secondary | ICD-10-CM | POA: Diagnosis not present

## 2017-12-10 LAB — CBC WITH DIFFERENTIAL/PLATELET
BASOS ABS: 0 10*3/uL (ref 0.0–0.1)
BASOS PCT: 0 %
EOS ABS: 0.2 10*3/uL (ref 0.0–0.7)
EOS PCT: 5 %
HCT: 48.6 % (ref 39.0–52.0)
Hemoglobin: 16.4 g/dL (ref 13.0–17.0)
Lymphocytes Relative: 8 %
Lymphs Abs: 0.4 10*3/uL — ABNORMAL LOW (ref 0.7–4.0)
MCH: 31.8 pg (ref 26.0–34.0)
MCHC: 33.7 g/dL (ref 30.0–36.0)
MCV: 94.4 fL (ref 78.0–100.0)
MONOS PCT: 9 %
Monocytes Absolute: 0.5 10*3/uL (ref 0.1–1.0)
NEUTROS ABS: 4.1 10*3/uL (ref 1.7–7.7)
Neutrophils Relative %: 78 %
PLATELETS: 121 10*3/uL — AB (ref 150–400)
RBC: 5.15 MIL/uL (ref 4.22–5.81)
RDW: 13.4 % (ref 11.5–15.5)
WBC: 5.2 10*3/uL (ref 4.0–10.5)

## 2017-12-10 LAB — COMPREHENSIVE METABOLIC PANEL
ALT: 36 U/L (ref 17–63)
ANION GAP: 11 (ref 5–15)
AST: 39 U/L (ref 15–41)
Albumin: 4.1 g/dL (ref 3.5–5.0)
Alkaline Phosphatase: 126 U/L (ref 38–126)
BILIRUBIN TOTAL: 0.6 mg/dL (ref 0.3–1.2)
BUN: 23 mg/dL — ABNORMAL HIGH (ref 6–20)
CHLORIDE: 104 mmol/L (ref 101–111)
CO2: 22 mmol/L (ref 22–32)
Calcium: 10.8 mg/dL — ABNORMAL HIGH (ref 8.9–10.3)
Creatinine, Ser: 1.13 mg/dL (ref 0.61–1.24)
GFR calc Af Amer: >60 mL/min (ref >60)
GFR calc non Af Amer: >60 mL/min (ref >60)
Glucose, Bld: 134 mg/dL — ABNORMAL HIGH (ref 65–99)
POTASSIUM: 4.4 mmol/L (ref 3.5–5.1)
SODIUM: 137 mmol/L (ref 135–145)
TOTAL PROTEIN: 7.2 g/dL (ref 6.5–8.1)

## 2017-12-10 LAB — PSA: Prostatic Specific Antigen: <0.01 ng/mL (ref 0.00–4.00)

## 2017-12-10 MED ORDER — IOPAMIDOL (ISOVUE-300) INJECTION 61%
100.0000 mL | Freq: Once | INTRAVENOUS | Status: AC | PRN
  Administered 2017-12-10: 100 mL via INTRAVENOUS

## 2017-12-15 DIAGNOSIS — M545 Low back pain: Secondary | ICD-10-CM | POA: Diagnosis not present

## 2017-12-16 ENCOUNTER — Encounter (HOSPITAL_COMMUNITY): Payer: Self-pay | Admitting: Adult Health

## 2017-12-16 ENCOUNTER — Inpatient Hospital Stay (HOSPITAL_BASED_OUTPATIENT_CLINIC_OR_DEPARTMENT_OTHER): Payer: Medicare Other | Admitting: Adult Health

## 2017-12-16 VITALS — BP 139/84 | HR 117 | Temp 97.6°F | Resp 16 | Wt 154.8 lb

## 2017-12-16 DIAGNOSIS — Z79899 Other long term (current) drug therapy: Secondary | ICD-10-CM

## 2017-12-16 DIAGNOSIS — C61 Malignant neoplasm of prostate: Secondary | ICD-10-CM

## 2017-12-16 DIAGNOSIS — C320 Malignant neoplasm of glottis: Secondary | ICD-10-CM

## 2017-12-16 DIAGNOSIS — Z8546 Personal history of malignant neoplasm of prostate: Secondary | ICD-10-CM | POA: Diagnosis not present

## 2017-12-16 DIAGNOSIS — Z87891 Personal history of nicotine dependence: Secondary | ICD-10-CM | POA: Diagnosis not present

## 2017-12-16 DIAGNOSIS — Z9189 Other specified personal risk factors, not elsewhere classified: Secondary | ICD-10-CM

## 2017-12-16 DIAGNOSIS — R918 Other nonspecific abnormal finding of lung field: Secondary | ICD-10-CM | POA: Diagnosis not present

## 2017-12-16 DIAGNOSIS — Z8521 Personal history of malignant neoplasm of larynx: Secondary | ICD-10-CM

## 2017-12-16 DIAGNOSIS — Z923 Personal history of irradiation: Secondary | ICD-10-CM | POA: Diagnosis not present

## 2017-12-16 NOTE — Patient Instructions (Signed)
[name] at [facility] Discharge Instructions  RECOMMENDATIONS MADE BY THE CONSULTANT AND ANY TEST RESULTS WILL BE SENT TO YOUR REFERRING PHYSICIAN.  You saw Ivan Craze, NP, today. Please see Amy or Danielle at front desk for follow up appointments.  Thank you for choosing [place] at [facility] to provide your oncology and hematology care.  To afford each patient quality time with our provider, please arrive at least 15 minutes before your scheduled appointment time.    If you have a lab appointment with the [facility] please come in thru the  Main Entrance and check in at the main information desk  You need to re-schedule your appointment should you arrive 10 or more minutes late.  We strive to give you quality time with our providers, and arriving late affects you and other patients whose appointments are after yours.  Also, if you no show three or more times for appointments you may be dismissed from the clinic at the providers discretion.     Again, thank you for choosing [facility].  Our hope is that these requests will decrease the amount of time that you wait before being seen by our physicians.       _____________________________________________________________  Should you have questions after your visit to [facility], please contact our office at [phone] between the hours of 8:30 a.m. and 4:30 p.m.  Voicemails left after 4:30 p.m. will not be returned until the following business day.  For prescription refill requests, have your pharmacy contact our office.       Resources For Cancer Patients and their Caregivers ? American Cancer Society: Can assist with transportation, wigs, general needs, runs Look Good Feel Better.        830 376 5713 ? Cancer Care: Provides financial assistance, online support groups, medication/co-pay assistance.  1-800-813-HOPE 725-422-4901) ? [place] Assists [place] cancer patients and their families through emotional , educational and financial support.  (954)216-0026 ? [place] DSS Where to apply for food stamps, Medicaid and utility assistance. 680-712-4087 ? [facility]: Transportation to medical appointments. 332-502-7080 ? Social Security Administration: May apply for disability if have a Stage IV cancer. 380-228-8684 (779) 746-8777 ? [employer], Disability and Transit Services: Assists with nutrition, care and transit needs. [place] Support Programs: @10RELATIVEDAYS @ > Cancer Support Group  2nd Tuesday of the month 1pm-2pm, Journey Room  > Creative Journey  3rd Tuesday of the month 1130am-1pm, Journey Room  > Look Good Feel Better  1st Wednesday of the month 10am-12 noon, Journey Room (Call [facility] to register [phone])

## 2017-12-18 NOTE — Progress Notes (Signed)
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 There is probable supraglottic extension along the anterior false cords and suspected laryngeal cartilage erosion anteriorly at the level of of the commissure. No extension beyond the larynx, extension into the subglottic airway, or lymphadenopathy is identified.      08/06/2016 Imaging    CT chest- 1. Asymmetric interstitial changes involving the right lobe suggestive of interstitial lung disease. 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 A followup high-resolution CT of the chest is recommended in 6 months to assess for temporal change and provide a more detailed assessment of interstitial changes. 2. Enlarged mediastinal and right hilar lymph nodes. Nodes measure up to 1.5 cm which is considered pathologically enlarged by size criteria. In the setting of interstitial lung disease this is a nonspecific finding and may be reactive. 3. Aortic atherosclerosis, cardiac enlargement and multi vessel coronary artery calcification 4. Small pulmonary nodules are identified  in the right lung. The largest measures 6 mm. Attention at follow-up imaging advised.      08/15/2016 - 09/26/2016 Radiation Therapy    [name], [name]      12/27/2016 Imaging    CT chest high resolution- 1. Stable scattered pulmonary nodules, largest 6 mm, for which 4 month stability has been demonstrated. Recommend continued attention on follow-up chest CT in 6 months. 2. Interval stability of interstitial lung disease asymmetrically involving the right lung characterized by patchy subpleural reticulation and ground-glass attenuation with associated mild traction bronchiolectasis, no frank honeycombing and no clear basilar gradient. Findings are not appreciably changed at the lung bases compared to a 2015 CT abdomen study. 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 Follow-up high-resolution chest CT in 12 months recommended to ensure continued temporal pattern stability. 3. Stable mild mediastinal lymphadenopathy, most compatible with benign reactive adenopathy. 4. Aortic atherosclerosis. Left main and 3 vessel coronary atherosclerosis.      03/10/2017 PET scan    1. Accentuated activity at the glottic level posteriorly in the midline, maximum SUV 13.4, without well-defined CT correlate. Lesser activity along the arytenoid cartilage region, left greater than right. The patient's prior cancer was centered somewhat anteriorly along the anterior commissure, in this region does not appear differentially hypermetabolic on today's exam compared to the rest of the glottic region. Accordingly, the activity present today may simply be physiologic but forms a reasonable baseline for future comparisons. No adenopathy in the neck. 2. Mildly enlarged right paratracheal lymph node is mildly hypermetabolic with a maximum SUV of 5.4 (compared to background mediastinal blood pool activity level of 2.9). 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 Surveillance is likely warranted. There is some activity slightly above background in the right hilum. 3. No hypermetabolic lung nodules are currently visible, including the 7 mm average size right lower lobe nodule adjacent to the major fissure. This is right at the borderline of sensitive PET-CT size thresholds. 4. 3.7 cm infrarenal abdominal aortic aneurysm. Recommend followup by [place] in 2 years. This recommendation follows ACR consensus guidelines: White Paper of the ACR Incidental Findings Committee II on Vascular Findings. J Am Coll Radiol 2013; 10:789-794. 5. Vaguely accentuated activity in the left side of the prostate gland. No pelvic adenopathy is observed. No hypermetabolic bony lesions. 6.  Aortic Atherosclerosis (ICD10-I70.0).  Coronary atherosclerosis. 7. Left nephrectomy.      06/12/2017 Imaging    CT Chest with contrast: IMPRESSION: 1. No definitive change in pulmonary nodules with the largest measuring 6 mm, taking difference in slice selection into account. Recommend continued attention on follow-up in 6 months. 2. Interstitial lung disease, unchanged in the interval. 3. A few mildly enlarged mediastinal nodes as above are nonspecific but may be reactive. 4. Aortic atherosclerosis, cardiac enlargement, and multi vessel coronary artery disease.  Aortic Atherosclerosis (ICD10-I70.0).   Electronically Signed   By: Dorise Bullion III M.D   On: 06/12/2017 15:27      06/12/2017 Imaging    CT soft tissue neck: IMPRESSION: 1. Decreased enhancement and mass effect anterior commissure region of larynx. No new exophytic lesion or locally invasive mass to suggest recurrent/residual disease. No cervical lymphadenopathy by imaging criteria. 2. Bilateral carotid bifurcation atherosclerotic disease, right greater than left. Given radiation  treatment to the neck, consider follow-up with carotid Doppler to ensure continued patency.   Electronically Signed   By: Kristine Garbe M.D.   On: 06/12/2017 17:01       Prostate cancer (Ivan [name])   08/11/2016 Initial Diagnosis    Prostate cancer (Ivan [name])      09/23/2016 - 11/22/2016 Radiation Therapy    [name], [place]         INTERVAL HISTORY:  Mr. Jezewski 72 y.o. male returns for routine follow-up for laryngeal cancer.   Here today with his brother.   Overall, he tells me he has been doing "pretty good."  Appetite 75%; energy levels 25%.  His biggest complaint today is his back pain, which has been chronic.  He has seen neurosurgery specialists in [place] and they have recommended surgery for him; he tells me he is supposed to have surgery soon.    Denies any sore throat, neck adenopathy, cough, or shortness of breath.  Denies any urinary changes or complaints.  He is eating well and enjoys a variety of foods.  Last saw his ENT physician ~3 months ago.  He tells me that "he put that light up my nose and down my throat and told me everything looked good."  He is due to return to ENT's office in ~3 months per patient.  Mr. Looper is wondering if it is okay for him to see [place] a little less often, because it is difficult for him to get to here from [place]. He relies on family members to bring him to his appointments, some of which have to take time off of work to bring him.    He recently had CT scans done and would like to review those results [place] today.  Otherwise, he is largely without other  complaints today.     REVIEW OF SYSTEMS:  Review of Systems  Constitutional: Positive for fatigue. Negative for chills and fever.  HENT:  Negative.  Negative for lump/mass and nosebleeds.   Eyes: Negative.   Respiratory: Negative.  Negative for cough and shortness of breath.   Cardiovascular: Negative.  Negative for chest pain and leg swelling.  Gastrointestinal: Negative.   Negative for abdominal pain, blood in stool, constipation, diarrhea, nausea and vomiting.  Endocrine: Negative.   Genitourinary: Negative.  Negative for dysuria and hematuria.   Musculoskeletal: Positive for back pain. Negative for arthralgias.  Skin: Negative.  Negative for rash.  Neurological: Negative.  Negative for dizziness and headaches.  Hematological: Negative.  Negative for adenopathy. Does not bruise/bleed easily.  Psychiatric/Behavioral: Negative.  Negative for depression and sleep disturbance. The patient is not nervous/anxious.      PAST MEDICAL/SURGICAL HISTORY:  Past Medical History:  Diagnosis Date  . Arthritis   . Chronic back pain   . COPD (chronic obstructive pulmonary disease) ([place] [facility])   . Hypercalcemia   . Kidney stones   . Myocardial infarction ([place])   . 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 Prostate cancer ([facility]) 08/11/2016  . Sepsis ([place])   . Squamous cell carcinoma of left vocal cord ([facility]) 07/25/2016  . UTI (lower urinary tract infection)    Past Surgical History:  Procedure Laterality Date  . APPENDECTOMY    . BACK SURGERY     10/2013  . GOLD SEED IMPLANT N/A 08/21/2016   Procedure: GOLD SEED IMPLANT;  Surgeon: Cleon Gustin, MD;  Location: [facility];  Service: Urology;  Laterality: N/A;  . HERNIA REPAIR    . MICROLARYNGOSCOPY Bilateral 07/23/2016   Procedure: MICRO DIRECT LARYNGOSCOPY WITH BIOPSY;  Surgeon: Leta Baptist, MD;  Location: [facility];  Service: ENT;  Laterality: Bilateral;  . NEPHRECTOMY Left    04/2014  . PROSTATE BIOPSY N/A 06/05/2016   Procedure: PROSTATE BIOPSY;  Surgeon: Cleon Gustin, MD;  Location: [facility];  Service: Urology;  Laterality: N/A;  . TRANSRECTAL ULTRASOUND N/A 08/21/2016   Procedure: TRANSRECTAL ULTRASOUND;  Surgeon: Cleon Gustin, MD;  Location: [facility];  Service: Urology;  Laterality: N/A;  . vocal cord biopsy  06/2016     SOCIAL HISTORY:  Social History   Socioeconomic History  . Marital status:  Married    Spouse name: Not on file  . Number of children: Not on file  . Years of education: Not on file  . Highest education level: Not on file  Social Needs  . Financial resource strain: Not on file  . Food insecurity - worry: Not on file  . Food insecurity - inability: Not on file  . Transportation needs - medical: Not on file  . Transportation needs - non-medical: Not on file  Occupational History  . Not on file  Tobacco Use  . Smoking status: Former Smoker    Packs/day: 1.00    Years: 50.00    Pack years: 50.00    Types: Cigarettes    Last attempt to quit: 08/24/2017    Years since quitting: 0.3  . Smokeless tobacco: Never Used  Substance and Sexual Activity  . Alcohol use: No  . Drug use: No  . Sexual activity: Yes  Other Topics Concern  . Not on file  Social History Narrative  . Not on file    FAMILY HISTORY:  Family History  Problem Relation Age of Onset  . Cancer Mother   .  Cancer Brother     CURRENT MEDICATIONS:  Outpatient Encounter Medications as of 12/16/2017  Medication Sig Note  . acetaminophen (TYLENOL) 500 MG tablet Take 500 mg by mouth every 6 (six) hours as needed (for pain.).   Marland Kitchen ALPRAZolam (XANAX) 1 MG tablet Take 1 mg by mouth at bedtime.    Marland Kitchen atorvastatin (LIPITOR) 20 MG tablet Take 20 mg by mouth daily.   Marland Kitchen lidocaine (LIDODERM) 5 % Place 1 patch onto the skin daily. Remove & Discard patch within 12 hours or as directed by MD   . Melatonin 5 MG TABS Take 5 mg by mouth at bedtime as needed (for sleep).   . Multiple Vitamin (MULTIVITAMIN WITH MINERALS) TABS tablet Take 1 tablet by mouth daily.   . nitrofurantoin, macrocrystal-monohydrate, (MACROBID) 100 MG capsule Take 1 capsule (100 mg total) by mouth 2 (two) times daily. (Patient taking differently: Take 100 mg by mouth daily. )   . ondansetron (ZOFRAN) 4 MG tablet Take 4 mg by mouth every 8 (eight) hours as needed for nausea or vomiting.  11/04/2016: Received from: External Pharmacy  . Oxycodone  HCl 10 MG TABS Take 10 mg by mouth every 6 (six) hours as needed (pain).    . polyethylene glycol (MIRALAX / GLYCOLAX) packet Take 17 g by mouth daily.    . Potassium Gluconate 595 MG CAPS Take 595 mg by mouth daily.   . predniSONE (DELTASONE) 10 MG tablet TAKE 4 TABS FOR 3 DAYS, 3 TABS FOR 3 DAYS, 2 TABS FOR 3 DAYS, 1 TAB FOR 3 DAYS    No facility-administered encounter medications on file as of 12/16/2017.     ALLERGIES:  No Known Allergies   PHYSICAL EXAM:  ECOG Performance status: 1 - Symptomatic; remains largely independent in ADLs, but does not drive.   Vitals:   12/16/17 1444  BP: 139/84  Pulse: (!) 117  Resp: 16  Temp: 97.6 F (36.4 C)  SpO2: 98%   Filed Weights   12/16/17 1444  Weight: 154 lb 12.8 oz (70.2 kg)    Physical Exam  Constitutional: He is oriented to person, place, and time and well-developed, well-nourished, and in no distress.  Exam done with patient seated in chair, as his back pain limits his mobility to get onto exam table comfortably  HENT:  Head: Normocephalic.  Mouth/Throat: Oropharynx is clear and moist. No oropharyngeal exudate.  Eyes: Conjunctivae are normal. Pupils are equal, round, and reactive to light. No scleral icterus.  Neck: Normal range of motion. Neck supple.  Cardiovascular: Regular rhythm.  Tachycardic   Pulmonary/Chest: Effort normal. No respiratory distress.  Diminished breath sounds bilat bases   Abdominal: Soft. Bowel sounds are normal. There is no tenderness.  Musculoskeletal: Normal range of motion. He exhibits no edema.  Lymphadenopathy:    He has no cervical adenopathy.       Right: No supraclavicular adenopathy present.       Left: No supraclavicular adenopathy present.  Neurological: He is alert and oriented to person, place, and time. No cranial nerve deficit.  Skin: Skin is warm and dry. No rash noted.  Psychiatric: Mood, memory, affect and judgment normal.  Nursing note and vitals reviewed.    LABORATORY  DATA:  I have reviewed the labs as listed.  CBC    Component Value Date/Time   WBC 5.2 12/10/2017 0939   RBC 5.15 12/10/2017 0939   HGB 16.4 12/10/2017 0939   HCT 48.6 12/10/2017 0939   PLT 121 (L) 12/10/2017  0939   MCV 94.4 12/10/2017 0939   MCH 31.8 12/10/2017 0939   MCHC 33.7 12/10/2017 0939   RDW 13.4 12/10/2017 0939   LYMPHSABS 0.4 (L) 12/10/2017 0939   MONOABS 0.5 12/10/2017 0939   EOSABS 0.2 12/10/2017 0939   BASOSABS 0.0 12/10/2017 0939   CMP Latest Ref Rng & Units 12/10/2017 09/15/2017 08/29/2017  Glucose 65 - 99 mg/dL 134(H) 156(H) 114(H)  BUN 6 - 20 mg/dL 23(H) 25(H) 21(H)  Creatinine 0.61 - 1.24 mg/dL 1.13 1.26(H) 1.09  Sodium 135 - 145 mmol/L 137 137 137  Potassium 3.5 - 5.1 mmol/L 4.4 4.8 4.6  Chloride 101 - 111 mmol/L 104 104 105  CO2 22 - 32 mmol/L 22 22 23   Calcium 8.9 - 10.3 mg/dL 10.8(H) 10.9(H) 10.9(H)  Total Protein 6.5 - 8.1 g/dL 7.2 6.9 -  Total Bilirubin 0.3 - 1.2 mg/dL 0.6 0.9 -  Alkaline Phos 38 - 126 U/L 126 126 -  AST 15 - 41 U/L 39 32 -  ALT 17 - 63 U/L 36 51 -    PENDING LABS:    DIAGNOSTIC IMAGING:  *The following radiologic images and reports have been reviewed independently and agree with below findings.  CT chest: 12/10/17 CLINICAL DATA:  72 year old male with history of squamous cell carcinoma of the left vocal cord status post surgery and radiation therapy in September 2017. Additional history of prostate cancer diagnosed in October 2017. History of COPD.  EXAM: CT CHEST WITH CONTRAST  TECHNIQUE: Multidetector CT imaging of the chest was performed during intravenous contrast administration.  CONTRAST:  141mL ISOVUE-300 IOPAMIDOL (ISOVUE-300) INJECTION 61%  COMPARISON:  Chest CT 06/12/2017.  FINDINGS: Cardiovascular: Heart size is normal. There is no significant pericardial fluid, thickening or pericardial calcification. There is aortic atherosclerosis, as well as atherosclerosis of the great vessels of the mediastinum  and the coronary arteries, including calcified atherosclerotic plaque in the left main, left anterior descending, left circumflex and right coronary arteries. Calcifications of the mitral annulus  Mediastinum/Nodes: No pathologically enlarged mediastinal or hilar lymph nodes. Esophagus is unremarkable in appearance. No axillary lymphadenopathy.  Lungs/Pleura: Several small pulmonary nodules are again noted throughout the lungs bilaterally, unchanged in size, number and distribution. The largest of these is a 6 x 4 mm nodule in the right upper lobe (axial image 49 of series 4). Previously described nodule in the periphery of the right lower lobe is actually only an area of peripheral fibrosis (axial image 83 of series 4), and is stable compared to the prior examination. There continues to be patchy ground-glass attenuation, septal thickening, thickening of the peribronchovascular interstitium and mild cylindrical bronchiectasis throughout the right lung, slightly increased compared to the prior study. No honeycombing. Minimal septal thickening noted in the left lung base as well. No acute consolidative airspace disease. No pleural effusions.  Upper Abdomen: Aortic atherosclerosis.  Musculoskeletal: There are no aggressive appearing lytic or blastic lesions noted in the visualized portions of the skeleton.  IMPRESSION: 1. All previously noted pulmonary nodules are stable in size, number and distribution compared to prior studies. These are statistically likely benign. No new suspicious appearing pulmonary nodules or masses are noted. 2. Unusual pattern of asymmetric interstitial lung disease involving the right lung to a much greater extent than the left. The pattern is nonspecific, but appears slightly progressive. Primary differential considerations are that of chronic hypersensitivity pneumonitis and nonspecific interstitial pneumonia (NSIP), both of which are typically  much more symmetric. Repeat high-resolution chest CT is recommended in  12 months to reassess for temporal changes in the appearance of the lung parenchyma. 3. Aortic atherosclerosis, in addition to left main and 3 vessel coronary artery disease. Please note that although the presence of coronary artery calcium documents the presence of coronary artery disease, the severity of this disease and any potential stenosis cannot be assessed on this non-gated CT examination. Assessment for potential risk factor modification, dietary therapy or pharmacologic therapy may be warranted, if clinically indicated. 4. There are calcifications of the aortic valve. Echocardiographic correlation for evaluation of potential valvular dysfunction may be warranted if clinically indicated.  Aortic Atherosclerosis (ICD10-I70.0).   Electronically Signed   By: Vinnie Langton M.D.   On: 12/10/2017 13:29   CT neck: 12/10/17 CLINICAL DATA:  72 y/o M; squamous cell carcinoma of the left vocal cord post surgery and radiation treatment 9/17. History of prostate cancer, COPD, and left nephrectomy. Hypermetabolic left paratracheal lymph node.  EXAM: CT NECK WITH CONTRAST  TECHNIQUE: Multidetector CT imaging of the neck was performed using the standard protocol following the bolus administration of intravenous contrast.  CONTRAST:  122mL ISOVUE-300 IOPAMIDOL (ISOVUE-300) INJECTION 61%  COMPARISON:  08/05/2016 CT of the neck.  03/10/2017 PET-CT.  FINDINGS: Pharynx and larynx: Decreased mucosal enhancement and mass effect in the anterior commissure region of prior squamous cell carcinoma. No new exophytic lesion, effacement of paraglottic fat, or cartilage erosion to suggest recurrent/residual disease.  Salivary glands: No inflammation, mass, or stone.  Thyroid: Normal.  Lymph nodes: None enlarged or abnormal density.  Vascular: The mixed plaque of the bilateral carotid bifurcations with  mild less than 50% left-sided proximal ICA stenosis and mild-to-moderate right-sided approximately 50% proximal ICA stenosis.  Limited intracranial: Negative.  Visualized orbits: Negative.  Mastoids and visualized paranasal sinuses: Clear.  Skeleton: Mild cervical spondylosis with disc and facet degenerative changes. No high-grade bony canal stenosis. Uncovertebral and facet hypertrophy encroaches on the neural foramen bilaterally most pronounced at the C3-4 level.  Upper chest: Partially visualize interstitial lung disease is similar to the prior CT greatest in the right upper lobe.  Other: None.  IMPRESSION: 1. Decreased enhancement and mass effect anterior commissure region of larynx. No new exophytic lesion or locally invasive mass to suggest recurrent/residual disease. No cervical lymphadenopathy by imaging criteria. 2. Bilateral carotid bifurcation atherosclerotic disease, right greater than left. Given radiation treatment to the neck, consider follow-up with carotid Doppler to ensure continued patency.   Electronically Signed   By: Kristine Garbe M.D.   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 Treated with definitive radiation therapy in [place], [place] from 08/15/16-09/26/16.  -No clinical evidence of recurrent disease; no evidence of neck adenopathy.  Most recent CT neck done on 12/10/17 showed no evidence of disease. We reviewed his imaging together today in detail; he was also provided a copy of the radiologic report.   -Continues to follow with ENT per his report. He will see Dr. Benjamine Mola with ENT in ~3 months.   -Given that it is difficult for patient to get to [facility] from his home in [place], and also relies on family members to bring him to his appointments, I feel comfortable having him follow-up in 5  months with [place]. Stressed the importance of maintaining his ENT appointments, as their physical exam with laryngoscopy is invaluable in  monitoring for recurrence. His brother, who is present today and helps provide transportation support for Mr. Harker, confirms this follow-up schedule with ENT and with [place].  No role for additional CT imaging, unless clinically indicated with new/concerning symptoms or physical exam findings.  He agrees with this plan.   -Return to cancer center in 5 months for follow-up with labs.  Will collect TSH for screening, as he is at increased risk of thyroid dysfunction s/p XRT to the neck.    Lung nodules/Interstitial lung disease:  -Most recent CT chest on 12/10/17 showed stability of lung nodules, which makes them likely benign. No new suspicious lung nodules or masses noted, which is reassuring.  -On CT imaging, there is evidence of asymmetric interstitial lung disease, which appears slightly more progressed than on his last CT imaging.  He also has evidence of coronary artery disease and aortic atherosclerosis. All of his CT results were reviewed today in detail with the patient and he was provided a copy of the radiologic report as well.   -Radiology recommends repeat CT chest in 1 year to assess stability of lung disease. This can either be ordered by [place] in oncology or by his PCP, Dr. Luan Pulling.     Prostate cancer:  -Treated definitively with radiation therapy; completed treatment 10/2016.  -PSA remains very low at <0.01. No clinical signs/symptoms concerning for recurrent disease. No urinary complaints today.  Will continue to monitor serial PSA.  -Recent MRI spine reviewed; back pain not concerning for metastatic disease, but he does have evidence of endplate compression fractures and worsening spondylosis.  This is being managed and followed by neurosurgery, with plans for possible upcoming surgery.  Encouraged continued follow-up with their team as they deem appropriate.       Dispo:  -Maintain follow-up with ENT as scheduled.  -Return to cancer center in 5 months for follow-up with labs.    All questions were answered to patient's stated satisfaction. Encouraged patient to call with any new concerns or questions before his next visit to the cancer center and we can certain see him sooner, if needed.      Orders placed this encounter:  Orders Placed This Encounter  Procedures  . CBC with Differential/Platelet  . Comprehensive metabolic panel  . PSA  . TSH      Mike Craze, NP [facility] [phone]

## 2017-12-31 ENCOUNTER — Ambulatory Visit (INDEPENDENT_AMBULATORY_CARE_PROVIDER_SITE_OTHER): Payer: Medicare Other | Admitting: Urology

## 2017-12-31 DIAGNOSIS — C61 Malignant neoplasm of prostate: Secondary | ICD-10-CM | POA: Diagnosis not present

## 2018-03-25 ENCOUNTER — Other Ambulatory Visit (HOSPITAL_COMMUNITY)
Admission: RE | Admit: 2018-03-25 | Discharge: 2018-03-25 | Disposition: A | Discharge status: Home / Self Care | Payer: Medicare Other | Source: Ambulatory Visit | Attending: Pulmonary Disease | Admitting: Pulmonary Disease

## 2018-03-25 ENCOUNTER — Other Ambulatory Visit (HOSPITAL_COMMUNITY)
Admission: RE | Admit: 2018-03-25 | Discharge: 2018-03-25 | Disposition: A | Discharge status: Home / Self Care | Payer: Medicare Other | Source: Ambulatory Visit | Attending: Urology | Admitting: Urology

## 2018-03-25 DIAGNOSIS — M545 Low back pain: Secondary | ICD-10-CM | POA: Diagnosis not present

## 2018-03-25 DIAGNOSIS — C61 Malignant neoplasm of prostate: Secondary | ICD-10-CM | POA: Diagnosis not present

## 2018-03-25 DIAGNOSIS — F17200 Nicotine dependence, unspecified, uncomplicated: Secondary | ICD-10-CM | POA: Diagnosis not present

## 2018-03-25 DIAGNOSIS — N183 Chronic kidney disease, stage 3 (moderate): Secondary | ICD-10-CM | POA: Diagnosis not present

## 2018-03-25 DIAGNOSIS — J449 Chronic obstructive pulmonary disease, unspecified: Secondary | ICD-10-CM | POA: Insufficient documentation

## 2018-03-25 DIAGNOSIS — Z79891 Long term (current) use of opiate analgesic: Secondary | ICD-10-CM | POA: Insufficient documentation

## 2018-03-25 LAB — LIPID PANEL
Cholesterol: 168 mg/dL (ref 0–200)
HDL: 31 mg/dL — AB (ref >40)
LDL CALC: 106 mg/dL — AB (ref 0–99)
TRIGLYCERIDES: 155 mg/dL — AB (ref <150)
Total CHOL/HDL Ratio: 5.4 RATIO
VLDL: 31 mg/dL (ref 0–40)

## 2018-03-25 LAB — COMPREHENSIVE METABOLIC PANEL
ALBUMIN: 4 g/dL (ref 3.5–5.0)
ALT: 24 U/L (ref 17–63)
ANION GAP: 10 (ref 5–15)
AST: 25 U/L (ref 15–41)
Alkaline Phosphatase: 98 U/L (ref 38–126)
BUN: 25 mg/dL — ABNORMAL HIGH (ref 6–20)
CHLORIDE: 107 mmol/L (ref 101–111)
CO2: 21 mmol/L — AB (ref 22–32)
Calcium: 10.7 mg/dL — ABNORMAL HIGH (ref 8.9–10.3)
Creatinine, Ser: 1.06 mg/dL (ref 0.61–1.24)
GFR calc non Af Amer: >60 mL/min (ref >60)
GLUCOSE: 137 mg/dL — AB (ref 65–99)
Potassium: 4.5 mmol/L (ref 3.5–5.1)
SODIUM: 138 mmol/L (ref 135–145)
Total Bilirubin: 1.3 mg/dL — ABNORMAL HIGH (ref 0.3–1.2)
Total Protein: 7.2 g/dL (ref 6.5–8.1)

## 2018-03-25 LAB — PSA

## 2018-04-05 LAB — OXYCODONES,MS,WB/SP RFX
Oxycocone: 22.5 ng/mL
Oxycodones Confirmation: POSITIVE
Oxymorphone: NEGATIVE ng/mL

## 2018-04-05 LAB — DRUG SCREEN 10 W/CONF, SERUM
Amphetamines, IA: NEGATIVE ng/mL
BENZODIAZEPINES, IA: NEGATIVE ng/mL
Barbiturates, IA: NEGATIVE ug/mL
COCAINE & METABOLITE, IA: NEGATIVE ng/mL
Methadone, IA: NEGATIVE ng/mL
OPIATES, IA: NEGATIVE ng/mL
Oxycodones, IA: POSITIVE ng/mL
Phencyclidine, IA: NEGATIVE ng/mL
Propoxyphene, IA: NEGATIVE ng/mL
THC(MARIJUANA) METABOLITE, IA: NEGATIVE ng/mL

## 2018-04-08 ENCOUNTER — Ambulatory Visit (INDEPENDENT_AMBULATORY_CARE_PROVIDER_SITE_OTHER): Payer: Medicare Other | Admitting: Urology

## 2018-04-08 DIAGNOSIS — C61 Malignant neoplasm of prostate: Secondary | ICD-10-CM | POA: Diagnosis not present

## 2018-04-08 DIAGNOSIS — R351 Nocturia: Secondary | ICD-10-CM | POA: Diagnosis not present

## 2018-05-15 ENCOUNTER — Ambulatory Visit (HOSPITAL_COMMUNITY): Payer: Medicare Other | Admitting: Internal Medicine

## 2018-05-15 ENCOUNTER — Other Ambulatory Visit (HOSPITAL_COMMUNITY): Payer: Medicare Other

## 2018-05-28 ENCOUNTER — Other Ambulatory Visit (HOSPITAL_COMMUNITY): Payer: Medicare Other

## 2018-05-28 ENCOUNTER — Ambulatory Visit (HOSPITAL_COMMUNITY): Payer: Medicare Other | Admitting: Internal Medicine

## 2018-05-29 ENCOUNTER — Other Ambulatory Visit: Payer: Self-pay

## 2018-06-04 ENCOUNTER — Ambulatory Visit (INDEPENDENT_AMBULATORY_CARE_PROVIDER_SITE_OTHER): Payer: Medicare Other | Admitting: Otolaryngology

## 2018-06-04 ENCOUNTER — Encounter (HOSPITAL_COMMUNITY): Payer: Self-pay | Admitting: Internal Medicine

## 2018-06-04 ENCOUNTER — Inpatient Hospital Stay (HOSPITAL_COMMUNITY): Payer: Medicare Other | Attending: Hematology

## 2018-06-04 ENCOUNTER — Inpatient Hospital Stay (HOSPITAL_BASED_OUTPATIENT_CLINIC_OR_DEPARTMENT_OTHER): Payer: Medicare Other | Admitting: Internal Medicine

## 2018-06-04 VITALS — BP 123/53 | HR 95 | Temp 98.2°F | Resp 16 | Wt 155.1 lb

## 2018-06-04 DIAGNOSIS — C320 Malignant neoplasm of glottis: Secondary | ICD-10-CM

## 2018-06-04 DIAGNOSIS — D696 Thrombocytopenia, unspecified: Secondary | ICD-10-CM | POA: Diagnosis not present

## 2018-06-04 DIAGNOSIS — Z923 Personal history of irradiation: Secondary | ICD-10-CM

## 2018-06-04 DIAGNOSIS — Z8546 Personal history of malignant neoplasm of prostate: Secondary | ICD-10-CM | POA: Diagnosis not present

## 2018-06-04 DIAGNOSIS — R918 Other nonspecific abnormal finding of lung field: Secondary | ICD-10-CM | POA: Diagnosis not present

## 2018-06-04 DIAGNOSIS — F17210 Nicotine dependence, cigarettes, uncomplicated: Secondary | ICD-10-CM | POA: Insufficient documentation

## 2018-06-04 DIAGNOSIS — R5383 Other fatigue: Secondary | ICD-10-CM | POA: Diagnosis not present

## 2018-06-04 DIAGNOSIS — C61 Malignant neoplasm of prostate: Secondary | ICD-10-CM

## 2018-06-04 DIAGNOSIS — Z8521 Personal history of malignant neoplasm of larynx: Secondary | ICD-10-CM | POA: Diagnosis not present

## 2018-06-04 DIAGNOSIS — Z9189 Other specified personal risk factors, not elsewhere classified: Secondary | ICD-10-CM

## 2018-06-04 LAB — CBC WITH DIFFERENTIAL/PLATELET
BASOS ABS: 0 10*3/uL (ref 0.0–0.1)
BASOS PCT: 0 %
EOS ABS: 0.1 10*3/uL (ref 0.0–0.7)
Eosinophils Relative: 1 %
HCT: 49.5 % (ref 39.0–52.0)
Hemoglobin: 16.9 g/dL (ref 13.0–17.0)
Lymphocytes Relative: 3 %
Lymphs Abs: 0.4 10*3/uL — ABNORMAL LOW (ref 0.7–4.0)
MCH: 32.8 pg (ref 26.0–34.0)
MCHC: 34.1 g/dL (ref 30.0–36.0)
MCV: 95.9 fL (ref 78.0–100.0)
MONOS PCT: 7 %
Monocytes Absolute: 0.9 10*3/uL (ref 0.1–1.0)
NEUTROS ABS: 11.2 10*3/uL — AB (ref 1.7–7.7)
NEUTROS PCT: 89 %
Platelets: 101 10*3/uL — ABNORMAL LOW (ref 150–400)
RBC: 5.16 MIL/uL (ref 4.22–5.81)
RDW: 15 % (ref 11.5–15.5)
WBC: 12.6 10*3/uL — ABNORMAL HIGH (ref 4.0–10.5)

## 2018-06-04 LAB — COMPREHENSIVE METABOLIC PANEL
ALBUMIN: 3.7 g/dL (ref 3.5–5.0)
ALT: 30 U/L (ref 0–44)
ANION GAP: 6 (ref 5–15)
AST: 20 U/L (ref 15–41)
Alkaline Phosphatase: 81 U/L (ref 38–126)
BUN: 27 mg/dL — ABNORMAL HIGH (ref 8–23)
CHLORIDE: 103 mmol/L (ref 98–111)
CO2: 26 mmol/L (ref 22–32)
Calcium: 10.7 mg/dL — ABNORMAL HIGH (ref 8.9–10.3)
Creatinine, Ser: 1.3 mg/dL — ABNORMAL HIGH (ref 0.61–1.24)
GFR calc Af Amer: >60 mL/min (ref >60)
GFR calc non Af Amer: 54 mL/min — ABNORMAL LOW (ref >60)
GLUCOSE: 133 mg/dL — AB (ref 70–99)
POTASSIUM: 4.3 mmol/L (ref 3.5–5.1)
SODIUM: 135 mmol/L (ref 135–145)
Total Bilirubin: 1.8 mg/dL — ABNORMAL HIGH (ref 0.3–1.2)
Total Protein: 6.7 g/dL (ref 6.5–8.1)

## 2018-06-04 LAB — PSA: Prostatic Specific Antigen: <0.01 ng/mL (ref 0.00–4.00)

## 2018-06-04 LAB — TSH: TSH: 2.584 u[IU]/mL (ref 0.350–4.500)

## 2018-06-04 NOTE — Patient Instructions (Signed)
[facility] Discharge Instructions  You saw Dr. Higgs today.   Thank you for choosing [facility] at [facility] to provide your oncology and hematology care.  To afford each patient quality time with our provider, please arrive at least 15 minutes before your scheduled appointment time.   If you have a lab appointment with the Cancer Center please come in thru the  Main Entrance and check in at the main information desk  You need to re-schedule your appointment should you arrive 10 or more minutes late.  We strive to give you quality time with our providers, and arriving late affects you and other patients whose appointments are after yours.  Also, if you no show three or more times for appointments you may be dismissed from the clinic at the providers discretion.     Again, thank you for choosing [facility].  Our hope is that these requests will decrease the amount of time that you wait before being seen by our physicians.       _____________________________________________________________  Should you have questions after your visit to [facility], please contact our office at [phone] between the hours of 8:00 a.m. and 4:30 p.m.  Voicemails left after 4:00 p.m. will not be returned until the following business day.  For prescription refill requests, have your pharmacy contact our office and allow 72 hours.    Cancer Center Support Programs:   > Cancer Support Group  2nd Tuesday of the month 1pm-2pm, Journey Room    

## 2018-06-04 NOTE — Progress Notes (Signed)
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  Pt was diagnosed in 06/2016. Treated with definitive radiation therapy in [place], [place] from 08/15/16-09/26/16.   He is here for follow-up.  He should continue to follow-up with ENT as directed.  Labs done 06/04/2018 reviewed and showed white count 12.6 hemoglobin 16.9 hematocrit 49.5 platelets 101,000.  Chemistries within normal limits with a potassium 4.3, creatinine 1.3 liver function tests are normal.  TSH was normal at 2.584.  Patient had CT of the neck and chest done 12/10/2017 which showed no evidence of recurrent head and neck cancer.  He showed stable pulmonary nodules.  He will be set up for repeat imaging in February 2020 and will return to clinic to go over the results.  2.  Pulmonary nodules.  These were noted on CT of the chest that was done 12/10/2017 and were stable.  He will undergo repeat imaging in February 2020.  He continues to smoke and smoking cessation is recommended.  3.  URI.  Patient reports he was treated recently with an antibiotic by his primary care physician.  He reports some ongoing problems with congestion.  He denies any productive cough.  I recommended he try over-the-counter Zyrtec and to follow-up with his PCP if no improvement in symptoms.  He is afebrile in the clinic with a pulse ox of 99% on room air.  4.  Fatigue.  TSH was normal at 2.584.   Hemoglobin 16.9.  He should follow-up with PCP if ongoing  symptoms.  5.  Prostate cancer.  Pt was treated definitively with radiation therapy; completed treatment 10/2016.  PSA done 03/25/2018 was less than 0.01.  Will repeat PSA testing in February 2020.  He should follow-up with urology as directed. -PSA remains very low at <0.01.  6.  Thrombocytopenia.  Platelet count is 101,000.  He has had thrombocytopenia dating back to 2017.  Will repeat labs on RTC in 11/2017.  Further workup if plt count worsens.    7.  Smoking.  Cessation is recommended.  I have discussed with him head and neck cancers are associated with smoking.  Some of his congestion symptoms may also be attributable to his history of smoking.  He is set up for repeat imaging in February 2020.  Greater than 30 minutes was spent with more than 50% spent in counseling and coordination of care.  Interval History: 72 year old male previously followed by Dr. Talbert Cage.  He was diagnosed with squamous cell carcinoma of the left vocal cord based on a biopsy done in September 2017.  Treated with definitive radiation therapy in [place], [place] from 08/15/16-09/26/16.   Current Status: Patient is seen today for follow-up.  He is here to go over labs.  He is complaining of congestion and fatigue.  Denies any productive cough.  He continues to smoke.     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 There is probable supraglottic extension along the anterior false cords and suspected laryngeal cartilage erosion anteriorly at the level of of the commissure.  No extension beyond the larynx, extension into the subglottic airway, or lymphadenopathy is identified.    08/06/2016 Imaging    CT chest- 1. Asymmetric interstitial changes involving the right lobe suggestive of interstitial lung disease. 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 A followup high-resolution CT of the chest is recommended in 6 months to assess for temporal change and provide a more detailed assessment of interstitial changes. 2. Enlarged mediastinal and right hilar lymph nodes. Nodes measure up to 1.5 cm which is considered pathologically enlarged by size criteria. In the setting of interstitial lung disease this is a nonspecific finding and may be reactive. 3. Aortic atherosclerosis, cardiac enlargement and multi vessel coronary artery calcification 4. Small pulmonary nodules are identified in the right lung. The largest measures 6 mm. Attention at follow-up imaging advised.    08/15/2016 - 09/26/2016 Radiation Therapy    [name], [place]    12/27/2016 Imaging    CT chest high resolution- 1. Stable scattered pulmonary nodules, largest 6 mm, for which 4 month stability has been demonstrated. Recommend continued attention on follow-up chest CT in 6 months. 2. Interval stability of interstitial lung disease asymmetrically involving the right lung characterized by patchy subpleural reticulation and ground-glass attenuation with associated mild traction bronchiolectasis, no frank honeycombing and no clear basilar gradient. Findings are not appreciably changed at the lung bases compared to a 2015 CT abdomen study. 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 Follow-up high-resolution chest CT in 12 months recommended to ensure continued temporal pattern stability. 3. Stable mild mediastinal lymphadenopathy, most compatible with benign reactive adenopathy. 4. Aortic atherosclerosis. Left main and 3 vessel  coronary atherosclerosis.    03/10/2017 PET scan    1. Accentuated activity at the glottic level posteriorly in the midline, maximum SUV 13.4, without well-defined CT correlate. Lesser activity along the arytenoid cartilage region, left greater than right. The patient's prior cancer was centered somewhat anteriorly along the anterior commissure, in this region does not appear differentially hypermetabolic on today's exam compared to the rest of the glottic region. Accordingly, the activity present today may simply be physiologic but forms a reasonable baseline for future comparisons. No adenopathy in the neck. 2. Mildly enlarged right paratracheal lymph node is mildly hypermetabolic with a maximum SUV of 5.4 (compared to background mediastinal blood pool activity level of 2.9). 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 Surveillance is likely warranted. There is some activity slightly above background in the right hilum. 3. No hypermetabolic lung nodules are currently visible, including the 7 mm average size right lower lobe nodule adjacent to the major fissure. This is right at the borderline of sensitive PET-CT size thresholds. 4. 3.7 cm infrarenal abdominal aortic aneurysm. Recommend followup by [place] in 2 years. This recommendation follows ACR consensus guidelines: White Paper of the ACR Incidental Findings Committee II on Vascular Findings. J Am Coll Radiol 2013; 10:789-794. 5.  Vaguely accentuated activity in the left side of the prostate gland. No pelvic adenopathy is observed. No hypermetabolic bony lesions. 6.  Aortic Atherosclerosis (ICD10-I70.0).  Coronary atherosclerosis. 7. Left nephrectomy.    06/12/2017 Imaging    CT Chest with contrast: IMPRESSION: 1. No definitive change in pulmonary nodules with the largest measuring 6 mm,  taking difference in slice selection into account. Recommend continued attention on follow-up in 6 months. 2. Interstitial lung disease, unchanged in the interval. 3. A few mildly enlarged mediastinal nodes as above are nonspecific but may be reactive. 4. Aortic atherosclerosis, cardiac enlargement, and multi vessel coronary artery disease.  Aortic Atherosclerosis (ICD10-I70.0).   Electronically Signed   By: Dorise Bullion III M.D   On: 06/12/2017 15:27    06/12/2017 Imaging    CT soft tissue neck: IMPRESSION: 1. Decreased enhancement and mass effect anterior commissure region of larynx. No new exophytic lesion or locally invasive mass to suggest recurrent/residual disease. No cervical lymphadenopathy by imaging criteria. 2. Bilateral carotid bifurcation atherosclerotic disease, right greater than left. Given radiation treatment to the neck, consider follow-up with carotid Doppler to ensure continued patency.   Electronically Signed   By: Kristine Garbe M.D.   On: 06/12/2017 17:01     Prostate cancer ([place])   08/11/2016 Initial Diagnosis    Prostate cancer ([place])    09/23/2016 - 11/22/2016 Radiation Therapy    [name], [place]      Problem List Patient Active Problem List   Diagnosis Date Noted  . Pulmonary nodules/lesions, multiple [R91.8] 03/12/2017  . Tobacco abuse [Z72.0] 08/11/2016  . 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 Prostate cancer ([place]) [C61] 08/11/2016  . Squamous cell carcinoma of left vocal cord ([place]) [C32.0] 07/25/2016  . Acute renal failure ([place]) [N17.9] 09/15/2014  . UTI (lower urinary tract infection) [N39.0] 09/15/2014  . Dehydration [E86.0] 09/15/2014  . Elevated troponin [R74.8] 09/15/2014  . Thrombocytopenia ([place]) [D69.6] 09/15/2014  . Hyponatremia [E87.1] 09/15/2014  . Sepsis ([place]) [A41.9] 09/15/2014  . Acute encephalopathy [G93.40] 09/15/2014  . NSTEMI (non-ST elevated myocardial infarction) ([place]) [I21.4]     Past  Medical History Past Medical History:  Diagnosis Date  . Arthritis   . Chronic back pain   . COPD (chronic obstructive pulmonary disease) ([place])   . Hypercalcemia   . Kidney stones   . Myocardial infarction ([place])   . 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 Prostate cancer ([place]) 08/11/2016  . Sepsis ([place])   . Squamous cell carcinoma of left vocal cord ([place]) 07/25/2016  . UTI (lower urinary tract infection)     Past Surgical History Past Surgical History:  Procedure Laterality Date  . APPENDECTOMY    . BACK SURGERY     10/2013  . GOLD SEED IMPLANT N/A 08/21/2016   Procedure: GOLD SEED IMPLANT;  Surgeon: Cleon Gustin, MD;  Location: [facility];  Service: Urology;  Laterality: N/A;  . HERNIA REPAIR    . MICROLARYNGOSCOPY Bilateral 07/23/2016   Procedure: MICRO DIRECT LARYNGOSCOPY WITH BIOPSY;  Surgeon: Leta Baptist, MD;  Location: [facility];  Service: ENT;  Laterality: Bilateral;  . NEPHRECTOMY Left    04/2014  . PROSTATE BIOPSY N/A 06/05/2016   Procedure: PROSTATE BIOPSY;  Surgeon: Cleon Gustin, MD;  Location: [facility];  Service: Urology;  Laterality: N/A;  . TRANSRECTAL ULTRASOUND N/A 08/21/2016   Procedure: TRANSRECTAL ULTRASOUND;  Surgeon: Cleon Gustin, MD;  Location: [facility];  Service: Urology;  Laterality: N/A;  . vocal cord biopsy  06/2016  Family History Family History  Problem Relation Age of Onset  . Cancer Mother   . Cancer Brother      Social History  reports that he has been smoking cigarettes. He has a 50.00 pack-year smoking history. He has never used smokeless tobacco. He reports that he does not drink alcohol or use drugs.  Medications  Current Outpatient Medications:  .  acetaminophen (TYLENOL) 500 MG tablet, Take 500 mg by mouth every 6 (six) hours as needed (for pain.)., Disp: , Rfl:  .  ALPRAZolam (XANAX) 1 MG tablet, Take 1 mg by mouth at bedtime. , Disp: , Rfl: 5 .  atorvastatin (LIPITOR) 20 MG tablet, Take 20 mg by mouth daily.,  Disp: , Rfl:  .  lidocaine (LIDODERM) 5 %, Place 1 patch onto the skin daily. Remove & Discard patch within 12 hours or as directed by MD, Disp: 30 patch, Rfl: 0 .  Melatonin 5 MG TABS, Take 5 mg by mouth at bedtime as needed (for sleep)., Disp: , Rfl:  .  Multiple Vitamin (MULTIVITAMIN WITH MINERALS) TABS tablet, Take 1 tablet by mouth daily., Disp: , Rfl:  .  nitrofurantoin, macrocrystal-monohydrate, (MACROBID) 100 MG capsule, Take 1 capsule (100 mg total) by mouth 2 (two) times daily. (Patient taking differently: Take 100 mg by mouth daily. ), Disp: 30 capsule, Rfl: 12 .  ondansetron (ZOFRAN) 4 MG tablet, Take 4 mg by mouth every 8 (eight) hours as needed for nausea or vomiting. , Disp: , Rfl:  .  Oxycodone HCl 10 MG TABS, Take 10 mg by mouth every 6 (six) hours as needed (pain). , Disp: , Rfl: 0 .  polyethylene glycol (MIRALAX / GLYCOLAX) packet, Take 17 g by mouth daily. , Disp: , Rfl:  .  Potassium Gluconate 595 MG CAPS, Take 595 mg by mouth daily., Disp: , Rfl:  .  predniSONE (DELTASONE) 10 MG tablet, TAKE 4 TABS FOR 3 DAYS, 3 TABS FOR 3 DAYS, 2 TABS FOR 3 DAYS, 1 TAB FOR 3 DAYS, Disp: , Rfl: 2  Allergies Patient has no known allergies.  Review of Systems Review of Systems - Oncology ROS negative other than congestion and fatigue   Physical Exam  Vitals Wt Readings from Last 3 Encounters:  06/04/18 155 lb 1.6 oz (70.4 kg)  12/16/17 154 lb 12.8 oz (70.2 kg)  09/15/17 157 lb 11.2 oz (71.5 kg)   Temp Readings from Last 3 Encounters:  06/04/18 98.2 F (36.8 C) (Oral)  12/16/17 97.6 F (36.4 C) (Oral)  09/15/17 97.9 F (36.6 C)   BP Readings from Last 3 Encounters:  06/04/18 (!) 123/53  12/16/17 139/84  09/15/17 135/88   Pulse Readings from Last 3 Encounters:  06/04/18 95  12/16/17 (!) 117  09/15/17 (!) 115   Constitutional: Well-developed, well-nourished, and in no distress.   HENT: Head: Normocephalic and atraumatic.  Mouth/Throat: No oropharyngeal exudate. Mucosa  moist. Eyes: Pupils are equal, round, and reactive to light. Conjunctivae are normal. No scleral icterus.  Neck: Normal range of motion. Neck supple. No JVD present.  Cardiovascular: Normal rate, regular rhythm and normal heart sounds.  Exam reveals no gallop and no friction rub.   No murmur heard. Pulmonary/Chest: Effort normal and breath sounds normal. No respiratory distress. No wheezes. Coarse BS.   Abdominal: Soft. Bowel sounds are normal. No distension. There is no tenderness. There is no guarding.  Musculoskeletal: No edema or tenderness.  Lymphadenopathy: No cervical, axillary or supraclavicular adenopathy.  Neurological: Alert and oriented to  person, place, and time. No cranial nerve deficit.  Skin: Skin is warm and dry. No rash noted. No erythema. No pallor.  Psychiatric: Affect and judgment normal.   Labs Appointment on 06/04/2018  Component Date Value Ref Range Status  . WBC 06/04/2018 12.6* 4.0 - 10.5 K/uL Final  . RBC 06/04/2018 5.16  4.22 - 5.81 MIL/uL Final  . Hemoglobin 06/04/2018 16.9  13.0 - 17.0 g/dL Final  . HCT 06/04/2018 49.5  39.0 - 52.0 % Final  . MCV 06/04/2018 95.9  78.0 - 100.0 fL Final  . MCH 06/04/2018 32.8  26.0 - 34.0 pg Final  . MCHC 06/04/2018 34.1  30.0 - 36.0 g/dL Final  . RDW 06/04/2018 15.0  11.5 - 15.5 % Final  . Platelets 06/04/2018 101* 150 - 400 K/uL Final   Comment: PLATELET COUNT CONFIRMED BY SMEAR SPECIMEN CHECKED FOR CLOTS   . Neutrophils Relative % 06/04/2018 89  % Final  . Neutro Abs 06/04/2018 11.2* 1.7 - 7.7 K/uL Final  . Lymphocytes Relative 06/04/2018 3  % Final  . Lymphs Abs 06/04/2018 0.4* 0.7 - 4.0 K/uL Final  . Monocytes Relative 06/04/2018 7  % Final  . Monocytes Absolute 06/04/2018 0.9  0.1 - 1.0 K/uL Final  . Eosinophils Relative 06/04/2018 1  % Final  . Eosinophils Absolute 06/04/2018 0.1  0.0 - 0.7 K/uL Final  . Basophils Relative 06/04/2018 0  % Final  . Basophils Absolute 06/04/2018 0.0  0.0 - 0.1 K/uL Final    Performed at [facility], [address] [place], [place] [address]  . Sodium 06/04/2018 135  135 - 145 mmol/L Final  . Potassium 06/04/2018 4.3  3.5 - 5.1 mmol/L Final  . Chloride 06/04/2018 103  98 - 111 mmol/L Final  . CO2 06/04/2018 26  22 - 32 mmol/L Final  . Glucose, Bld 06/04/2018 133* 70 - 99 mg/dL Final  . BUN 06/04/2018 27* 8 - 23 mg/dL Final  . Creatinine, Ser 06/04/2018 1.30* 0.61 - 1.24 mg/dL Final  . Calcium 06/04/2018 10.7* 8.9 - 10.3 mg/dL Final  . Total Protein 06/04/2018 6.7  6.5 - 8.1 g/dL Final  . Albumin 06/04/2018 3.7  3.5 - 5.0 g/dL Final  . AST 06/04/2018 20  15 - 41 U/L Final  . ALT 06/04/2018 30  0 - 44 U/L Final  . Alkaline Phosphatase 06/04/2018 81  38 - 126 U/L Final  . Total Bilirubin 06/04/2018 1.8* 0.3 - 1.2 mg/dL Final  . GFR calc non Af Amer 06/04/2018 54* >60 mL/min Final  . GFR calc Af Amer 06/04/2018 >60  >60 mL/min Final   Comment: (NOTE) The eGFR has been calculated using the CKD EPI equation. This calculation has not been validated in all clinical situations. eGFR's persistently <60 mL/min signify possible Chronic Kidney Disease.   Georgiann Hahn gap 06/04/2018 6  5 - 15 Final   Performed at [facility], [address], [place], [place] [address]  . TSH 06/04/2018 2.584  0.350 - 4.500 uIU/mL Final   Comment: Performed by a 3rd Generation assay with a functional sensitivity of <=0.01 uIU/mL. Performed at [facility], [address], [place], [place] [address]      Pathology Orders Placed This Encounter  Procedures  . CT SOFT TISSUE NECK W CONTRAST    Standing Status:   Future    Standing Expiration Date:   09/05/2019    Order Specific Question:   If indicated for the ordered procedure, I authorize the administration of contrast  media per Radiology protocol    Answer:   Yes    Order Specific Question:   Preferred imaging location?    Answer:   [facility]    Order Specific Question:   Radiology Contrast Protocol - do NOT remove file path     Answer:   _0 [email or web address]  . CT CHEST W CONTRAST    Standing Status:   Future    Standing Expiration Date:   06/03/2020    Order Specific Question:   If indicated for the ordered procedure, I authorize the administration of contrast media per Radiology protocol    Answer:   Yes    Order Specific Question:   Preferred imaging location?    Answer:   [facility]    Order Specific Question:   Radiology Contrast Protocol - do NOT remove file path    Answer:   _1 [email or web address]  . CBC with Differential/Platelet    Standing Status:   Future    Standing Expiration Date:   06/05/2019  . Comprehensive metabolic panel    Standing Status:   Future    Standing Expiration Date:   06/05/2019  . Lactate dehydrogenase    Standing Status:   Future    Standing Expiration Date:   06/05/2019  . TSH    Standing Status:   Future    Standing Expiration Date:   06/05/2019  . PSA    Standing Status:   Future    Standing Expiration Date:   06/05/2019       Zoila Shutter MD

## 2018-07-27 ENCOUNTER — Ambulatory Visit (INDEPENDENT_AMBULATORY_CARE_PROVIDER_SITE_OTHER): Payer: Medicare Other | Admitting: Otolaryngology

## 2018-07-27 DIAGNOSIS — D380 Neoplasm of uncertain behavior of larynx: Secondary | ICD-10-CM

## 2018-07-27 DIAGNOSIS — C329 Malignant neoplasm of larynx, unspecified: Secondary | ICD-10-CM | POA: Diagnosis not present

## 2018-07-27 DIAGNOSIS — R490 Dysphonia: Secondary | ICD-10-CM

## 2018-07-27 DIAGNOSIS — J449 Chronic obstructive pulmonary disease, unspecified: Secondary | ICD-10-CM | POA: Diagnosis not present

## 2018-07-27 DIAGNOSIS — C61 Malignant neoplasm of prostate: Secondary | ICD-10-CM | POA: Diagnosis not present

## 2018-07-27 DIAGNOSIS — M545 Low back pain: Secondary | ICD-10-CM | POA: Diagnosis not present

## 2018-07-27 DIAGNOSIS — Z23 Encounter for immunization: Secondary | ICD-10-CM | POA: Diagnosis not present

## 2018-07-29 ENCOUNTER — Other Ambulatory Visit: Payer: Self-pay

## 2018-07-29 ENCOUNTER — Encounter (HOSPITAL_BASED_OUTPATIENT_CLINIC_OR_DEPARTMENT_OTHER): Payer: Self-pay | Admitting: *Deleted

## 2018-07-29 NOTE — Progress Notes (Signed)
Patient unsure if still taking Potassium and is unaware of why he would.  Requested he bring all medications with him on the day of surgery.  No recent lab results found and may need ISTAT on day of surgery.

## 2018-08-11 ENCOUNTER — Other Ambulatory Visit: Payer: Self-pay

## 2018-08-11 ENCOUNTER — Encounter (HOSPITAL_BASED_OUTPATIENT_CLINIC_OR_DEPARTMENT_OTHER): Payer: Self-pay | Admitting: *Deleted

## 2018-08-12 ENCOUNTER — Other Ambulatory Visit: Payer: Self-pay | Admitting: Otolaryngology

## 2018-08-18 ENCOUNTER — Ambulatory Visit (HOSPITAL_BASED_OUTPATIENT_CLINIC_OR_DEPARTMENT_OTHER): Payer: Medicare Other | Admitting: Certified Registered"

## 2018-08-18 ENCOUNTER — Encounter (HOSPITAL_BASED_OUTPATIENT_CLINIC_OR_DEPARTMENT_OTHER)
Admission: RE | Disposition: A | Discharge status: Home / Self Care | Payer: Self-pay | Source: Ambulatory Visit | Attending: Otolaryngology

## 2018-08-18 ENCOUNTER — Ambulatory Visit (HOSPITAL_BASED_OUTPATIENT_CLINIC_OR_DEPARTMENT_OTHER)
Admission: RE | Admit: 2018-08-18 | Discharge: 2018-08-18 | Disposition: A | Discharge status: Home / Self Care | Payer: Medicare Other | Source: Ambulatory Visit | Attending: Otolaryngology | Admitting: Otolaryngology

## 2018-08-18 DIAGNOSIS — Z8521 Personal history of malignant neoplasm of larynx: Secondary | ICD-10-CM | POA: Insufficient documentation

## 2018-08-18 DIAGNOSIS — M1990 Unspecified osteoarthritis, unspecified site: Secondary | ICD-10-CM | POA: Diagnosis not present

## 2018-08-18 DIAGNOSIS — Z87891 Personal history of nicotine dependence: Secondary | ICD-10-CM | POA: Insufficient documentation

## 2018-08-18 DIAGNOSIS — Z923 Personal history of irradiation: Secondary | ICD-10-CM | POA: Diagnosis not present

## 2018-08-18 DIAGNOSIS — C320 Malignant neoplasm of glottis: Secondary | ICD-10-CM | POA: Diagnosis not present

## 2018-08-18 DIAGNOSIS — Z79899 Other long term (current) drug therapy: Secondary | ICD-10-CM | POA: Diagnosis not present

## 2018-08-18 DIAGNOSIS — J449 Chronic obstructive pulmonary disease, unspecified: Secondary | ICD-10-CM | POA: Diagnosis not present

## 2018-08-18 DIAGNOSIS — I252 Old myocardial infarction: Secondary | ICD-10-CM | POA: Insufficient documentation

## 2018-08-18 DIAGNOSIS — E871 Hypo-osmolality and hyponatremia: Secondary | ICD-10-CM | POA: Diagnosis not present

## 2018-08-18 DIAGNOSIS — R490 Dysphonia: Secondary | ICD-10-CM | POA: Diagnosis not present

## 2018-08-18 DIAGNOSIS — I2510 Atherosclerotic heart disease of native coronary artery without angina pectoris: Secondary | ICD-10-CM | POA: Diagnosis not present

## 2018-08-18 DIAGNOSIS — D380 Neoplasm of uncertain behavior of larynx: Secondary | ICD-10-CM | POA: Diagnosis not present

## 2018-08-18 DIAGNOSIS — J383 Other diseases of vocal cords: Secondary | ICD-10-CM | POA: Diagnosis not present

## 2018-08-18 HISTORY — PX: MICROLARYNGOSCOPY: SHX5208

## 2018-08-18 SURGERY — MICROLARYNGOSCOPY
Anesthesia: General | Site: Throat | Laterality: Left

## 2018-08-18 MED ORDER — PROPOFOL 10 MG/ML IV BOLUS
INTRAVENOUS | Status: AC
  Filled 2018-08-18: qty 40

## 2018-08-18 MED ORDER — DEXAMETHASONE SODIUM PHOSPHATE 4 MG/ML IJ SOLN
INTRAMUSCULAR | Status: DC | PRN
  Administered 2018-08-18: 10 mg via INTRAVENOUS

## 2018-08-18 MED ORDER — SCOPOLAMINE 1 MG/3DAYS TD PT72: 1.0000 | MEDICATED_PATCH | Freq: Once | TRANSDERMAL | Status: DC | PRN

## 2018-08-18 MED ORDER — LACTATED RINGERS IV SOLN
INTRAVENOUS | Status: DC
  Administered 2018-08-18: 12:00:00 via INTRAVENOUS

## 2018-08-18 MED ORDER — ONDANSETRON HCL 4 MG/2ML IJ SOLN
INTRAMUSCULAR | Status: AC
  Filled 2018-08-18: qty 2

## 2018-08-18 MED ORDER — FENTANYL CITRATE (PF) 100 MCG/2ML IJ SOLN: 25.0000 ug | INTRAMUSCULAR | Status: DC | PRN

## 2018-08-18 MED ORDER — LIDOCAINE HCL (CARDIAC) PF 100 MG/5ML IV SOSY
PREFILLED_SYRINGE | INTRAVENOUS | Status: DC | PRN
  Administered 2018-08-18: 50 mg via INTRAVENOUS

## 2018-08-18 MED ORDER — ONDANSETRON HCL 4 MG/2ML IJ SOLN
INTRAMUSCULAR | Status: DC | PRN
  Administered 2018-08-18: 4 mg via INTRAVENOUS

## 2018-08-18 MED ORDER — ROCURONIUM BROMIDE 50 MG/5ML IV SOSY
PREFILLED_SYRINGE | INTRAVENOUS | Status: AC
  Filled 2018-08-18: qty 5

## 2018-08-18 MED ORDER — LIDOCAINE 2% (20 MG/ML) 5 ML SYRINGE
INTRAMUSCULAR | Status: AC
  Filled 2018-08-18: qty 5

## 2018-08-18 MED ORDER — FENTANYL CITRATE (PF) 100 MCG/2ML IJ SOLN
50.0000 ug | INTRAMUSCULAR | Status: DC | PRN
  Administered 2018-08-18: 50 ug via INTRAVENOUS

## 2018-08-18 MED ORDER — PROPOFOL 10 MG/ML IV BOLUS
INTRAVENOUS | Status: DC | PRN
  Administered 2018-08-18: 100 mg via INTRAVENOUS
  Administered 2018-08-18: 30 mg via INTRAVENOUS

## 2018-08-18 MED ORDER — SUCCINYLCHOLINE CHLORIDE 20 MG/ML IJ SOLN
INTRAMUSCULAR | Status: DC | PRN
  Administered 2018-08-18: 100 mg via INTRAVENOUS

## 2018-08-18 MED ORDER — ONDANSETRON HCL 4 MG/2ML IJ SOLN: 4.0000 mg | Freq: Once | INTRAMUSCULAR | Status: DC | PRN

## 2018-08-18 MED ORDER — EPINEPHRINE PF 1 MG/ML IJ SOLN
INTRAMUSCULAR | Status: DC | PRN
  Administered 2018-08-18: .5 mL

## 2018-08-18 MED ORDER — DEXAMETHASONE SODIUM PHOSPHATE 10 MG/ML IJ SOLN
INTRAMUSCULAR | Status: AC
Start: 2018-08-18 — End: ?
  Filled 2018-08-18: qty 1

## 2018-08-18 MED ORDER — FENTANYL CITRATE (PF) 100 MCG/2ML IJ SOLN
INTRAMUSCULAR | Status: AC
  Filled 2018-08-18: qty 2

## 2018-08-18 MED ORDER — MIDAZOLAM HCL 2 MG/2ML IJ SOLN: 1.0000 mg | INTRAMUSCULAR | Status: DC | PRN

## 2018-08-18 SURGICAL SUPPLY — 23 items
CANISTER SUCT 1200ML W/VALVE (MISCELLANEOUS) ×3 IMPLANT
COVER WAND RF STERILE (DRAPES) IMPLANT
GAUZE SPONGE 4X4 12PLY STRL LF (GAUZE/BANDAGES/DRESSINGS) ×3 IMPLANT
GLOVE BIO SURGEON STRL SZ7.5 (GLOVE) ×3 IMPLANT
GLOVE ECLIPSE 6.5 STRL STRAW (GLOVE) ×3 IMPLANT
GOWN STRL REUS W/ TWL LRG LVL3 (GOWN DISPOSABLE) ×1 IMPLANT
GOWN STRL REUS W/TWL LRG LVL3 (GOWN DISPOSABLE) ×2
GUARD TEETH (MISCELLANEOUS) IMPLANT
MARKER SKIN DUAL TIP RULER LAB (MISCELLANEOUS) IMPLANT
NDL SAFETY ECLIPSE 18X1.5 (NEEDLE) ×1 IMPLANT
NEEDLE HYPO 18GX1.5 SHARP (NEEDLE) ×2
NEEDLE SPNL 22GX7 QUINCKE BK (NEEDLE) IMPLANT
NS IRRIG 1000ML POUR BTL (IV SOLUTION) ×3 IMPLANT
PACK BASIN DAY SURGERY FS (CUSTOM PROCEDURE TRAY) ×3 IMPLANT
PATTIES SURGICAL .5 X3 (DISPOSABLE) ×3 IMPLANT
SHEET MEDIUM DRAPE 40X70 STRL (DRAPES) ×3 IMPLANT
SLEEVE SCD COMPRESS KNEE MED (MISCELLANEOUS) ×3 IMPLANT
SOLUTION BUTLER CLEAR DIP (MISCELLANEOUS) ×3 IMPLANT
SYR CONTROL 10ML LL (SYRINGE) IMPLANT
SYR TB 1ML LL NO SAFETY (SYRINGE) ×3 IMPLANT
TOWEL GREEN STERILE FF (TOWEL DISPOSABLE) ×3 IMPLANT
TUBE CONNECTING 20'X1/4 (TUBING) ×1
TUBE CONNECTING 20X1/4 (TUBING) ×2 IMPLANT

## 2018-08-18 NOTE — Op Note (Signed)
DATE OF PROCEDURE:  08/18/2018                              OPERATIVE REPORT  SURGEON:  Leta Baptist, MD  PREOPERATIVE DIAGNOSES: 1. Hoarseness 2. Left vocal cord mass  POSTOPERATIVE DIAGNOSES: 1. Hoarseness 2. Left vocal cord mass  PROCEDURE PERFORMED: MicroDirect laryngoscopy with biopsy of the left vocal cord mass  ANESTHESIA:  General endotracheal tube anesthesia.  COMPLICATIONS:  None.  ESTIMATED BLOOD LOSS:  Minimal.  INDICATION FOR PROCEDURE:  Ivan [name] is a 72 y.o. male with a history of laryngeal squamous cell carcinoma of his vocal cords in 2017. He was treated with radiation therapy. Over the past few months, the patient has been complaining of increasing hoarseness. On examination, the patient was noted to have an ulcerating mass replacing his left anterior vocal cord. Based on the above findings, the decision was made for the patient to undergo the above-stated procedure. The risks, benefits, alternatives, and details of the procedure were discussed with the patient.  Questions were invited and answered.  Informed consent was obtained.  DESCRIPTION:  The patient was taken to the operating room and placed supine on the operating table.  General endotracheal tube anesthesia was administered by the anesthesiologist.  The patient was positioned and prepped and draped in a standard fashion for direct laryngoscopy.  A Dedo laryngoscope was used for examination. The laryngoscope was inserted via the oral cavity into the pharynx. Examination of the epiglottis, aryepiglottic folds, vallecula, and piriform sinuses were all normal. No supraglottic lesion was noted. Examination of the vocal cords showed an ulcerative mass replacing the anterior half of the left vocal cord. The mass appeared to involve the anterior commissure, extending to the anteriormost portion of the right vocal cord. Both vocal cords were previously noted to be mobile.  The Dedo laryngoscope was suspended with a  Lewy suspender. An operating microscope was brought into the field. Under the operative microscope, multiple biopsy specimens were obtained from the ulcerative mass. The specimens were sent to the pathology department for permanent histologic identification. Hemostasis of the biopsy sites were achieved with pledgets soaked with epinephrine. The Dedo laryngoscope was withdrawn.  The care of the patient was turned over to the anesthesiologist.  The patient was awakened from anesthesia without difficulty.  The patient was extubated and transferred to the recovery room in good condition.  OPERATIVE FINDINGS:  An ulcerative mass was noted on the left vocal cord,  Concerning for recurrent squamous cell carcinoma.  SPECIMEN:  Left vocal cord biopsy specimens.  FOLLOWUP CARE:  The patient will be discharged home once awake and alert. The patient will follow up in my office in approximately 1 week.  [name] 08/18/2018 12:14 PM

## 2018-08-18 NOTE — Anesthesia Preprocedure Evaluation (Addendum)
Anesthesia Evaluation  Patient identified by MRN, date of birth, ID band Patient awake    Reviewed: Allergy & Precautions, NPO status , Patient's Chart, lab work & pertinent test results  Airway Mallampati: II  TM Distance: >3 FB Neck ROM: Full    Dental  (+) Dental Advisory Given, Edentulous Upper, Poor Dentition, Missing   Pulmonary COPD, former smoker (Quit 9 days ago),    Pulmonary exam normal breath sounds clear to auscultation       Cardiovascular + CAD and + Past MI  Normal cardiovascular exam Rhythm:Regular Rate:Normal     Neuro/Psych negative neurological ROS  negative psych ROS   GI/Hepatic negative GI ROS, Neg liver ROS,   Endo/Other  negative endocrine ROS  Renal/GU Renal InsufficiencyRenal disease     Musculoskeletal  (+) Arthritis ,   Abdominal   Peds  Hematology negative hematology ROS (+)   Anesthesia Other Findings Day of surgery medications reviewed with the patient.  Left vocal cord mass  Reproductive/Obstetrics                            Anesthesia Physical Anesthesia Plan  ASA: III  Anesthesia Plan: General   Post-op Pain Management:    Induction: Intravenous  PONV Risk Score and Plan: 2 and Dexamethasone and Ondansetron  Airway Management Planned: Oral ETT  Additional Equipment:   Intra-op Plan:   Post-operative Plan: Extubation in OR  Informed Consent: I have reviewed the patients History and Physical, chart, labs and discussed the procedure including the risks, benefits and alternatives for the proposed anesthesia with the patient or authorized representative who has indicated his/her understanding and acceptance.   Dental advisory given  Plan Discussed with: CRNA  Anesthesia Plan Comments: (Risks/benefits of general anesthesia discussed with patient including risk of damage to teeth, lips, gum, and tongue, nausea/vomiting, allergic reactions to  medications, and the possibility of heart attack, stroke and death.  All patient questions answered.  Patient wishes to proceed.)        Anesthesia Quick Evaluation

## 2018-08-18 NOTE — Anesthesia Postprocedure Evaluation (Signed)
Anesthesia Post Note  Patient: Ivan [name]  Procedure(s) Performed: DIRECT MICROLARYNGOSCOPY with biopsy of left vocal cord mass (Left Throat)     Patient location during evaluation: PACU Anesthesia Type: General Level of consciousness: awake and alert, awake and oriented Pain management: pain level controlled Vital Signs Assessment: post-procedure vital signs reviewed and stable Respiratory status: spontaneous breathing, nonlabored ventilation and respiratory function stable Cardiovascular status: blood pressure returned to baseline and stable Postop Assessment: no apparent nausea or vomiting Anesthetic complications: no    Last Vitals:  Vitals:   08/18/18 1245 08/18/18 1248  BP: (!) 155/91   Pulse: 87 85  Resp: (!) 21 17  Temp:    SpO2: 100% 100%    Last Pain:  Vitals:   08/18/18 1245  TempSrc:   PainSc: 2                  Catalina Gravel

## 2018-08-18 NOTE — Transfer of Care (Signed)
Immediate Anesthesia Transfer of Care Note  Patient: Ivan [name]  Procedure(s) Performed: DIRECT MICROLARYNGOSCOPY with biopsy of left vocal cord mass (Left Throat)  Patient Location: PACU  Anesthesia Type:General  Level of Consciousness: awake, alert  and oriented  Airway & Oxygen Therapy: Patient Spontanous Breathing and Patient connected to face mask oxygen  Post-op Assessment: Report given to RN and Post -op Vital signs reviewed and stable  Post vital signs: Reviewed and stable  Last Vitals:  Vitals Value Taken Time  BP    Temp    Pulse 100 08/18/2018 12:25 PM  Resp 24 08/18/2018 12:25 PM  SpO2 100 % 08/18/2018 12:25 PM  Vitals shown include unvalidated device data.  Last Pain:  Vitals:   08/18/18 1052  TempSrc: Oral  PainSc: 6          Complications: No apparent anesthesia complications

## 2018-08-18 NOTE — Discharge Instructions (Signed)
°  Post Anesthesia Home Care Instructions  Activity: Get plenty of rest for the remainder of the day. A responsible individual must stay with you for 24 hours following the procedure.  For the next 24 hours, DO NOT: -Drive a car -Paediatric nurse -Drink alcoholic beverages -Take any medication unless instructed by your physician -Make any legal decisions or sign important papers.  Meals: Start with liquid foods such as gelatin or soup. Progress to regular foods as tolerated. Avoid greasy, spicy, heavy foods. If nausea and/or vomiting occur, drink only clear liquids until the nausea and/or vomiting subsides. Call your physician if vomiting continues.  Special Instructions/Symptoms: Your throat may feel dry or sore from the anesthesia or the breathing tube placed in your throat during surgery. If this causes discomfort, gargle with warm salt water. The discomfort should disappear within 24 hours.  If you had a scopolamine patch placed behind your ear for the management of post- operative nausea and/or vomiting:  1. The medication in the patch is effective for 72 hours, after which it should be removed.  Wrap patch in a tissue and discard in the trash. Wash hands thoroughly with soap and water. 2. You may remove the patch earlier than 72 hours if you experience unpleasant side effects which may include dry mouth, dizziness or visual disturbances. 3. Avoid touching the patch. Wash your hands with soap and water after contact with the patch.   --------------  The patient may resume all his previous activities and diet.

## 2018-08-18 NOTE — H&P (Signed)
Cc: Hoarseness, history of laryngeal squamous cell carcinoma  HPI: The patient is a 72 year old male who returns today with his son.  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  He completed his radiation therapy.  The patient has been returning on a regular basis for laryngeal surveillance.  According to the patient, he has noted increasing hoarseness over the last few months.  He is tolerating oral intake well.  He denies any significant dysphagia or odynophagia.    Exam: The nasal cavities were decongested and anesthetised with a combination of oxymetazoline and 4% lidocaine solution.  The flexible scope was inserted into the right nasal cavity and advanced towards the nasopharynx.  Visualized mucosa over the turbinates and septum were normal.  The nasopharynx was clear.  Oropharyngeal walls were symmetric and mobile without lesion, mass, or edema.  Hypopharynx was also without  lesion or edema.  No lesions or asymmetry in the supraglottic larynx.  Arytenoid mucosa was mildly edematous.  The anterior  of the left vocal cord was noted to be covered with an ulcerative mass.  No obvious mass was noted on the right side.  Base of tongue was within normal limits. The patient tolerated the procedure well.   Assessment: 1.  The anterior  of his left vocal cord is noted to be covered with an ulcerative mass.  No obvious mass is noted on the right side.  2.  Both vocal cords are still mobile.  Plan: 1.  The laryngoscopy findings are reviewed with the patient and his son.  2.  Based on the above findings, we will proceed with microdirect laryngoscopy and biopsy of the left vocal cord. The risks, benefits, and details of the procedure are reviewed with the patient.  3.  The patient would like to proceed with the procedure.

## 2018-08-19 ENCOUNTER — Other Ambulatory Visit (INDEPENDENT_AMBULATORY_CARE_PROVIDER_SITE_OTHER): Payer: Self-pay | Admitting: Otolaryngology

## 2018-08-19 ENCOUNTER — Encounter (HOSPITAL_BASED_OUTPATIENT_CLINIC_OR_DEPARTMENT_OTHER): Payer: Self-pay | Admitting: Otolaryngology

## 2018-08-19 DIAGNOSIS — C140 Malignant neoplasm of pharynx, unspecified: Secondary | ICD-10-CM

## 2018-08-24 ENCOUNTER — Encounter (HOSPITAL_COMMUNITY): Payer: Self-pay

## 2018-08-24 ENCOUNTER — Encounter (HOSPITAL_COMMUNITY)
Admission: RE | Admit: 2018-08-24 | Discharge: 2018-08-24 | Disposition: A | Discharge status: Home / Self Care | Payer: Medicare Other | Source: Ambulatory Visit | Attending: Otolaryngology | Admitting: Otolaryngology

## 2018-08-25 ENCOUNTER — Ambulatory Visit (HOSPITAL_COMMUNITY): Payer: Medicare Other | Admitting: Hematology

## 2018-09-23 ENCOUNTER — Ambulatory Visit (HOSPITAL_COMMUNITY)
Admission: RE | Admit: 2018-09-23 | Discharge: 2018-09-23 | Disposition: A | Discharge status: Home / Self Care | Payer: Medicare Other | Source: Ambulatory Visit | Attending: Otolaryngology | Admitting: Otolaryngology

## 2018-09-23 DIAGNOSIS — C140 Malignant neoplasm of pharynx, unspecified: Secondary | ICD-10-CM | POA: Diagnosis not present

## 2018-09-23 DIAGNOSIS — C320 Malignant neoplasm of glottis: Secondary | ICD-10-CM | POA: Diagnosis not present

## 2018-09-23 LAB — GLUCOSE, CAPILLARY: GLUCOSE-CAPILLARY: 122 mg/dL — AB (ref 70–99)

## 2018-09-23 MED ORDER — FLUDEOXYGLUCOSE F - 18 (FDG) INJECTION
8.0900 | Freq: Once | INTRAVENOUS | Status: AC | PRN
  Administered 2018-09-23: 8.09 via INTRAVENOUS

## 2018-10-01 ENCOUNTER — Encounter (HOSPITAL_COMMUNITY): Payer: Self-pay | Admitting: Hematology

## 2018-10-01 ENCOUNTER — Inpatient Hospital Stay (HOSPITAL_COMMUNITY): Payer: Medicare Other | Attending: Hematology | Admitting: Hematology

## 2018-10-01 ENCOUNTER — Other Ambulatory Visit: Payer: Self-pay

## 2018-10-01 DIAGNOSIS — C61 Malignant neoplasm of prostate: Secondary | ICD-10-CM | POA: Insufficient documentation

## 2018-10-01 DIAGNOSIS — Z8521 Personal history of malignant neoplasm of larynx: Secondary | ICD-10-CM | POA: Insufficient documentation

## 2018-10-01 DIAGNOSIS — F17210 Nicotine dependence, cigarettes, uncomplicated: Secondary | ICD-10-CM

## 2018-10-01 DIAGNOSIS — Z923 Personal history of irradiation: Secondary | ICD-10-CM | POA: Diagnosis not present

## 2018-10-01 DIAGNOSIS — Z905 Acquired absence of kidney: Secondary | ICD-10-CM | POA: Insufficient documentation

## 2018-10-01 DIAGNOSIS — Z79899 Other long term (current) drug therapy: Secondary | ICD-10-CM | POA: Diagnosis not present

## 2018-10-01 DIAGNOSIS — C320 Malignant neoplasm of glottis: Secondary | ICD-10-CM | POA: Insufficient documentation

## 2018-10-01 NOTE — Assessment & Plan Note (Signed)
1.  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 - He underwent definitive radiation therapy.  He was also treated for his concurrently diagnosed prostate cancer with radiation. - He recently developed hoarseness.  He was evaluated by Dr. Benjamine Mola.  Direct laryngoscopy on 08/18/2018 showed ulcerated mass on the left vocal cord.  Biopsy was consistent with squamous cell carcinoma. - PET/CT scan on 09/23/2018 shows hypermetabolism in the anterior glottis, without discrete mass.  No neck adenopathy was seen.  Mildly hypermetabolic right paratracheal and right hilar adenopathy, slightly better compared to prior PET CT scan 2018. - I have called and talked to Dr. Conley Canal at [facility].  He kindly agreed to see this patient next week and discuss surgical options. -I have also reached out to Dr.Yanagihara at [facility] and informed him of the recurrence. -I will give him an appointment in 4 weeks to discuss various options. -He had a history of nephrectomy for nonfunctioning kidney.

## 2018-10-01 NOTE — Progress Notes (Signed)
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  CURRENT THERAPY: Treated with definitive radiation in [place], [place] from 08/15/16-09/26/16 AND observation  BRIEF ONCOLOGIC HISTORY:    Squamous cell carcinoma of left vocal cord ([place])   07/17/2016 Procedure    Flexible Fiberoptic laryngoscopy, true vocal cords pale yellow and edematous with lesions along the anterior commissure and bilateral vocal cords, worse on the left    07/23/2016 Initial Diagnosis    Squamous cell carcinoma of left vocal cord ([name]), vocal cord biopsy L    08/05/2016 Imaging    CT neck- Irregular enhancement centered within the anterior commissure of the glottis consistent with history of squamous cell carcinoma. There is probable supraglottic extension along the anterior false cords and suspected laryngeal cartilage erosion anteriorly at the level of of the commissure. No extension beyond the larynx, extension into the subglottic airway, or lymphadenopathy is identified.    08/06/2016 Imaging    CT chest- 1. Asymmetric interstitial changes involving the right lobe suggestive of interstitial lung disease. 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 A followup high-resolution CT of the chest is recommended in 6 months to assess for temporal change and provide a more detailed assessment of interstitial changes. 2. Enlarged mediastinal and right hilar lymph nodes. Nodes measure up to 1.5 cm which is considered pathologically enlarged by size criteria. In the setting of interstitial lung disease this is a nonspecific finding and may be reactive. 3. Aortic atherosclerosis, cardiac enlargement and multi vessel coronary artery calcification 4.  Small pulmonary nodules are identified in the right lung. The largest measures 6 mm. Attention at follow-up imaging advised.    08/15/2016 - 09/26/2016 Radiation Therapy    [name], [place]    12/27/2016 Imaging    CT chest high resolution- 1. Stable scattered pulmonary nodules, largest 6 mm, for which 4 month stability has been demonstrated. Recommend continued attention on follow-up chest CT in 6 months. 2. Interval stability of interstitial lung disease asymmetrically involving the right lung characterized by patchy subpleural reticulation and ground-glass attenuation with associated mild traction bronchiolectasis, no frank honeycombing and no clear basilar gradient. Findings are not appreciably changed at the lung bases compared to a 2015 CT abdomen study. 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 Follow-up high-resolution chest CT in 12 months recommended to ensure continued temporal pattern stability. 3. Stable mild mediastinal lymphadenopathy, most compatible with benign reactive adenopathy. 4. Aortic atherosclerosis. Left main and 3 vessel coronary atherosclerosis.    03/10/2017 PET scan    1. Accentuated activity at the glottic level posteriorly in the midline, maximum SUV 13.4, without well-defined CT correlate. Lesser activity along the arytenoid cartilage region, left greater than right. The patient's prior cancer was centered somewhat anteriorly along the anterior commissure, in this region does not appear differentially hypermetabolic on today's exam compared to the rest of the glottic region. Accordingly, the activity present today may simply be physiologic but forms a reasonable baseline for future comparisons. No adenopathy in the neck. 2. Mildly enlarged right paratracheal lymph node is mildly hypermetabolic with a maximum SUV of 5.4 (compared to background mediastinal blood pool activity level of 2.9). 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 Surveillance is likely warranted. There is some activity slightly above background in the right hilum. 3. No hypermetabolic lung nodules are currently visible, including the 7 mm average size right lower lobe nodule adjacent to the major fissure. This is right at the borderline of sensitive PET-CT size thresholds. 4. 3.7 cm infrarenal abdominal aortic aneurysm. Recommend followup by [place] in 2 years. This recommendation follows ACR consensus guidelines: White Paper of the ACR Incidental Findings Committee II on Vascular Findings. J Am Coll Radiol 2013; 10:789-794. 5. Vaguely accentuated activity in the left side of the prostate gland. No pelvic adenopathy is observed. No hypermetabolic bony lesions. 6.  Aortic Atherosclerosis (ICD10-I70.0).  Coronary atherosclerosis. 7. Left nephrectomy.    06/12/2017 Imaging    CT Chest with contrast: IMPRESSION: 1. No definitive change in pulmonary nodules with the largest measuring 6 mm, taking difference in slice selection into account. Recommend continued attention on follow-up in 6 months. 2. Interstitial lung disease, unchanged in the interval. 3. A few mildly enlarged mediastinal nodes as above are nonspecific but may be reactive. 4. Aortic atherosclerosis, cardiac enlargement, and multi vessel coronary artery disease.  Aortic Atherosclerosis (ICD10-I70.0).   Electronically Signed   By: Dorise Bullion III M.D   On: 06/12/2017 15:27    06/12/2017 Imaging    CT soft tissue neck: IMPRESSION: 1. Decreased enhancement and mass effect anterior commissure region of larynx. No new exophytic lesion or locally invasive mass to suggest recurrent/residual disease. No cervical lymphadenopathy by imaging criteria. 2. Bilateral carotid bifurcation atherosclerotic disease,  right greater than left. Given radiation treatment to the neck, consider follow-up with carotid Doppler to ensure continued patency.   Electronically Signed   By: Kristine Garbe M.D.   On: 06/12/2017 17:01     Prostate cancer ([place])   08/11/2016 Initial Diagnosis    Prostate cancer ([place])    09/23/2016 - 11/22/2016 Radiation Therapy    [name], [place]      CANCER STAGING: Cancer Staging Squamous cell carcinoma of left vocal cord ([name]) Staging form: Larynx - Glottis, AJCC 7th Edition - Clinical stage from 08/06/2016: Stage II (T2, N0, M0) - Signed by Baird Cancer, PA-C on 08/06/2016    INTERVAL HISTORY:  Mr. Mcconaghy 72 y.o. male returns for routine follow-up for squamous cell carcinoma of the left vocal cord and prostate cancer. He is here today with his family. He is horse and unable to speak normally. He is unable to eat normally and he is becoming weak. He is mostly in a wheelchair because he can not walk due to weakness and back pain. He denies any nausea, vomiting, or diarrhea. Denies any new pains. Denies any SOB or new cough. He reports his appetite and energy level is zero. He lives with his wife and he doesn't do a lot of activities during the day. He is able to move from room to room in his house but if he has to walk any longer distance he needs a wheelchair.     REVIEW OF SYSTEMS:  Review of Systems  HENT:   Positive for voice change.   Psychiatric/Behavioral: Positive for sleep disturbance.  All other systems reviewed and are negative.    PAST MEDICAL/SURGICAL HISTORY:  Past Medical History:  Diagnosis Date  . Arthritis   . Chronic back pain   . COPD (chronic obstructive pulmonary disease) ([place])   . Hypercalcemia   . Kidney stones   . Polycythemia, secondary  08/11/2016  . Prostate cancer ([name]) 08/11/2016  . Sepsis ([place])   . Squamous cell carcinoma of left vocal cord ([place]) 07/25/2016  . UTI (lower urinary tract infection)    Past Surgical  History:  Procedure Laterality Date  . APPENDECTOMY    . BACK SURGERY     10/2013  . GOLD SEED IMPLANT N/A 08/21/2016   Procedure: GOLD SEED IMPLANT;  Surgeon: Cleon Gustin, MD;  Location: [facility];  Service: Urology;  Laterality: N/A;  . HERNIA REPAIR    . MICROLARYNGOSCOPY Bilateral 07/23/2016   Procedure: MICRO DIRECT LARYNGOSCOPY WITH BIOPSY;  Surgeon: Leta Baptist, MD;  Location: [facility];  Service: ENT;  Laterality: Bilateral;  . MICROLARYNGOSCOPY Left 08/18/2018   Procedure: DIRECT MICROLARYNGOSCOPY with biopsy of left vocal cord mass;  Surgeon: Leta Baptist, MD;  Location: [facility];  Service: ENT;  Laterality: Left;  . NEPHRECTOMY Left    04/2014  . PROSTATE BIOPSY N/A 06/05/2016   Procedure: PROSTATE BIOPSY;  Surgeon: Cleon Gustin, MD;  Location: [facility];  Service: Urology;  Laterality: N/A;  . TRANSRECTAL ULTRASOUND N/A 08/21/2016   Procedure: TRANSRECTAL ULTRASOUND;  Surgeon: Cleon Gustin, MD;  Location: [facility];  Service: Urology;  Laterality: N/A;  . vocal cord biopsy  06/2016     SOCIAL HISTORY:  Social History   Socioeconomic History  . Marital status: Married    Spouse name: Not on file  . Number of children: Not on file  . Years of education: Not on file  . Highest education level: Not on file  Occupational History  . Not on file  Social Needs  . Financial resource strain: Not on file  . Food insecurity:    Worry: Not on file    Inability: Not on file  . Transportation needs:    Medical: Not on file    Non-medical: Not on file  Tobacco Use  . Smoking status: Current Every Day Smoker    Packs/day: 1.00    Years: 50.00    Pack years: 50.00    Types: Cigarettes    Last attempt to quit: 08/24/2017    Years since quitting: 1.1  . Smokeless tobacco: Never Used  Substance and Sexual Activity  . Alcohol use: No  . Drug use: No  . Sexual activity: Yes  Lifestyle  . Physical activity:    Days per week: Not on file     Minutes per session: Not on file  . Stress: Not on file  Relationships  . Social connections:    Talks on phone: Not on file    Gets together: Not on file    Attends religious service: Not on file    Active member of club or organization: Not on file    Attends meetings of clubs or organizations: Not on file    Relationship status: Not on file  . Intimate partner violence:    Fear of current or ex partner: Not on file    Emotionally abused: Not on file    Physically abused: Not on file    Forced sexual activity: Not on file  Other Topics Concern  . Not on file  Social History Narrative  . Not on file    FAMILY HISTORY:  Family History  Problem Relation Age of Onset  . Cancer Mother   . Cancer Brother     CURRENT MEDICATIONS:  Outpatient Encounter Medications as of 10/01/2018  Medication Sig Note  . acetaminophen (TYLENOL)  500 MG tablet Take 500 mg by mouth every 6 (six) hours as needed (for pain.).   Marland Kitchen atorvastatin (LIPITOR) 20 MG tablet Take 20 mg by mouth daily.   . Melatonin 5 MG TABS Take 5 mg by mouth at bedtime as needed (for sleep).   . nitrofurantoin, macrocrystal-monohydrate, (MACROBID) 100 MG capsule Take 1 capsule (100 mg total) by mouth 2 (two) times daily. (Patient taking differently: Take 100 mg by mouth daily. )   . ondansetron (ZOFRAN) 4 MG tablet Take 4 mg by mouth every 8 (eight) hours as needed for nausea or vomiting.  11/04/2016: Received from: External Pharmacy  . Oxycodone HCl 10 MG TABS Take 10 mg by mouth every 6 (six) hours as needed (pain).    . polyethylene glycol (MIRALAX / GLYCOLAX) packet Take 17 g by mouth daily.    . Potassium 75 MG TABS Take 99 mg by mouth.   . [DISCONTINUED] ALPRAZolam (XANAX) 1 MG tablet Take 1 mg by mouth at bedtime.    . [DISCONTINUED] Multiple Vitamin (MULTIVITAMIN WITH MINERALS) TABS tablet Take 1 tablet by mouth daily.   . [DISCONTINUED] potassium chloride (K-DUR,KLOR-CON) 10 MEQ tablet Take 10 mEq by mouth 2 (two) times  daily.   . [DISCONTINUED] potassium phosphate, monobasic, (K-PHOS ORIGINAL) 500 MG tablet Take 500 mg by mouth 4 (four) times daily -  with meals and at bedtime.    No facility-administered encounter medications on file as of 10/01/2018.     ALLERGIES:  No Known Allergies   PHYSICAL EXAM:  ECOG Performance status: 1  Vitals:   10/01/18 0927  BP: (!) 158/71  Pulse: 74  Resp: 18  Temp: 98.1 F (36.7 C)  SpO2: 100%   Filed Weights    Physical Exam  Constitutional: He is oriented to person, place, and time. He appears well-developed and well-nourished.  Musculoskeletal:  Wheelchair for mobility   Neurological: He is alert and oriented to person, place, and time.  Skin: Skin is warm and dry.  Psychiatric: He has a normal mood and affect. His behavior is normal. Judgment and thought content normal.     LABORATORY DATA:  I have reviewed the labs as listed.  CBC    Component Value Date/Time   WBC 12.6 (H) 06/04/2018 0927   RBC 5.16 06/04/2018 0927   HGB 16.9 06/04/2018 0927   HCT 49.5 06/04/2018 0927   PLT 101 (L) 06/04/2018 0927   MCV 95.9 06/04/2018 0927   MCH 32.8 06/04/2018 0927   MCHC 34.1 06/04/2018 0927   RDW 15.0 06/04/2018 0927   LYMPHSABS 0.4 (L) 06/04/2018 0927   MONOABS 0.9 06/04/2018 0927   EOSABS 0.1 06/04/2018 0927   BASOSABS 0.0 06/04/2018 0927   CMP Latest Ref Rng & Units 06/04/2018 03/25/2018 12/10/2017  Glucose 70 - 99 mg/dL 133(H) 137(H) 134(H)  BUN 8 - 23 mg/dL 27(H) 25(H) 23(H)  Creatinine 0.61 - 1.24 mg/dL 1.30(H) 1.06 1.13  Sodium 135 - 145 mmol/L 135 138 137  Potassium 3.5 - 5.1 mmol/L 4.3 4.5 4.4  Chloride 98 - 111 mmol/L 103 107 104  CO2 22 - 32 mmol/L 26 21(L) 22  Calcium 8.9 - 10.3 mg/dL 10.7(H) 10.7(H) 10.8(H)  Total Protein 6.5 - 8.1 g/dL 6.7 7.2 7.2  Total Bilirubin 0.3 - 1.2 mg/dL 1.8(H) 1.3(H) 0.6  Alkaline Phos 38 - 126 U/L 81 98 126  AST 15 - 41 U/L 20 25 39  ALT 0 - 44 U/L 30 24 36  DIAGNOSTIC IMAGING:  I have  independently reviewed the scans and discussed with the patient.  I have reviewed Francene Finders, NP's note and agree with the documentation.  I personally performed a face-to-face visit, made revisions and my assessment and plan is as follows.    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 - He underwent definitive radiation therapy.  He was also treated for his concurrently diagnosed prostate cancer with radiation. - He recently developed hoarseness.  He was evaluated by Dr. Benjamine Mola.  Direct laryngoscopy on 08/18/2018 showed ulcerated mass on the left vocal cord.  Biopsy was consistent with squamous cell carcinoma. - PET/CT scan on 09/23/2018 shows hypermetabolism in the anterior glottis, without discrete mass.  No neck adenopathy was seen.  Mildly hypermetabolic right paratracheal and right hilar adenopathy, slightly better compared to prior PET CT scan 2018. - I have called and talked to Dr. Conley Canal at [facility].  He kindly agreed to see this patient next week and discuss surgical options. -I have also reached out to Dr.Yanagihara at [facility] and informed him of the recurrence. -I will give him an appointment in 4 weeks to discuss various options. -He had a history of nephrectomy for nonfunctioning kidney.   Total time spent is 40 minutes with more than 50% of the time spent face-to-face discussing scan results, biopsy results, treatment options and coordination of care.  Orders placed this encounter:  No orders of the defined types were placed in this encounter.     Derek Jack, MD [place] [phone]

## 2018-10-01 NOTE — Patient Instructions (Signed)
[facility] at [facility] Discharge Instructions     Thank you for choosing [facility] at [facility] to provide your oncology and hematology care.  To afford each patient quality time with our provider, please arrive at least 15 minutes before your scheduled appointment time.   If you have a lab appointment with the Cancer Center please come in thru the  Main Entrance and check in at the main information desk  You need to re-schedule your appointment should you arrive 10 or more minutes late.  We strive to give you quality time with our providers, and arriving late affects you and other patients whose appointments are after yours.  Also, if you no show three or more times for appointments you may be dismissed from the clinic at the providers discretion.     Again, thank you for choosing [facility].  Our hope is that these requests will decrease the amount of time that you wait before being seen by our physicians.       _____________________________________________________________  Should you have questions after your visit to [facility], please contact our office at [phone] between the hours of 8:00 a.m. and 4:30 p.m.  Voicemails left after 4:00 p.m. will not be returned until the following business day.  For prescription refill requests, have your pharmacy contact our office and allow 72 hours.    Cancer Center Support Programs:   > Cancer Support Group  2nd Tuesday of the month 1pm-2pm, Journey Room    

## 2018-10-07 DIAGNOSIS — Z923 Personal history of irradiation: Secondary | ICD-10-CM | POA: Diagnosis not present

## 2018-10-07 DIAGNOSIS — C320 Malignant neoplasm of glottis: Secondary | ICD-10-CM | POA: Diagnosis not present

## 2018-10-07 DIAGNOSIS — C329 Malignant neoplasm of larynx, unspecified: Secondary | ICD-10-CM | POA: Diagnosis not present

## 2018-10-07 DIAGNOSIS — R490 Dysphonia: Secondary | ICD-10-CM | POA: Diagnosis not present

## 2018-10-07 DIAGNOSIS — F17200 Nicotine dependence, unspecified, uncomplicated: Secondary | ICD-10-CM | POA: Diagnosis not present

## 2018-10-16 ENCOUNTER — Telehealth (HOSPITAL_COMMUNITY): Payer: Self-pay

## 2018-10-16 NOTE — Telephone Encounter (Signed)
Opened in error

## 2018-10-29 ENCOUNTER — Inpatient Hospital Stay (HOSPITAL_COMMUNITY): Payer: Medicare Other | Attending: Hematology | Admitting: Hematology

## 2018-10-29 ENCOUNTER — Other Ambulatory Visit: Payer: Self-pay

## 2018-10-29 ENCOUNTER — Encounter (HOSPITAL_COMMUNITY): Payer: Self-pay | Admitting: Hematology

## 2018-10-29 VITALS — BP 129/80 | HR 112 | Temp 97.6°F | Resp 18 | Wt 149.4 lb

## 2018-10-29 DIAGNOSIS — Z923 Personal history of irradiation: Secondary | ICD-10-CM | POA: Insufficient documentation

## 2018-10-29 DIAGNOSIS — C320 Malignant neoplasm of glottis: Secondary | ICD-10-CM | POA: Diagnosis not present

## 2018-10-29 DIAGNOSIS — Z79899 Other long term (current) drug therapy: Secondary | ICD-10-CM | POA: Insufficient documentation

## 2018-10-29 DIAGNOSIS — Z8546 Personal history of malignant neoplasm of prostate: Secondary | ICD-10-CM | POA: Diagnosis not present

## 2018-10-29 DIAGNOSIS — F17210 Nicotine dependence, cigarettes, uncomplicated: Secondary | ICD-10-CM | POA: Insufficient documentation

## 2018-10-29 NOTE — Assessment & Plan Note (Signed)
1.  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 - He underwent definitive radiation therapy.  He was also treated for his concurrently diagnosed prostate cancer with radiation. - He recently developed hoarseness.  He was evaluated by Dr. Benjamine Mola.  Direct laryngoscopy on 08/18/2018 showed ulcerated mass on the left vocal cord.  Biopsy was consistent with squamous cell carcinoma. - PET/CT scan on 09/23/2018 shows hypermetabolism in the anterior glottis, without discrete mass.  No neck adenopathy was seen.  Mildly hypermetabolic right paratracheal and right hilar adenopathy, slightly better compared to prior PET CT scan 2018. - He was evaluated by Dr. Conley Canal on 10/07/2018.  A total laryngectomy was recommended given the extent of tumor and his health status. - I have reinforced to the patient that this is the optimal way to treat his cancer.  He was concerned about healing after surgery. -Finally he agreed for surgery.  His daughter will call Dr. Jenne Campus office to arrange it. - We will cancel his CT scans which were scheduled next month.  I will see him back in 4 to 6 months for follow-up.  2.  Prostate cancer: -He was treated with radiation therapy completed in January 2018. -Last PSA in August 2019 was undetectable. -We will repeat another PSA in 4 months.

## 2018-10-29 NOTE — Patient Instructions (Signed)
[facility] at [facility] Discharge Instructions     Thank you for choosing  [facility] Center at [facility] to provide your oncology and hematology care.  To afford each patient quality time with our provider, please arrive at least 15 minutes before your scheduled appointment time.   If you have a lab appointment with the Cancer Center please come in thru the  Main Entrance and check in at the main information desk  You need to re-schedule your appointment should you arrive 10 or more minutes late.  We strive to give you quality time with our providers, and arriving late affects you and other patients whose appointments are after yours.  Also, if you no show three or more times for appointments you may be dismissed from the clinic at the providers discretion.     Again, thank you for choosing [facility].  Our hope is that these requests will decrease the amount of time that you wait before being seen by our physicians.       _____________________________________________________________  Should you have questions after your visit to [facility], please contact our office at [phone] between the hours of 8:00 a.m. and 4:30 p.m.  Voicemails left after 4:00 p.m. will not be returned until the following business day.  For prescription refill requests, have your pharmacy contact our office and allow 72 hours.    Cancer Center Support Programs:   > Cancer Support Group  2nd Tuesday of the month 1pm-2pm, Journey Room    

## 2018-10-29 NOTE — Progress Notes (Signed)
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  CURRENT THERAPY: Treated with definitive radiation in [place], [place] from 08/15/16-09/26/16 now scheduling surgery AND observation  BRIEF ONCOLOGIC HISTORY:    Squamous cell carcinoma of left vocal cord ([place])   07/17/2016 Procedure    Flexible Fiberoptic laryngoscopy, true vocal cords pale yellow and edematous with lesions along the anterior commissure and bilateral vocal cords, worse on the left    07/23/2016 Initial Diagnosis    Squamous cell carcinoma of left vocal cord ([name]), vocal cord biopsy L    08/05/2016 Imaging    CT neck- Irregular enhancement centered within the anterior commissure of the glottis consistent with history of squamous cell carcinoma. There is probable supraglottic extension along the anterior false cords and suspected laryngeal cartilage erosion anteriorly at the level of of the commissure. No extension beyond the larynx, extension into the subglottic airway, or lymphadenopathy is identified.    08/06/2016 Imaging    CT chest- 1. Asymmetric interstitial changes involving the right lobe suggestive of interstitial lung disease. 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 A followup high-resolution CT of the chest is recommended in 6 months to assess for temporal change and provide a more detailed assessment of interstitial changes. 2. Enlarged mediastinal and right hilar lymph nodes. Nodes measure up to 1.5 cm which is considered pathologically enlarged by size criteria. In the setting of interstitial lung disease this is a nonspecific finding and may be reactive. 3. Aortic atherosclerosis, cardiac enlargement and multi vessel coronary  artery calcification 4. Small pulmonary nodules are identified in the right lung. The largest measures 6 mm. Attention at follow-up imaging advised.    08/15/2016 - 09/26/2016 Radiation Therapy    [name]    12/27/2016 Imaging    CT chest high resolution- 1. Stable scattered pulmonary nodules, largest 6 mm, for which 4 month stability has been demonstrated. Recommend continued attention on follow-up chest CT in 6 months. 2. Interval stability of interstitial lung disease asymmetrically involving the right lung characterized by patchy subpleural reticulation and ground-glass attenuation with associated mild traction bronchiolectasis, no frank honeycombing and no clear basilar gradient. Findings are not appreciably changed at the lung bases compared to a 2015 CT abdomen study. 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 Follow-up high-resolution chest CT in 12 months recommended to ensure continued temporal pattern stability. 3. Stable mild mediastinal lymphadenopathy, most compatible with benign reactive adenopathy. 4. Aortic atherosclerosis. Left main and 3 vessel coronary atherosclerosis.    03/10/2017 PET scan    1. Accentuated activity at the glottic level posteriorly in the midline, maximum SUV 13.4, without well-defined CT correlate. Lesser activity along the arytenoid cartilage region, left greater than right. The patient's prior cancer was centered somewhat anteriorly along the anterior commissure, in this region does not appear differentially hypermetabolic on today's exam compared to the rest of the glottic region. Accordingly, the activity present today may simply be physiologic but forms a reasonable baseline for future comparisons. No adenopathy in the neck. 2. Mildly enlarged right paratracheal lymph node is mildly hypermetabolic with a maximum SUV of 5.4 (compared to background mediastinal blood pool activity level of 2.9).  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 Surveillance is likely warranted. There is some activity slightly above background in the right hilum. 3. No hypermetabolic lung nodules are currently visible, including the 7 mm average size right lower lobe nodule adjacent to the major fissure. This is right at the borderline of sensitive PET-CT size thresholds. 4. 3.7 cm infrarenal abdominal aortic aneurysm. Recommend followup by [place] in 2 years. This recommendation follows ACR consensus guidelines: White Paper of the ACR Incidental Findings Committee II on Vascular Findings. J Am Coll Radiol 2013; 10:789-794. 5. Vaguely accentuated activity in the left side of the prostate gland. No pelvic adenopathy is observed. No hypermetabolic bony lesions. 6.  Aortic Atherosclerosis (ICD10-I70.0).  Coronary atherosclerosis. 7. Left nephrectomy.    06/12/2017 Imaging    CT Chest with contrast: IMPRESSION: 1. No definitive change in pulmonary nodules with the largest measuring 6 mm, taking difference in slice selection into account. Recommend continued attention on follow-up in 6 months. 2. Interstitial lung disease, unchanged in the interval. 3. A few mildly enlarged mediastinal nodes as above are nonspecific but may be reactive. 4. Aortic atherosclerosis, cardiac enlargement, and multi vessel coronary artery disease.  Aortic Atherosclerosis (ICD10-I70.0).   Electronically Signed   By: Dorise Bullion III M.D   On: 06/12/2017 15:27    06/12/2017 Imaging    CT soft tissue neck: IMPRESSION: 1. Decreased enhancement and mass effect anterior commissure region of larynx. No new exophytic lesion or locally invasive mass to suggest recurrent/residual disease. No cervical lymphadenopathy by imaging criteria. 2. Bilateral carotid bifurcation  atherosclerotic disease, right greater than left. Given radiation treatment to the neck, consider follow-up with carotid Doppler to ensure continued patency.   Electronically Signed   By: Kristine Garbe M.D.   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 He reports doing well beside issues with constipation. Denies any nausea, vomiting, or diarrhea. Denies any new pains. Had not noticed any recent bleeding such as epistaxis, hematuria or hematochezia. Denies recent chest pain on exertion, shortness of breath on minimal exertion, pre-syncopal episodes, or palpitations. Denies any numbness or tingling in hands or feet. Denies any recent fevers, infections, or recent hospitalizations. He reports his appetite at 75% and energy level at 0%.     REVIEW OF SYSTEMS:  Review of Systems  Gastrointestinal: Positive for constipation.  All other systems reviewed and are negative.    PAST MEDICAL/SURGICAL HISTORY:  Past Medical History:  Diagnosis Date  . Arthritis   . Chronic back pain   . COPD (chronic obstructive pulmonary disease) ([place])   . Hypercalcemia   . Kidney stones   . 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 Prostate cancer ([place]) 08/11/2016  . Sepsis ([place])   . 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 UTI (lower urinary tract infection)    Past Surgical History:  Procedure Laterality Date  . APPENDECTOMY    .  BACK SURGERY     10/2013  . GOLD SEED  IMPLANT N/A 08/21/2016   Procedure: GOLD SEED IMPLANT;  Surgeon: Cleon Gustin, MD;  Location: [facility];  Service: Urology;  Laterality: N/A;  . HERNIA REPAIR    . MICROLARYNGOSCOPY Bilateral 07/23/2016   Procedure: MICRO DIRECT LARYNGOSCOPY WITH BIOPSY;  Surgeon: Leta Baptist, MD;  Location: [facility];  Service: ENT;  Laterality: Bilateral;  . MICROLARYNGOSCOPY Left 08/18/2018   Procedure: DIRECT MICROLARYNGOSCOPY with biopsy of left vocal cord mass;  Surgeon: Leta Baptist, MD;  Location: [facility];  Service: ENT;  Laterality: Left;  . NEPHRECTOMY Left    04/2014  . PROSTATE BIOPSY N/A 06/05/2016   Procedure: PROSTATE BIOPSY;  Surgeon: Cleon Gustin, MD;  Location: [facility];  Service: Urology;  Laterality: N/A;  . TRANSRECTAL ULTRASOUND N/A 08/21/2016   Procedure: TRANSRECTAL ULTRASOUND;  Surgeon: Cleon Gustin, MD;  Location: [facility];  Service: Urology;  Laterality: N/A;  . vocal cord biopsy  06/2016     SOCIAL HISTORY:  Social History   Socioeconomic History  . Marital status: Married    Spouse name: Not on file  . Number of children: Not on file  . Years of education: Not on file  . Highest education level: Not on file  Occupational History  . Not on file  Social Needs  . Financial resource strain: Not on file  . Food insecurity:    Worry: Not on file    Inability: Not on file  . Transportation needs:    Medical: Not on file    Non-medical: Not on file  Tobacco Use  . Smoking status: Current Every Day Smoker    Packs/day: 1.00    Years: 50.00    Pack years: 50.00    Types: Cigarettes    Last attempt to quit: 08/24/2017    Years since quitting: 1.1  . Smokeless tobacco: Never Used  Substance and Sexual Activity  . Alcohol use: No  . Drug use: No  . Sexual activity: Yes  Lifestyle  . Physical activity:    Days per week: Not on file    Minutes per session: Not on file  . Stress: Not on file  Relationships  . Social connections:     Talks on phone: Not on file    Gets together: Not on file    Attends religious service: Not on file    Active member of club or organization: Not on file    Attends meetings of clubs or organizations: Not on file    Relationship status: Not on file  . Intimate partner violence:    Fear of current or ex partner: Not on file    Emotionally abused: Not on file    Physically abused: Not on file    Forced sexual activity: Not on file  Other Topics Concern  . Not on file  Social History Narrative  . Not on file    FAMILY HISTORY:  Family History  Problem Relation Age of Onset  . Cancer Mother   . Cancer Brother     CURRENT MEDICATIONS:  Outpatient Encounter Medications as of 10/29/2018  Medication Sig Note  . acetaminophen (TYLENOL) 500 MG tablet Take 500 mg by mouth every 6 (six) hours as needed (for pain.).   Marland Kitchen alendronate (FOSAMAX) 70 MG tablet TAKE 1 TABLET BY MOUTH ONE TIME PER WEEK   . atorvastatin (LIPITOR) 20 MG tablet Take 20 mg by mouth daily.   Marland Kitchen  Melatonin 5 MG TABS Take 5 mg by mouth at bedtime as needed (for sleep).   . nitrofurantoin, macrocrystal-monohydrate, (MACROBID) 100 MG capsule Take 1 capsule (100 mg total) by mouth 2 (two) times daily. (Patient taking differently: Take 100 mg by mouth daily. )   . Oxycodone HCl 10 MG TABS Take 10 mg by mouth every 6 (six) hours as needed (pain).    . polyethylene glycol (MIRALAX / GLYCOLAX) packet Take 17 g by mouth daily.    . Potassium 75 MG TABS Take 99 mg by mouth.   . ondansetron (ZOFRAN) 4 MG tablet Take 4 mg by mouth every 8 (eight) hours as needed for nausea or vomiting.  11/04/2016: Received from: External Pharmacy   No facility-administered encounter medications on file as of 10/29/2018.     ALLERGIES:  No Known Allergies   PHYSICAL EXAM:  ECOG Performance status: 1  Vitals:   10/29/18 1500  BP: 129/80  Pulse: (!) 112  Resp: 18  Temp: 97.6 F (36.4 C)  SpO2: 97%   Filed Weights   10/29/18 1500  Weight:  149 lb 6 oz (67.8 kg)    Physical Exam Constitutional:      Appearance: Normal appearance. He is normal weight.  Cardiovascular:     Rate and Rhythm: Normal rate and regular rhythm.     Heart sounds: Normal heart sounds.  Pulmonary:     Effort: Pulmonary effort is normal.     Breath sounds: Normal breath sounds.  Musculoskeletal:     Comments: wheelchair  Skin:    General: Skin is warm and dry.  Neurological:     Mental Status: He is alert and oriented to person, place, and time. Mental status is at baseline.  Psychiatric:        Mood and Affect: Mood normal.        Behavior: Behavior normal.        Thought Content: Thought content normal.        Judgment: Judgment normal.      LABORATORY DATA:  I have reviewed the labs as listed.  CBC    Component Value Date/Time   WBC 12.6 (H) 06/04/2018 0927   RBC 5.16 06/04/2018 0927   HGB 16.9 06/04/2018 0927   HCT 49.5 06/04/2018 0927   PLT 101 (L) 06/04/2018 0927   MCV 95.9 06/04/2018 0927   MCH 32.8 06/04/2018 0927   MCHC 34.1 06/04/2018 0927   RDW 15.0 06/04/2018 0927   LYMPHSABS 0.4 (L) 06/04/2018 0927   MONOABS 0.9 06/04/2018 0927   EOSABS 0.1 06/04/2018 0927   BASOSABS 0.0 06/04/2018 0927   CMP Latest Ref Rng & Units 06/04/2018 03/25/2018 12/10/2017  Glucose 70 - 99 mg/dL 133(H) 137(H) 134(H)  BUN 8 - 23 mg/dL 27(H) 25(H) 23(H)  Creatinine 0.61 - 1.24 mg/dL 1.30(H) 1.06 1.13  Sodium 135 - 145 mmol/L 135 138 137  Potassium 3.5 - 5.1 mmol/L 4.3 4.5 4.4  Chloride 98 - 111 mmol/L 103 107 104  CO2 22 - 32 mmol/L 26 21(L) 22  Calcium 8.9 - 10.3 mg/dL 10.7(H) 10.7(H) 10.8(H)  Total Protein 6.5 - 8.1 g/dL 6.7 7.2 7.2  Total Bilirubin 0.3 - 1.2 mg/dL 1.8(H) 1.3(H) 0.6  Alkaline Phos 38 - 126 U/L 81 98 126  AST 15 - 41 U/L 20 25 39  ALT 0 - 44 U/L 30 24 36       DIAGNOSTIC IMAGING:  I have independently reviewed the scans and discussed with the patient.  I have reviewed Francene Finders, NP's note and agree with the  documentation.  I personally performed a face-to-face visit, made revisions and my assessment and plan is as follows.    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 - He underwent definitive radiation therapy.  He was also treated for his concurrently diagnosed prostate cancer with radiation. - He recently developed hoarseness.  He was evaluated by Dr. Benjamine Mola.  Direct laryngoscopy on 08/18/2018 showed ulcerated mass on the left vocal cord.  Biopsy was consistent with squamous cell carcinoma. - PET/CT scan on 09/23/2018 shows hypermetabolism in the anterior glottis, without discrete mass.  No neck adenopathy was seen.  Mildly hypermetabolic right paratracheal and right hilar adenopathy, slightly better compared to prior PET CT scan 2018. - He was evaluated by Dr. Conley Canal on 10/07/2018.  A total laryngectomy was recommended given the extent of tumor and his health status. - I have reinforced to the patient that this is the optimal way to treat his cancer.  He was concerned about healing after surgery. -Finally he agreed for surgery.  His daughter will call Dr. Jenne Campus office to arrange it. - We will cancel his CT scans which were scheduled next month.  I will see him back in 4 to 6 months for follow-up.  2.  Prostate cancer: -He was treated with radiation therapy completed in January 2018. -Last PSA in August 2019 was undetectable. -We will repeat another PSA in 4 months.   Total time spent is 25 minutes with more than 50% of the time spent face-to-face discussing his treatment options and coordination of care.  Orders placed this encounter:  Orders Placed This Encounter  Procedures  . PSA  . TSH  . CBC with Differential/Platelet  .  Comprehensive metabolic panel      Derek Jack, MD [place] [phone]

## 2018-11-12 DIAGNOSIS — M810 Age-related osteoporosis without current pathological fracture: Secondary | ICD-10-CM | POA: Diagnosis not present

## 2018-11-12 DIAGNOSIS — F4322 Adjustment disorder with anxiety: Secondary | ICD-10-CM | POA: Diagnosis not present

## 2018-11-12 DIAGNOSIS — E785 Hyperlipidemia, unspecified: Secondary | ICD-10-CM | POA: Diagnosis not present

## 2018-11-12 DIAGNOSIS — D696 Thrombocytopenia, unspecified: Secondary | ICD-10-CM | POA: Diagnosis not present

## 2018-11-12 DIAGNOSIS — N189 Chronic kidney disease, unspecified: Secondary | ICD-10-CM | POA: Diagnosis not present

## 2018-11-12 DIAGNOSIS — F329 Major depressive disorder, single episode, unspecified: Secondary | ICD-10-CM | POA: Diagnosis not present

## 2018-11-12 DIAGNOSIS — R490 Dysphonia: Secondary | ICD-10-CM | POA: Diagnosis not present

## 2018-11-12 DIAGNOSIS — Z87891 Personal history of nicotine dependence: Secondary | ICD-10-CM | POA: Diagnosis not present

## 2018-11-12 DIAGNOSIS — Z923 Personal history of irradiation: Secondary | ICD-10-CM | POA: Diagnosis not present

## 2018-11-12 DIAGNOSIS — C329 Malignant neoplasm of larynx, unspecified: Secondary | ICD-10-CM | POA: Diagnosis not present

## 2018-11-12 DIAGNOSIS — K219 Gastro-esophageal reflux disease without esophagitis: Secondary | ICD-10-CM | POA: Diagnosis not present

## 2018-11-12 DIAGNOSIS — G8929 Other chronic pain: Secondary | ICD-10-CM | POA: Diagnosis not present

## 2018-11-19 DIAGNOSIS — Z87891 Personal history of nicotine dependence: Secondary | ICD-10-CM | POA: Diagnosis not present

## 2018-11-19 DIAGNOSIS — C328 Malignant neoplasm of overlapping sites of larynx: Secondary | ICD-10-CM | POA: Diagnosis not present

## 2018-11-19 DIAGNOSIS — F4322 Adjustment disorder with anxiety: Secondary | ICD-10-CM | POA: Diagnosis present

## 2018-11-19 DIAGNOSIS — D351 Benign neoplasm of parathyroid gland: Secondary | ICD-10-CM | POA: Diagnosis not present

## 2018-11-19 DIAGNOSIS — G8929 Other chronic pain: Secondary | ICD-10-CM | POA: Diagnosis present

## 2018-11-19 DIAGNOSIS — R9431 Abnormal electrocardiogram [ECG] [EKG]: Secondary | ICD-10-CM | POA: Diagnosis not present

## 2018-11-19 DIAGNOSIS — C329 Malignant neoplasm of larynx, unspecified: Secondary | ICD-10-CM | POA: Diagnosis present

## 2018-11-19 DIAGNOSIS — E785 Hyperlipidemia, unspecified: Secondary | ICD-10-CM | POA: Diagnosis present

## 2018-11-19 DIAGNOSIS — D696 Thrombocytopenia, unspecified: Secondary | ICD-10-CM | POA: Diagnosis present

## 2018-11-19 DIAGNOSIS — M549 Dorsalgia, unspecified: Secondary | ICD-10-CM | POA: Diagnosis not present

## 2018-11-19 DIAGNOSIS — Z431 Encounter for attention to gastrostomy: Secondary | ICD-10-CM | POA: Diagnosis not present

## 2018-11-19 DIAGNOSIS — N189 Chronic kidney disease, unspecified: Secondary | ICD-10-CM | POA: Diagnosis present

## 2018-11-19 DIAGNOSIS — K219 Gastro-esophageal reflux disease without esophagitis: Secondary | ICD-10-CM | POA: Diagnosis present

## 2018-11-19 DIAGNOSIS — Z483 Aftercare following surgery for neoplasm: Secondary | ICD-10-CM | POA: Diagnosis not present

## 2018-11-19 DIAGNOSIS — Z4659 Encounter for fitting and adjustment of other gastrointestinal appliance and device: Secondary | ICD-10-CM | POA: Diagnosis not present

## 2018-11-19 DIAGNOSIS — M810 Age-related osteoporosis without current pathological fracture: Secondary | ICD-10-CM | POA: Diagnosis present

## 2018-11-19 DIAGNOSIS — F329 Major depressive disorder, single episode, unspecified: Secondary | ICD-10-CM | POA: Diagnosis present

## 2018-11-19 DIAGNOSIS — Z923 Personal history of irradiation: Secondary | ICD-10-CM | POA: Diagnosis not present

## 2018-11-19 DIAGNOSIS — C320 Malignant neoplasm of glottis: Secondary | ICD-10-CM | POA: Diagnosis not present

## 2018-12-02 ENCOUNTER — Other Ambulatory Visit (HOSPITAL_COMMUNITY): Payer: Medicare Other

## 2018-12-02 ENCOUNTER — Ambulatory Visit (HOSPITAL_COMMUNITY): Payer: Medicare Other

## 2018-12-03 DIAGNOSIS — R490 Dysphonia: Secondary | ICD-10-CM | POA: Diagnosis not present

## 2018-12-03 DIAGNOSIS — R197 Diarrhea, unspecified: Secondary | ICD-10-CM | POA: Diagnosis not present

## 2018-12-03 DIAGNOSIS — C329 Malignant neoplasm of larynx, unspecified: Secondary | ICD-10-CM | POA: Diagnosis not present

## 2018-12-03 DIAGNOSIS — Z483 Aftercare following surgery for neoplasm: Secondary | ICD-10-CM | POA: Diagnosis not present

## 2018-12-08 ENCOUNTER — Ambulatory Visit (HOSPITAL_COMMUNITY): Payer: Medicare Other | Admitting: Hematology

## 2018-12-14 DIAGNOSIS — Z448 Encounter for fitting and adjustment of other external prosthetic devices: Secondary | ICD-10-CM | POA: Diagnosis not present

## 2018-12-14 DIAGNOSIS — Z923 Personal history of irradiation: Secondary | ICD-10-CM | POA: Diagnosis not present

## 2018-12-14 DIAGNOSIS — Z87891 Personal history of nicotine dependence: Secondary | ICD-10-CM | POA: Diagnosis not present

## 2018-12-14 DIAGNOSIS — Z9002 Acquired absence of larynx: Secondary | ICD-10-CM | POA: Diagnosis not present

## 2018-12-14 DIAGNOSIS — C329 Malignant neoplasm of larynx, unspecified: Secondary | ICD-10-CM | POA: Diagnosis not present

## 2019-03-01 ENCOUNTER — Ambulatory Visit (HOSPITAL_COMMUNITY): Payer: Medicare Other | Admitting: Hematology

## 2019-03-01 ENCOUNTER — Other Ambulatory Visit (HOSPITAL_COMMUNITY): Payer: Medicare Other

## 2019-04-28 ENCOUNTER — Inpatient Hospital Stay (HOSPITAL_COMMUNITY): Payer: Medicare Other | Attending: Hematology

## 2019-04-28 ENCOUNTER — Other Ambulatory Visit: Payer: Self-pay

## 2019-04-28 DIAGNOSIS — Z79899 Other long term (current) drug therapy: Secondary | ICD-10-CM | POA: Diagnosis not present

## 2019-04-28 DIAGNOSIS — R630 Anorexia: Secondary | ICD-10-CM | POA: Insufficient documentation

## 2019-04-28 DIAGNOSIS — K5900 Constipation, unspecified: Secondary | ICD-10-CM | POA: Insufficient documentation

## 2019-04-28 DIAGNOSIS — Z923 Personal history of irradiation: Secondary | ICD-10-CM | POA: Insufficient documentation

## 2019-04-28 DIAGNOSIS — Z905 Acquired absence of kidney: Secondary | ICD-10-CM | POA: Diagnosis not present

## 2019-04-28 DIAGNOSIS — I700 Atherosclerosis of aorta: Secondary | ICD-10-CM | POA: Insufficient documentation

## 2019-04-28 DIAGNOSIS — Z8546 Personal history of malignant neoplasm of prostate: Secondary | ICD-10-CM | POA: Diagnosis not present

## 2019-04-28 DIAGNOSIS — M549 Dorsalgia, unspecified: Secondary | ICD-10-CM | POA: Insufficient documentation

## 2019-04-28 DIAGNOSIS — E039 Hypothyroidism, unspecified: Secondary | ICD-10-CM | POA: Diagnosis not present

## 2019-04-28 DIAGNOSIS — R200 Anesthesia of skin: Secondary | ICD-10-CM | POA: Insufficient documentation

## 2019-04-28 DIAGNOSIS — F17210 Nicotine dependence, cigarettes, uncomplicated: Secondary | ICD-10-CM | POA: Insufficient documentation

## 2019-04-28 DIAGNOSIS — I2510 Atherosclerotic heart disease of native coronary artery without angina pectoris: Secondary | ICD-10-CM | POA: Insufficient documentation

## 2019-04-28 DIAGNOSIS — C320 Malignant neoplasm of glottis: Secondary | ICD-10-CM | POA: Insufficient documentation

## 2019-04-28 DIAGNOSIS — F329 Major depressive disorder, single episode, unspecified: Secondary | ICD-10-CM | POA: Diagnosis not present

## 2019-04-28 DIAGNOSIS — M1990 Unspecified osteoarthritis, unspecified site: Secondary | ICD-10-CM | POA: Diagnosis not present

## 2019-04-28 LAB — CBC WITH DIFFERENTIAL/PLATELET
Abs Immature Granulocytes: 0.02 10*3/uL (ref 0.00–0.07)
Basophils Absolute: 0 10*3/uL (ref 0.0–0.1)
Basophils Relative: 1 %
Eosinophils Absolute: 0.1 10*3/uL (ref 0.0–0.5)
Eosinophils Relative: 3 %
HCT: 49 % (ref 39.0–52.0)
Hemoglobin: 15.3 g/dL (ref 13.0–17.0)
Immature Granulocytes: 0 %
Lymphocytes Relative: 12 %
Lymphs Abs: 0.6 10*3/uL — ABNORMAL LOW (ref 0.7–4.0)
MCH: 29.3 pg (ref 26.0–34.0)
MCHC: 31.2 g/dL (ref 30.0–36.0)
MCV: 93.9 fL (ref 80.0–100.0)
Monocytes Absolute: 0.4 10*3/uL (ref 0.1–1.0)
Monocytes Relative: 8 %
Neutro Abs: 3.5 10*3/uL (ref 1.7–7.7)
Neutrophils Relative %: 76 %
Platelets: 110 10*3/uL — ABNORMAL LOW (ref 150–400)
RBC: 5.22 MIL/uL (ref 4.22–5.81)
RDW: 13.2 % (ref 11.5–15.5)
WBC: 4.7 10*3/uL (ref 4.0–10.5)
nRBC: 0 % (ref 0.0–0.2)

## 2019-04-28 LAB — COMPREHENSIVE METABOLIC PANEL
ALT: 14 U/L (ref 0–44)
AST: 20 U/L (ref 15–41)
Albumin: 4 g/dL (ref 3.5–5.0)
Alkaline Phosphatase: 93 U/L (ref 38–126)
Anion gap: 15 (ref 5–15)
BUN: 27 mg/dL — ABNORMAL HIGH (ref 8–23)
CO2: 22 mmol/L (ref 22–32)
Calcium: 9.5 mg/dL (ref 8.9–10.3)
Chloride: 101 mmol/L (ref 98–111)
Creatinine, Ser: 1.2 mg/dL (ref 0.61–1.24)
GFR calc Af Amer: >60 mL/min (ref >60)
GFR calc non Af Amer: >60 mL/min (ref >60)
Glucose, Bld: 111 mg/dL — ABNORMAL HIGH (ref 70–99)
Potassium: 4 mmol/L (ref 3.5–5.1)
Sodium: 138 mmol/L (ref 135–145)
Total Bilirubin: 0.8 mg/dL (ref 0.3–1.2)
Total Protein: 7.4 g/dL (ref 6.5–8.1)

## 2019-04-28 LAB — PSA: Prostatic Specific Antigen: 0.09 ng/mL (ref 0.00–4.00)

## 2019-04-28 LAB — TSH: TSH: 10.779 u[IU]/mL — ABNORMAL HIGH (ref 0.350–4.500)

## 2019-05-05 ENCOUNTER — Other Ambulatory Visit (HOSPITAL_COMMUNITY): Payer: Self-pay | Admitting: *Deleted

## 2019-05-05 ENCOUNTER — Other Ambulatory Visit: Payer: Self-pay

## 2019-05-05 ENCOUNTER — Encounter (HOSPITAL_COMMUNITY): Payer: Self-pay | Admitting: Hematology

## 2019-05-05 ENCOUNTER — Inpatient Hospital Stay (HOSPITAL_BASED_OUTPATIENT_CLINIC_OR_DEPARTMENT_OTHER): Payer: Medicare Other | Admitting: Hematology

## 2019-05-05 VITALS — BP 157/92 | HR 93 | Temp 97.8°F | Resp 16

## 2019-05-05 DIAGNOSIS — F329 Major depressive disorder, single episode, unspecified: Secondary | ICD-10-CM

## 2019-05-05 DIAGNOSIS — E039 Hypothyroidism, unspecified: Secondary | ICD-10-CM

## 2019-05-05 DIAGNOSIS — Z923 Personal history of irradiation: Secondary | ICD-10-CM

## 2019-05-05 DIAGNOSIS — I2510 Atherosclerotic heart disease of native coronary artery without angina pectoris: Secondary | ICD-10-CM | POA: Diagnosis not present

## 2019-05-05 DIAGNOSIS — R200 Anesthesia of skin: Secondary | ICD-10-CM

## 2019-05-05 DIAGNOSIS — M1990 Unspecified osteoarthritis, unspecified site: Secondary | ICD-10-CM | POA: Diagnosis not present

## 2019-05-05 DIAGNOSIS — Z905 Acquired absence of kidney: Secondary | ICD-10-CM

## 2019-05-05 DIAGNOSIS — K5900 Constipation, unspecified: Secondary | ICD-10-CM | POA: Diagnosis not present

## 2019-05-05 DIAGNOSIS — I700 Atherosclerosis of aorta: Secondary | ICD-10-CM | POA: Diagnosis not present

## 2019-05-05 DIAGNOSIS — R630 Anorexia: Secondary | ICD-10-CM | POA: Diagnosis not present

## 2019-05-05 DIAGNOSIS — C320 Malignant neoplasm of glottis: Secondary | ICD-10-CM

## 2019-05-05 DIAGNOSIS — Z8546 Personal history of malignant neoplasm of prostate: Secondary | ICD-10-CM

## 2019-05-05 DIAGNOSIS — M549 Dorsalgia, unspecified: Secondary | ICD-10-CM | POA: Diagnosis not present

## 2019-05-05 DIAGNOSIS — Z79899 Other long term (current) drug therapy: Secondary | ICD-10-CM

## 2019-05-05 DIAGNOSIS — F17210 Nicotine dependence, cigarettes, uncomplicated: Secondary | ICD-10-CM

## 2019-05-05 MED ORDER — MIRTAZAPINE 15 MG PO TABS: 15.0000 mg | ORAL_TABLET | Freq: Every day | ORAL | 2 refills | Status: DC

## 2019-05-05 MED ORDER — LEVOTHYROXINE SODIUM 25 MCG PO TABS: 25.0000 ug | ORAL_TABLET | Freq: Every day | ORAL | 2 refills | Status: DC

## 2019-05-05 NOTE — Progress Notes (Signed)
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 There is probable supraglottic extension along the anterior false cords and suspected laryngeal cartilage erosion anteriorly at the level of of the commissure. No extension beyond the larynx, extension into the subglottic airway, or lymphadenopathy is identified.   08/06/2016 Imaging   CT chest- 1. Asymmetric interstitial changes involving the right lobe suggestive of interstitial lung disease. 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 A followup high-resolution CT of the chest is recommended in 6 months to assess for temporal change and provide a more detailed assessment of interstitial changes. 2. Enlarged mediastinal and right hilar lymph nodes. Nodes measure up to 1.5 cm which is considered pathologically enlarged by size criteria. In the setting of interstitial lung disease this is a nonspecific finding and may be reactive. 3. Aortic atherosclerosis, cardiac enlargement and multi vessel coronary artery calcification 4. Small pulmonary nodules are  identified in the right lung. The largest measures 6 mm. Attention at follow-up imaging advised.   08/15/2016 - 09/26/2016 Radiation Therapy   [name], [place]   12/27/2016 Imaging   CT chest high resolution- 1. Stable scattered pulmonary nodules, largest 6 mm, for which 4 month stability has been demonstrated. Recommend continued attention on follow-up chest CT in 6 months. 2. Interval stability of interstitial lung disease asymmetrically involving the right lung characterized by patchy subpleural reticulation and ground-glass attenuation with associated mild traction bronchiolectasis, no frank honeycombing and no clear basilar gradient. Findings are not appreciably changed at the lung bases compared to a 2015 CT abdomen study. 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 Follow-up high-resolution chest CT in 12 months recommended to ensure continued temporal pattern stability. 3. Stable mild mediastinal lymphadenopathy, most compatible with benign reactive adenopathy. 4. Aortic atherosclerosis. Left main and 3 vessel coronary atherosclerosis.   03/10/2017 PET scan   1. Accentuated activity at the glottic level posteriorly in the midline, maximum SUV 13.4, without well-defined CT correlate. Lesser activity along the arytenoid cartilage region, left greater than right. The patient's prior cancer was centered somewhat anteriorly along the anterior commissure, in this region does not appear differentially hypermetabolic on today's exam compared to the rest of the glottic region. Accordingly, the activity present today may simply be physiologic but forms a reasonable baseline for future comparisons. No adenopathy in the neck. 2. Mildly enlarged right paratracheal lymph node is mildly hypermetabolic with a maximum SUV of 5.4 (compared to background mediastinal blood pool activity level of 2.9). 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 Surveillance is likely warranted. There is some activity  slightly above background in the right hilum. 3. No hypermetabolic lung nodules are currently visible, including the 7 mm average size right lower lobe nodule adjacent to the major fissure. This is right at the borderline of sensitive PET-CT size thresholds. 4. 3.7 cm infrarenal abdominal aortic aneurysm. Recommend followup by [place] in 2 years. This recommendation follows ACR consensus guidelines: White Paper of the ACR Incidental Findings Committee II on Vascular Findings. J Am Coll Radiol 2013; 10:789-794. 5. Vaguely accentuated activity in the left side of the prostate gland. No pelvic adenopathy is observed. No hypermetabolic bony lesions. 6.  Aortic Atherosclerosis (ICD10-I70.0).  Coronary atherosclerosis. 7. Left nephrectomy.   06/12/2017 Imaging   CT Chest with contrast: IMPRESSION: 1. No definitive change in pulmonary nodules with the largest measuring 6 mm, taking difference in slice selection into account. Recommend continued attention on follow-up in 6 months. 2. Interstitial lung disease, unchanged in the interval. 3. A few mildly enlarged mediastinal nodes as above are nonspecific but may be reactive. 4. Aortic atherosclerosis, cardiac enlargement, and multi vessel coronary artery disease.  Aortic Atherosclerosis (ICD10-I70.0).   Electronically Signed   By: Dorise Bullion III M.D   On: 06/12/2017 15:27   06/12/2017 Imaging   CT soft tissue neck: IMPRESSION: 1. Decreased enhancement and mass effect anterior commissure region of larynx. No new exophytic lesion or locally invasive mass to suggest recurrent/residual disease. No cervical lymphadenopathy by imaging criteria. 2. Bilateral carotid bifurcation atherosclerotic disease, right greater than left. Given radiation treatment  to the neck, consider follow-up with carotid Doppler to ensure continued patency.   Electronically Signed   By: Kristine Garbe M.D.   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 male returns for follow-up after surgery with his daughter.  He underwent laryngectomy in the last week of January 2020.  He is drinking about Ensure 2-3 times per day.  Reports decrease in appetite.  Reports mild depression.  Numbness in the right side of the jaw present.  Occasional headaches is stable.  Is having trouble chewing solids.  Shortness of breath on exertion present.  He was inquiring if he can use Anoro for COPD.  Appetite is 25%.  Energy levels are low.   REVIEW OF SYSTEMS:  Review of Systems  HENT:   Positive for trouble swallowing.   Respiratory: Positive for shortness of breath.   Gastrointestinal: Positive for constipation.  Musculoskeletal: Positive for back pain.  Neurological: Positive for numbness.  All other systems reviewed and are negative.    PAST MEDICAL/SURGICAL HISTORY:  Past Medical History:  Diagnosis Date  . Arthritis   . Chronic back pain   . COPD (chronic obstructive pulmonary disease) ([facility])   . Hypercalcemia   . Kidney stones   . 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 Prostate cancer ([facility]) 08/11/2016  . Sepsis ([place])   . 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 UTI (lower urinary tract infection)    Past Surgical History:  Procedure Laterality Date  . APPENDECTOMY    . BACK SURGERY  10/2013  . GOLD SEED IMPLANT N/A 08/21/2016    Procedure: GOLD SEED IMPLANT;  Surgeon: Cleon Gustin, MD;  Location: [facility];  Service: Urology;  Laterality: N/A;  . HERNIA REPAIR    . MICROLARYNGOSCOPY Bilateral 07/23/2016   Procedure: MICRO DIRECT LARYNGOSCOPY WITH BIOPSY;  Surgeon: Leta Baptist, MD;  Location: [facility];  Service: ENT;  Laterality: Bilateral;  . MICROLARYNGOSCOPY Left 08/18/2018   Procedure: DIRECT MICROLARYNGOSCOPY with biopsy of left vocal cord mass;  Surgeon: Leta Baptist, MD;  Location: [facility];  Service: ENT;  Laterality: Left;  . NEPHRECTOMY Left    04/2014  . PROSTATE BIOPSY N/A 06/05/2016   Procedure: PROSTATE BIOPSY;  Surgeon: Cleon Gustin, MD;  Location: [facility];  Service: Urology;  Laterality: N/A;  . TRANSRECTAL ULTRASOUND N/A 08/21/2016   Procedure: TRANSRECTAL ULTRASOUND;  Surgeon: Cleon Gustin, MD;  Location: [facility];  Service: Urology;  Laterality: N/A;  . vocal cord biopsy  06/2016     SOCIAL HISTORY:  Social History   Socioeconomic History  . Marital status: Married    Spouse name: Not on file  . Number of children: Not on file  . Years of education: Not on file  . Highest education level: Not on file  Occupational History  . Not on file  Social Needs  . Financial resource strain: Not on file  . Food insecurity    Worry: Not on file    Inability: Not on file  . Transportation needs    Medical: Not on file    Non-medical: Not on file  Tobacco Use  . Smoking status: Current Every Day Smoker    Packs/day: 1.00    Years: 50.00    Pack years: 50.00    Types: Cigarettes    Last attempt to quit: 08/24/2017    Years since quitting: 1.6  . Smokeless tobacco: Never Used  Substance and Sexual Activity  . Alcohol use: No  . Drug use: No  . Sexual activity: Yes  Lifestyle  . Physical activity    Days per week: Not on file    Minutes per session: Not on file  . Stress: Not on file  Relationships  . Social Herbalist on phone: Not on file     Gets together: Not on file    Attends religious service: Not on file    Active member of club or organization: Not on file    Attends meetings of clubs or organizations: Not on file    Relationship status: Not on file  . Intimate partner violence    Fear of current or ex partner: Not on file    Emotionally abused: Not on file    Physically abused: Not on file    Forced sexual activity: Not on file  Other Topics Concern  . Not on file  Social History Narrative  . Not on file    FAMILY HISTORY:  Family History  Problem Relation Age of Onset  . Cancer Mother   . Cancer Brother     CURRENT MEDICATIONS:  Outpatient Encounter Medications as of 05/05/2019  Medication Sig Note  . acetaminophen (TYLENOL) 500 MG tablet Take 500 mg by mouth every 6 (six) hours as needed (for pain.).   Marland Kitchen alendronate (FOSAMAX) 70 MG tablet TAKE 1 TABLET BY MOUTH ONE TIME PER WEEK   . atorvastatin (LIPITOR) 20 MG tablet Take 20 mg by mouth daily.   Marland Kitchen levothyroxine (SYNTHROID) 25  MCG tablet Take 1 tablet (25 mcg total) by mouth daily before breakfast.   . mirtazapine (REMERON) 15 MG tablet Take 1 tablet (15 mg total) by mouth at bedtime.   . nitrofurantoin, macrocrystal-monohydrate, (MACROBID) 100 MG capsule Take 1 capsule (100 mg total) by mouth 2 (two) times daily. (Patient taking differently: Take 100 mg by mouth daily. )   . ondansetron (ZOFRAN) 4 MG tablet Take 4 mg by mouth every 8 (eight) hours as needed for nausea or vomiting.  11/04/2016: Received from: External Pharmacy  . Oxycodone HCl 10 MG TABS Take 10 mg by mouth as needed (pain). Per pt, every 4 hrs   . polyethylene glycol (MIRALAX / GLYCOLAX) packet Take 17 g by mouth daily.    . Potassium 75 MG TABS Take 99 mg by mouth.   . [DISCONTINUED] CARAFATE 1 GM/10ML suspension    . [DISCONTINUED] celecoxib (CELEBREX) 200 MG capsule    . [DISCONTINUED] Melatonin 5 MG TABS Take 5 mg by mouth at bedtime as needed (for sleep).   . [DISCONTINUED] traMADol  (ULTRAM) 50 MG tablet    . [DISCONTINUED] traZODone (DESYREL) 50 MG tablet     No facility-administered encounter medications on file as of 05/05/2019.     ALLERGIES:  No Known Allergies   PHYSICAL EXAM:  ECOG Performance status: 1  Vitals:   05/05/19 1459  BP: (!) 157/92  Pulse: 93  Resp: 16  Temp: 97.8 F (36.6 C)  SpO2: 99%   There were no vitals filed for this visit.  Physical Exam Constitutional:      Appearance: Normal appearance. He is normal weight.  Cardiovascular:     Rate and Rhythm: Normal rate and regular rhythm.     Heart sounds: Normal heart sounds.  Pulmonary:     Effort: Pulmonary effort is normal.     Breath sounds: Normal breath sounds.  Musculoskeletal:     Comments: wheelchair  Skin:    General: Skin is warm and dry.  Neurological:     Mental Status: He is alert and oriented to person, place, and time. Mental status is at baseline.  Psychiatric:        Mood and Affect: Mood normal.        Behavior: Behavior normal.        Thought Content: Thought content normal.        Judgment: Judgment normal.      LABORATORY DATA:  I have reviewed the labs as listed.  CBC    Component Value Date/Time   WBC 4.7 04/28/2019 1356   RBC 5.22 04/28/2019 1356   HGB 15.3 04/28/2019 1356   HCT 49.0 04/28/2019 1356   PLT 110 (L) 04/28/2019 1356   MCV 93.9 04/28/2019 1356   MCH 29.3 04/28/2019 1356   MCHC 31.2 04/28/2019 1356   RDW 13.2 04/28/2019 1356   LYMPHSABS 0.6 (L) 04/28/2019 1356   MONOABS 0.4 04/28/2019 1356   EOSABS 0.1 04/28/2019 1356   BASOSABS 0.0 04/28/2019 1356   CMP Latest Ref Rng & Units 04/28/2019 06/04/2018 03/25/2018  Glucose 70 - 99 mg/dL 111(H) 133(H) 137(H)  BUN 8 - 23 mg/dL 27(H) 27(H) 25(H)  Creatinine 0.61 - 1.24 mg/dL 1.20 1.30(H) 1.06  Sodium 135 - 145 mmol/L 138 135 138  Potassium 3.5 - 5.1 mmol/L 4.0 4.3 4.5  Chloride 98 - 111 mmol/L 101 103 107  CO2 22 - 32 mmol/L 22 26 21(L)  Calcium 8.9 - 10.3 mg/dL 9.5 10.7(H) 10.7(H)   Total Protein  6.5 - 8.1 g/dL 7.4 6.7 7.2  Total Bilirubin 0.3 - 1.2 mg/dL 0.8 1.8(H) 1.3(H)  Alkaline Phos 38 - 126 U/L 93 81 98  AST 15 - 41 U/L 20 20 25   ALT 0 - 44 U/L 14 30 24        DIAGNOSTIC IMAGING:  I have independently reviewed the scans and discussed with the patient.   I have reviewed Francene Finders, NP's note and agree with the documentation.  I personally performed a face-to-face visit, made revisions and my assessment and plan is as follows.    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  Recurrent laryngeal cancer: - Stage II (T2N0) squamous cell carcinoma involving both vocal cords diagnosed in September 2017, left more than right, involvement of the anterior commissure, underwent definitive radiation therapy. - Direct laryngoscopy on 08/18/2018 showed ulcerated mass on the left vocal cord, biopsy consistent with squamous cell carcinoma. -PET scan on 09/23/2018 showed hypermetabolic him in the anterior glottis without discrete mass.  No neck adenopathy. - On 11/19/2018 he underwent MicroDirect laryngoscopy, bilateral modified radical neck dissection, total laryngectomy. -Pathology consistent with PT3N0 moderately differentiated squamous cell carcinoma, margins negative. - He has a follow-up appointment with Dr. Conley Canal end of this month. - I plan to see him back in 2 months for follow-up.  2.  Hypothyroidism: -His TSH is 10.7. -I will start him on Synthroid 25 mcg daily.  He will take it on empty stomach.  3.  Loss of appetite: -He reports no appetite.  He also has mild depression. -I will start him on Remeron 15 mg at bedtime.  We will increase it as needed.  4.  Prostate cancer: -He was treated with radiation therapy completed in January 2018. - PSA on 04/28/2019 was 0.09.   Total time spent is 25 minutes with more than 50% of the time spent face-to-face discussing surveillance plan and counseling and coordination of care..   Orders placed this encounter:  Orders Placed This Encounter  Procedures  . CBC with Differential/Platelet  . Comprehensive metabolic panel  . TSH      Derek Jack, [employer] [phone]

## 2019-05-05 NOTE — Patient Instructions (Addendum)
[facility] at [facility] Discharge Instructions  You were seen today by Dr. Katragadda. He went over your recent lab results. He will see you back in 2 months for labs and follow up.   Thank you for choosing [facility] at [facility] to provide your oncology and hematology care.  To afford each patient quality time with our provider, please arrive at least 15 minutes before your scheduled appointment time.   If you have a lab appointment with the Cancer Center please come in thru the  Main Entrance and check in at the main information desk  You need to re-schedule your appointment should you arrive 10 or more minutes late.  We strive to give you quality time with our providers, and arriving late affects you and other patients whose appointments are after yours.  Also, if you no show three or more times for appointments you may be dismissed from the clinic at the providers discretion.     Again, thank you for choosing [facility].  Our hope is that these requests will decrease the amount of time that you wait before being seen by our physicians.       _____________________________________________________________  Should you have questions after your visit to  Cancer Center, please contact our office at [phone] between the hours of 8:00 a.m. and 4:30 p.m.  Voicemails left after 4:00 p.m. will not be returned until the following business day.  For prescription refill requests, have your pharmacy contact our office and allow 72 hours.    Cancer Center Support Programs:   > Cancer Support Group  2nd Tuesday of the month 1pm-2pm, Journey Room    

## 2019-05-05 NOTE — Assessment & Plan Note (Addendum)
1.  Recurrent laryngeal cancer: - Stage II (T2N0) squamous cell carcinoma involving both vocal cords diagnosed in September 2017, left more than right, involvement of the anterior commissure, underwent definitive radiation therapy. - Direct laryngoscopy on 08/18/2018 showed ulcerated mass on the left vocal cord, biopsy consistent with squamous cell carcinoma. -PET scan on 09/23/2018 showed hypermetabolic him in the anterior glottis without discrete mass.  No neck adenopathy. - On 11/19/2018 he underwent MicroDirect laryngoscopy, bilateral modified radical neck dissection, total laryngectomy. -Pathology consistent with PT3N0 moderately differentiated squamous cell carcinoma, margins negative. - He has a follow-up appointment with Dr. Conley Canal end of this month. - I plan to see him back in 2 months for follow-up.  2.  Hypothyroidism: -His TSH is 10.7. -I will start him on Synthroid 25 mcg daily.  He will take it on empty stomach.  3.  Loss of appetite: -He reports no appetite.  He also has mild depression. -I will start him on Remeron 15 mg at bedtime.  We will increase it as needed.  4.  Prostate cancer: -He was treated with radiation therapy completed in January 2018. - PSA on 04/28/2019 was 0.09.

## 2019-05-19 DIAGNOSIS — M545 Low back pain: Secondary | ICD-10-CM | POA: Diagnosis not present

## 2019-05-19 DIAGNOSIS — J449 Chronic obstructive pulmonary disease, unspecified: Secondary | ICD-10-CM | POA: Diagnosis not present

## 2019-05-19 DIAGNOSIS — C329 Malignant neoplasm of larynx, unspecified: Secondary | ICD-10-CM | POA: Diagnosis not present

## 2019-05-19 DIAGNOSIS — C61 Malignant neoplasm of prostate: Secondary | ICD-10-CM | POA: Diagnosis not present

## 2019-05-29 ENCOUNTER — Other Ambulatory Visit (HOSPITAL_COMMUNITY): Payer: Self-pay | Admitting: Hematology

## 2019-05-29 DIAGNOSIS — E039 Hypothyroidism, unspecified: Secondary | ICD-10-CM

## 2019-07-07 ENCOUNTER — Inpatient Hospital Stay (HOSPITAL_COMMUNITY): Payer: Medicare Other

## 2019-07-08 ENCOUNTER — Inpatient Hospital Stay (HOSPITAL_COMMUNITY): Payer: Medicare Other

## 2019-07-12 DIAGNOSIS — C329 Malignant neoplasm of larynx, unspecified: Secondary | ICD-10-CM | POA: Diagnosis not present

## 2019-07-12 DIAGNOSIS — M545 Low back pain: Secondary | ICD-10-CM | POA: Diagnosis not present

## 2019-07-12 DIAGNOSIS — C61 Malignant neoplasm of prostate: Secondary | ICD-10-CM | POA: Diagnosis not present

## 2019-07-12 DIAGNOSIS — J449 Chronic obstructive pulmonary disease, unspecified: Secondary | ICD-10-CM | POA: Diagnosis not present

## 2019-07-14 ENCOUNTER — Ambulatory Visit (HOSPITAL_COMMUNITY): Payer: Medicare Other | Admitting: Hematology

## 2019-07-27 DIAGNOSIS — R0989 Other specified symptoms and signs involving the circulatory and respiratory systems: Secondary | ICD-10-CM | POA: Diagnosis not present

## 2019-07-27 DIAGNOSIS — R42 Dizziness and giddiness: Secondary | ICD-10-CM | POA: Diagnosis not present

## 2019-07-27 DIAGNOSIS — R0602 Shortness of breath: Secondary | ICD-10-CM | POA: Diagnosis not present

## 2019-07-27 DIAGNOSIS — R0789 Other chest pain: Secondary | ICD-10-CM | POA: Diagnosis not present

## 2019-07-27 DIAGNOSIS — F419 Anxiety disorder, unspecified: Secondary | ICD-10-CM | POA: Diagnosis not present

## 2019-07-27 DIAGNOSIS — R9431 Abnormal electrocardiogram [ECG] [EKG]: Secondary | ICD-10-CM | POA: Diagnosis not present

## 2019-07-27 DIAGNOSIS — F411 Generalized anxiety disorder: Secondary | ICD-10-CM | POA: Diagnosis not present

## 2019-07-28 ENCOUNTER — Other Ambulatory Visit (HOSPITAL_COMMUNITY): Payer: Self-pay | Admitting: Hematology

## 2019-07-28 DIAGNOSIS — E039 Hypothyroidism, unspecified: Secondary | ICD-10-CM

## 2019-08-10 DIAGNOSIS — H524 Presbyopia: Secondary | ICD-10-CM | POA: Diagnosis not present

## 2019-08-10 DIAGNOSIS — H2513 Age-related nuclear cataract, bilateral: Secondary | ICD-10-CM | POA: Diagnosis not present

## 2019-08-11 DIAGNOSIS — C61 Malignant neoplasm of prostate: Secondary | ICD-10-CM | POA: Diagnosis not present

## 2019-08-11 DIAGNOSIS — J449 Chronic obstructive pulmonary disease, unspecified: Secondary | ICD-10-CM | POA: Diagnosis not present

## 2019-08-11 DIAGNOSIS — M549 Dorsalgia, unspecified: Secondary | ICD-10-CM | POA: Diagnosis not present

## 2019-08-11 DIAGNOSIS — C329 Malignant neoplasm of larynx, unspecified: Secondary | ICD-10-CM | POA: Diagnosis not present

## 2019-08-24 DIAGNOSIS — R05 Cough: Secondary | ICD-10-CM | POA: Diagnosis not present

## 2019-08-24 DIAGNOSIS — R935 Abnormal findings on diagnostic imaging of other abdominal regions, including retroperitoneum: Secondary | ICD-10-CM | POA: Diagnosis not present

## 2019-08-24 DIAGNOSIS — R531 Weakness: Secondary | ICD-10-CM | POA: Diagnosis not present

## 2019-08-24 DIAGNOSIS — K5900 Constipation, unspecified: Secondary | ICD-10-CM | POA: Diagnosis not present

## 2019-08-24 DIAGNOSIS — E860 Dehydration: Secondary | ICD-10-CM | POA: Diagnosis not present

## 2019-08-24 DIAGNOSIS — Z5320 Procedure and treatment not carried out because of patient's decision for unspecified reasons: Secondary | ICD-10-CM | POA: Diagnosis not present

## 2019-08-24 DIAGNOSIS — R9431 Abnormal electrocardiogram [ECG] [EKG]: Secondary | ICD-10-CM | POA: Diagnosis not present

## 2019-08-24 DIAGNOSIS — I517 Cardiomegaly: Secondary | ICD-10-CM | POA: Diagnosis not present

## 2019-08-24 DIAGNOSIS — R110 Nausea: Secondary | ICD-10-CM | POA: Diagnosis not present

## 2019-08-24 DIAGNOSIS — R778 Other specified abnormalities of plasma proteins: Secondary | ICD-10-CM | POA: Diagnosis not present

## 2019-08-25 DIAGNOSIS — I517 Cardiomegaly: Secondary | ICD-10-CM | POA: Diagnosis not present

## 2019-09-15 DIAGNOSIS — J449 Chronic obstructive pulmonary disease, unspecified: Secondary | ICD-10-CM | POA: Diagnosis not present

## 2019-09-15 DIAGNOSIS — Z79899 Other long term (current) drug therapy: Secondary | ICD-10-CM | POA: Diagnosis not present

## 2019-09-15 DIAGNOSIS — E039 Hypothyroidism, unspecified: Secondary | ICD-10-CM | POA: Diagnosis not present

## 2019-09-15 DIAGNOSIS — Z8521 Personal history of malignant neoplasm of larynx: Secondary | ICD-10-CM | POA: Diagnosis not present

## 2019-09-15 DIAGNOSIS — Z23 Encounter for immunization: Secondary | ICD-10-CM | POA: Diagnosis not present

## 2019-09-15 DIAGNOSIS — M545 Low back pain: Secondary | ICD-10-CM | POA: Diagnosis not present

## 2019-09-15 DIAGNOSIS — E782 Mixed hyperlipidemia: Secondary | ICD-10-CM | POA: Diagnosis not present

## 2019-09-16 DIAGNOSIS — Z79899 Other long term (current) drug therapy: Secondary | ICD-10-CM | POA: Diagnosis not present

## 2019-10-01 DIAGNOSIS — M545 Low back pain: Secondary | ICD-10-CM | POA: Diagnosis not present

## 2019-10-01 DIAGNOSIS — J449 Chronic obstructive pulmonary disease, unspecified: Secondary | ICD-10-CM | POA: Diagnosis not present

## 2019-10-01 DIAGNOSIS — Z8546 Personal history of malignant neoplasm of prostate: Secondary | ICD-10-CM | POA: Diagnosis not present

## 2019-10-01 DIAGNOSIS — G894 Chronic pain syndrome: Secondary | ICD-10-CM | POA: Diagnosis not present

## 2019-10-01 DIAGNOSIS — Z8521 Personal history of malignant neoplasm of larynx: Secondary | ICD-10-CM | POA: Diagnosis not present

## 2019-10-19 ENCOUNTER — Other Ambulatory Visit (HOSPITAL_COMMUNITY): Payer: Self-pay | Admitting: Hematology

## 2019-10-19 DIAGNOSIS — E039 Hypothyroidism, unspecified: Secondary | ICD-10-CM

## 2019-11-24 DIAGNOSIS — M545 Low back pain: Secondary | ICD-10-CM | POA: Diagnosis not present

## 2019-12-17 ENCOUNTER — Other Ambulatory Visit (HOSPITAL_COMMUNITY): Payer: Self-pay | Admitting: Nurse Practitioner

## 2019-12-17 DIAGNOSIS — E039 Hypothyroidism, unspecified: Secondary | ICD-10-CM

## 2019-12-23 DIAGNOSIS — M5137 Other intervertebral disc degeneration, lumbosacral region: Secondary | ICD-10-CM | POA: Diagnosis not present

## 2019-12-23 DIAGNOSIS — C61 Malignant neoplasm of prostate: Secondary | ICD-10-CM | POA: Diagnosis not present

## 2019-12-23 DIAGNOSIS — M810 Age-related osteoporosis without current pathological fracture: Secondary | ICD-10-CM | POA: Diagnosis not present

## 2019-12-23 DIAGNOSIS — N390 Urinary tract infection, site not specified: Secondary | ICD-10-CM | POA: Diagnosis not present

## 2020-01-19 DIAGNOSIS — M5137 Other intervertebral disc degeneration, lumbosacral region: Secondary | ICD-10-CM | POA: Diagnosis not present

## 2020-02-22 DIAGNOSIS — M5137 Other intervertebral disc degeneration, lumbosacral region: Secondary | ICD-10-CM | POA: Diagnosis not present

## 2020-02-22 DIAGNOSIS — Z8521 Personal history of malignant neoplasm of larynx: Secondary | ICD-10-CM | POA: Diagnosis not present

## 2020-02-22 DIAGNOSIS — C61 Malignant neoplasm of prostate: Secondary | ICD-10-CM | POA: Diagnosis not present

## 2021-10-28 DEATH — deceased
